# Patient Record
Sex: Male | Born: 1990 | Race: Black or African American | Marital: Married
Health system: Northeastern US, Academic
[De-identification: ages and names within clinical notes are randomized; demographics above are authoritative.]

## PROBLEM LIST (undated history)

## (undated) DIAGNOSIS — Z87891 Personal history of nicotine dependence: Secondary | ICD-10-CM

## (undated) DIAGNOSIS — J4550 Severe persistent asthma, uncomplicated: Secondary | ICD-10-CM

## (undated) DIAGNOSIS — F1210 Cannabis abuse, uncomplicated: Secondary | ICD-10-CM

## (undated) DIAGNOSIS — J45909 Unspecified asthma, uncomplicated: Secondary | ICD-10-CM

## (undated) DIAGNOSIS — J40 Bronchitis, not specified as acute or chronic: Secondary | ICD-10-CM

## (undated) DIAGNOSIS — J309 Allergic rhinitis, unspecified: Secondary | ICD-10-CM

## (undated) NOTE — ED Provider Notes (Signed)
Formatting of this note is different from the original.  Images from the original note were not included.    [place] [facility]    CSN: [id number]  Arrival date & time: 10/07/22  1757        History    Chief Complaint  Chief Complaint   Patient presents with    Exposure to STD     HPI  Wesley Wesley [name] is a 1 y.o. male.   Presents for STD testing  Denies any symptoms Wesley [name] exposures  No penile discharge, testicular pain Wesley [name] swelling, urinary symptoms    Past Medical History:   Diagnosis Date    Allergic rhinitis     Bronchitis     Hx of tobacco use, presenting hazards to health     Marijuana abuse     Severe persistent asthma      Patient Active Problem List    Diagnosis Date Noted    Enlarged thyroid 10/05/2022    Elevated blood pressure reading 10/05/2022    Asthma 09/07/2022    Non compliance with medical treatment 12/12/2016    Sinus tachycardia 09/15/2012    Marijuana abuse     Hx of tobacco use, presenting hazards to health      Past Surgical History:   Procedure Laterality Date    TONSILLECTOMY  2008     Home Medications      Prior to Admission medications    Medication Sig Start Date End Date Taking? Authorizing Provider   albuterol (PROVENTIL) (2.5 MG/3ML) 0.083% nebulizer solution Take 3 mLs (2.5 mg total) by nebulization every 6 (six) hours as needed for wheezing Wesley [name] shortness of breath. 09/07/22   McElwee, Lauren A, NP   albuterol (VENTOLIN HFA) 108 (90 Base) MCG/ACT inhaler Inhale 2 puffs into the lungs every 6 (six) hours as needed for wheezing Wesley [name] shortness of breath. 09/07/22   McElwee, Lauren A, NP   beclomethasone (QVAR REDIHALER) 80 MCG/ACT inhaler Inhale 2 puffs into the lungs 2 (two) times daily. 09/07/22   McElwee, Lauren A, NP   triamcinolone cream (KENALOG) 0.1 % Apply 1 Application topically 2 (two) times daily. 09/07/22   McElwee, Scheryl Darter, NP   promethazine (PHENERGAN) 25 MG tablet Take 1 tablet (25 mg total) by mouth every 6 (six) hours as needed for nausea Wesley [name] vomiting. 09/23/17 12/25/19  Orpah Greek, MD     Family History  Family History   Problem Relation Age of Onset    Asthma Mother     Cancer Mother         unsure    Asthma Brother      Social History  Social History     Tobacco Use    Smoking status: Never    Smokeless tobacco: Never   Vaping Use    Vaping Use: Never used   Substance Use Topics    Alcohol use: Not Currently    Drug use: Not Currently     Types: Marijuana     Comment: occasionally     Allergies    Patient has no known allergies.    Review of Systems  Review of Systems  Per HPI    Physical Exam  Triage Vital Signs  ED Triage Vitals [10/07/22 1910]   Enc Vitals Group      BP (!) 142/92      Pulse Rate 75      Resp 18      Temp 97.9 F (36.6 C)  Temp Source Oral      SpO2 98 %      Weight       Height       Head Circumference       Peak Flow       Pain Score       Pain Loc       Pain Edu?       Excl. in [place]?      No data found.    Updated Vital Signs  BP (!) 142/92 (BP Location: Left Arm)   Pulse 75   Temp 97.9 F (36.6 C) (Oral)   Resp 18   SpO2 98%     Physical Exam  Vitals and nursing note reviewed.   Constitutional:       General: He is not in acute distress.     Appearance: Normal appearance.   HENT:      Mouth/Throat:      Pharynx: Oropharynx is clear.   Cardiovascular:      Rate and Rhythm: Normal rate and regular rhythm.      Pulses: Normal pulses.   Pulmonary:      Effort: Pulmonary effort is normal.   Neurological:      Mental Status: He is alert and oriented to person, place, and time.     UC Treatments / Results   Labs  (all labs ordered are listed, but only abnormal results are displayed)  Labs Reviewed   CYTOLOGY, (ORAL, ANAL, URETHRAL) ANCILLARY ONLY     EKG    Radiology  No results found.    Procedures  Procedures (including critical care time)    Medications Ordered in UC  Medications - No data to display    Initial Impression / Assessment and Plan / UC Course   I have reviewed the triage vital signs and the nursing notes.    Pertinent labs & imaging  results that were available during my care of the patient were reviewed by me and considered in my medical decision making (see chart for details).    Cytology swab pending.   Will treat positive result as needed  No questions Wesley [name] concerns today    Final Clinical Impressions(s) / UC Diagnoses     Final diagnoses:   Screen for STD (sexually transmitted disease)     Discharge Instructions        We will call you if anything on your swab returns positive. Please abstain from sexual intercourse until your results return.    ED Prescriptions    None      PDMP not reviewed this encounter.    Rising, Vernice Jefferson  10/07/22 1955    Electronically signed by Les Pou, PA-C at 10/07/2022  7:55 PM EST

## (undated) NOTE — ED Provider Notes (Signed)
Formatting of this note is different from the original.    MRN: [id number] DOB: 14-Jul-1991    Subjective:     Wesley [name] is a 11 y.o. male presenting for an STI check. He does have unprotected sex. Denies fever, dysuria, hematuria, urinary frequency, penile discharge, penile swelling, testicular pain, testicular swelling, anal pain, groin pain.     No current facility-administered medications for this encounter.     Current Outpatient Medications:   ?  albuterol (PROVENTIL HFA;VENTOLIN HFA) 108 (90 Base) MCG/ACT inhaler, Inhale 2 puffs every 6 (six) hours as needed into the lungs for wheezing or shortness of breath., Disp: 1 Inhaler, Rfl: 0  ?  albuterol (PROVENTIL) (2.5 MG/3ML) 0.083% nebulizer solution, Take 3 mLs (2.5 mg total) every 6 (six) hours as needed by nebulization for wheezing., Disp: 75 mL, Rfl: 0  ?  budesonide (PULMICORT) 0.25 MG/2ML nebulizer solution, Take 2 mLs (0.25 mg total) by nebulization 2 (two) times daily., Disp: 60 mL, Rfl: 12  ?  ibuprofen (ADVIL,MOTRIN) 600 MG tablet, Take 1 tablet (600 mg total) every 6 (six) hours as needed by mouth., Disp: 30 tablet, Rfl: 0  ?  predniSONE (DELTASONE) 10 MG tablet, Prednisone 40 mg po daily x 1 day then Prednisone 30 mg po daily x 1 day then Prednisone 20 mg po daily x 1 day then Prednisone 10 mg daily x 1 day then stop... (Patient not taking: Reported on 10/08/2017), Disp: 10 tablet, Rfl: 0     No Known Allergies    Past Medical History:   Diagnosis Date   ? Allergic rhinitis    ? Bronchitis    ? Hx of tobacco use, presenting hazards to health    ? Marijuana abuse    ? Severe persistent asthma        Past Surgical History:   Procedure Laterality Date   ? TONSILLECTOMY  2008     Objective:     Vitals:  BP (!) 152/99 (BP Location: Left Arm)   Pulse 87   Temp 98.1 F (36.7 C) (Oral)   Resp 18   SpO2 99%     Physical Exam   Constitutional: He is oriented to person, place, and time. He appears well-developed and well-nourished.   Cardiovascular:  Normal rate.   Pulmonary/Chest: Effort normal.   Neurological: He is alert and oriented to person, place, and time.     Assessment and Plan :     Unprotected sex    Routine screening for STI (sexually transmitted infection)    STI labs pending. Patient is asymptomatic, will hold off on empiric treatment.      Jaynee Eagles, PA-C  05/07/18 1318    Electronically signed by Jaynee Eagles, PA-C at 05/07/2018  1:18 PM EDT

## (undated) NOTE — ED Triage Notes (Signed)
Formatting of this note might be different from the original.  Pt is here for his monthly STD check-up.  Denies any symptoms at this time.   Electronically signed by Phillip Heal, Demetrice A, CMA at 10/07/2022  7:13 PM EST

## (undated) NOTE — Progress Notes (Signed)
Formatting of this note is different from the original.    Virtual Visit Consent     Wesley [name], you are scheduled for a virtual visit with a [facility] provider today. Just as with appointments in the office, your consent must be obtained to participate. Your consent will be active for this visit and any virtual visit you may have with one of our providers in the next 365 days. If you have a MyChart account, a copy of this consent can be sent to you electronically.    As this is a virtual visit, video technology does not allow for your provider to perform a traditional examination. This may limit your provider's ability to fully assess your condition. If your provider identifies any concerns that need to be evaluated in person or the need to arrange testing (such as labs, EKG, etc.), we will make arrangements to do so. Although advances in technology are sophisticated, we cannot ensure that it will always work on either your end or our end. If the connection with a video visit is poor, the visit may have to be switched to a telephone visit. With either a video or telephone visit, we are not always able to ensure that we have a secure connection.    By engaging in this virtual visit, you consent to the provision of healthcare and authorize for your insurance to be billed (if applicable) for the services provided during this visit. Depending on your insurance coverage, you may receive a charge related to this service.    I need to obtain your verbal consent now. Are you willing to proceed with your visit today? Wesley [name] has provided verbal consent on 12/22/2022 for a virtual visit (video or telephone). Wesley Pounds, NP    Date: 12/22/2022 5:49 PM    Virtual Visit via Video Note     I, Wesley [name], connected with  Wesley [name]  ([id number], 06/19/1991) on 12/22/22 at  5:30 PM EST by a video-enabled telemedicine application and verified that I am speaking with the correct person using two  identifiers.    Location:  Patient: Virtual Visit Location Patient: Home  Provider: Virtual Visit Location Provider: Home Office    I discussed the limitations of evaluation and management by telemedicine and the availability of in person appointments. The patient expressed understanding and agreed to proceed.      History of Present Illness:  Wesley [name] is a 23 y.o. who identifies as a male who was assigned male at birth, and is being seen today for COVID symptoms.    Wesley [name] was diagnosed with COVID 3 days ago. He is currently  beint treated with Paxlovid but is requesting a work note due to persistent body aches mostly in his legs. Other symptoms include fatigue, chills and chest congestion. I have instructed him we will provide a note for his next 2 scheduled work days. Any additional days off would need to be approved by his PCP.       Problems:   Patient Active Problem List    Diagnosis Date Noted    Enlarged thyroid 10/05/2022    Elevated blood pressure reading 10/05/2022    Asthma 09/07/2022    Non compliance with medical treatment 12/12/2016    Sinus tachycardia 09/15/2012    Marijuana abuse     Hx of tobacco use, presenting hazards to health      Allergies: No Known Allergies  Medications:   Current Outpatient Medications:  albuterol (PROVENTIL) (2.5 MG/3ML) 0.083% nebulizer solution, Take 3 mLs (2.5 mg total) by nebulization every 6 (six) hours as needed for wheezing or shortness of breath., Disp: 75 mL, Rfl: 12    albuterol (VENTOLIN HFA) 108 (90 Base) MCG/ACT inhaler, Inhale 2 puffs into the lungs every 6 (six) hours as needed for wheezing or shortness of breath., Disp: 8 g, Rfl: 2    budesonide-formoterol (SYMBICORT) 160-4.5 MCG/ACT inhaler, Inhale 2 puffs into the lungs 2 (two) times daily., Disp: 1 each, Rfl: 12    nirmatrelvir/ritonavir (PAXLOVID) 20 x 150 MG & 10 x '100MG'$  TABS, Take 3 tablets by mouth 2 (two) times daily for 5 days. (Take nirmatrelvir 150 mg two tablets twice daily for 5 days  and ritonavir 100 mg one tablet twice daily for 5 days) Patient GFR is > 60, Disp: 30 tablet, Rfl: 0    Observations/Objective:  Patient is well-developed, well-nourished in no acute distress.   Resting comfortably at home.   Head is normocephalic, atraumatic.   No labored breathing.   Speech is clear and coherent with logical content.   Patient is alert and oriented at baseline.     Assessment and Plan:  1. COVID-19  INSTRUCTIONS: use a humidifier for nasal congestion  Drink plenty of fluids, rest and wash hands frequently to avoid the spread of infection  Alternate tylenol and Motrin for relief of fever and pain    Follow Up Instructions:  I discussed the assessment and treatment plan with the patient. The patient was provided an opportunity to ask questions and all were answered. The patient agreed with the plan and demonstrated an understanding of the instructions.  A copy of instructions were sent to the patient via MyChart unless otherwise noted below.     The patient was advised to call back or seek an in-person evaluation if the symptoms worsen or if the condition fails to improve as anticipated.    Time:   I spent 12 minutes with the patient via telehealth technology discussing the above problems/concerns.      Wesley Pounds, NP   Electronically signed by Wesley Pounds, NP at 12/22/2022  5:49 PM EST

## (undated) NOTE — ED Triage Notes (Signed)
Formatting of this note might be different from the original.  Pt requesting STD screening  Electronically signed by Nira Conn, RN at 05/07/2018 12:47 PM EDT

---
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
## 2008-07-20 ENCOUNTER — Emergency Department (HOSPITAL_COMMUNITY): Admission: EM | Admit: 2008-07-20 | Discharge: 2008-07-20 | Payer: Self-pay | Admitting: Emergency Medicine

## 2010-11-17 ENCOUNTER — Emergency Department (HOSPITAL_COMMUNITY)
Admission: EM | Admit: 2010-11-17 | Discharge: 2010-11-17 | Payer: Self-pay | Source: Home / Self Care | Admitting: Emergency Medicine

## 2011-05-19 ENCOUNTER — Emergency Department (HOSPITAL_COMMUNITY)
Admission: EM | Admit: 2011-05-19 | Discharge: 2011-05-20 | Disposition: A | Discharge status: Home / Self Care | Payer: Self-pay | Attending: Emergency Medicine | Admitting: Emergency Medicine

## 2011-05-19 DIAGNOSIS — J45909 Unspecified asthma, uncomplicated: Secondary | ICD-10-CM | POA: Insufficient documentation

## 2011-10-16 ENCOUNTER — Emergency Department (HOSPITAL_COMMUNITY): Payer: Self-pay

## 2011-10-16 ENCOUNTER — Encounter: Payer: Self-pay | Admitting: Emergency Medicine

## 2011-10-16 ENCOUNTER — Emergency Department (HOSPITAL_COMMUNITY)
Admission: EM | Admit: 2011-10-16 | Discharge: 2011-10-17 | Disposition: A | Discharge status: Home / Self Care | Payer: Self-pay | Attending: Emergency Medicine | Admitting: Emergency Medicine

## 2011-10-16 DIAGNOSIS — M549 Dorsalgia, unspecified: Secondary | ICD-10-CM | POA: Insufficient documentation

## 2011-10-16 DIAGNOSIS — R000 Tachycardia, unspecified: Secondary | ICD-10-CM | POA: Insufficient documentation

## 2011-10-16 DIAGNOSIS — F17200 Nicotine dependence, unspecified, uncomplicated: Secondary | ICD-10-CM | POA: Insufficient documentation

## 2011-10-16 DIAGNOSIS — J45901 Unspecified asthma with (acute) exacerbation: Secondary | ICD-10-CM | POA: Insufficient documentation

## 2011-10-16 DIAGNOSIS — R0602 Shortness of breath: Secondary | ICD-10-CM | POA: Insufficient documentation

## 2011-10-16 DIAGNOSIS — R059 Cough, unspecified: Secondary | ICD-10-CM | POA: Insufficient documentation

## 2011-10-16 DIAGNOSIS — R05 Cough: Secondary | ICD-10-CM | POA: Insufficient documentation

## 2011-10-16 DIAGNOSIS — R509 Fever, unspecified: Secondary | ICD-10-CM | POA: Insufficient documentation

## 2011-10-16 HISTORY — DX: Bronchitis, not specified as acute or chronic: J40

## 2011-10-16 MED ORDER — ALBUTEROL SULFATE (5 MG/ML) 0.5% IN NEBU
INHALATION_SOLUTION | RESPIRATORY_TRACT | Status: AC
  Administered 2011-10-17: 03:00:00
  Filled 2011-10-16: qty 0.5

## 2011-10-16 MED ORDER — ALBUTEROL SULFATE (5 MG/ML) 0.5% IN NEBU
5.0000 mg | INHALATION_SOLUTION | Freq: Once | RESPIRATORY_TRACT | Status: AC
  Administered 2011-10-16: 5 mg via RESPIRATORY_TRACT
  Filled 2011-10-16: qty 0.5

## 2011-10-16 NOTE — ED Notes (Signed)
Pt requesting disability letter from [place].  Also requesting pain med for his chronic back pain while receiving breathing tx in triage.

## 2011-10-16 NOTE — ED Provider Notes (Signed)
History     CSN: [id number] Arrival date & time: 10/16/2011  5:40 PM   First MD Initiated Contact with Patient 10/16/11 2036      Chief Complaint  Patient presents with  . Shortness of Breath    (Consider location/radiation/quality/duration/timing/severity/associated sxs/prior treatment) HPI Comments: Patient with Hx asthma states when he was in [place], over a year ago was on disability due to his asthma but has been in [place] for a year has no PCP and has not had any inhalers.  Now has had asthma attack/wheezing for several days has felt hot/chills for 2 days but has not taken temperature.  Has no local PCP.  Would like [place] to write a letter to get him back on disability.  He staes that when he walks he become more SOB and increases the wheezing, also complaining of mid bilateral back pain worse when he is wheezing and better if he rest in bed   Patient is a 20 y.o. male presenting with shortness of breath. The history is provided by the patient.  Shortness of Breath  The current episode started more than 2 weeks ago. The problem occurs frequently. The problem has been gradually worsening. The problem is severe. The symptoms are relieved by rest. The symptoms are aggravated by activity. Associated symptoms include a fever, cough, shortness of breath and wheezing. Pertinent negatives include no chest pain, no rhinorrhea and no sore throat. The fever has been present for 1 to 2 days. His temperature was unmeasured prior to arrival. It is unknown what precipitates the cough. The cough is non-productive. The cough is relieved by rest. The cough is worsened by activity. He has not inhaled smoke recently. He has had no prior steroid use. He has had prior hospitalizations. He has had no prior ICU admissions. He has had no prior intubations. His past medical history is significant for asthma. Urine output has been normal. There were no sick contacts. He has received no recent medical care.    Past Medical  History  Diagnosis Date  . Asthma   . Bronchitis     Past Surgical History  Procedure Date  . Tonsillectomy     No family history on file.  History  Substance Use Topics  . Smoking status: Current Everyday Smoker    Types: Cigarettes  . Smokeless tobacco: Not on file  . Alcohol Use: Yes     Occasional      Review of Systems  Constitutional: Positive for fever.  HENT: Negative for sore throat, rhinorrhea and sneezing.   Eyes: Negative.   Respiratory: Positive for cough, shortness of breath and wheezing.   Cardiovascular: Negative for chest pain.  Gastrointestinal: Negative.   Genitourinary: Negative.   Musculoskeletal: Positive for back pain.  Skin: Negative.   Neurological: Negative for dizziness, weakness and numbness.  Hematological: Negative.   Psychiatric/Behavioral: Negative.     Allergies  Review of patient's allergies indicates no known allergies.  Home Medications   Current Outpatient Rx  Name Route Sig Dispense Refill  . ALBUTEROL SULFATE HFA 108 (90 BASE) MCG/ACT IN AERS Inhalation Inhale 2 puffs into the lungs every 6 (six) hours as needed. For shortness of breath       BP 156/85  Pulse 115  Temp(Src) 99.7 F (37.6 C) (Oral)  Resp 20  SpO2 90%  Physical Exam  Constitutional: He is oriented to person, place, and time. He appears well-developed.  HENT:  Head: Normocephalic.  Eyes: EOM are normal.  Neck: Neck  supple.  Cardiovascular: Tachycardia present.   Pulmonary/Chest: Effort normal and breath sounds normal. No respiratory distress. He has no wheezes.       Patient examined after neb treatment   Abdominal: Soft.  Musculoskeletal: Normal range of motion.  Neurological: He is oriented to person, place, and time.  Skin: Skin is warm and dry.  Psychiatric: He has a normal mood and affect.    ED Course  Procedures (including critical care time)  Labs Reviewed - No data to display No results found.   No diagnosis found.  X-ray  reveals no pneumonia at this time, but today.  Patient continues to have O2 sat of 90% at rest with tachycardia and low-grade fever.  Will admit to CDU under asthma protocol and reevaluate in 12-24 hours  MDM  Will obtain xray to evaluate for pneumonia which is most likely in this patient with sever untreated asthma         Arman Filter, NP 10/17/11 [phone]

## 2011-10-16 NOTE — ED Notes (Signed)
Pt 02 sats 95% on RA after breathing tx.

## 2011-10-16 NOTE — ED Notes (Signed)
Pt receiving HHN in triage.

## 2011-10-16 NOTE — ED Notes (Signed)
Pt c/o asthma attack onset last pm.  St's he is suppose to have Inhalers but does not have any.

## 2011-10-17 LAB — CBC
MCH: 29.9 pg (ref 26.0–34.0)
MCHC: 36.2 g/dL — ABNORMAL HIGH (ref 30.0–36.0)
MCV: 82.6 fL (ref 78.0–100.0)
RDW: 12.7 % (ref 11.5–15.5)

## 2011-10-17 LAB — POCT I-STAT, CHEM 8
Calcium, Ion: 1.1 mmol/L — ABNORMAL LOW (ref 1.12–1.32)
Chloride: 101 mEq/L (ref 96–112)
HCT: 50 % (ref 39.0–52.0)
Potassium: 3.3 mEq/L — ABNORMAL LOW (ref 3.5–5.1)
Sodium: 136 mEq/L (ref 135–145)

## 2011-10-17 LAB — DIFFERENTIAL
Basophils Relative: 0 % (ref 0–1)
Eosinophils Relative: 0 % (ref 0–5)
Monocytes Relative: 17 % — ABNORMAL HIGH (ref 3–12)
Neutrophils Relative %: 75 % (ref 43–77)

## 2011-10-17 MED ORDER — SODIUM CHLORIDE 0.9 % IV BOLUS (SEPSIS)
500.0000 mL | Freq: Once | INTRAVENOUS | Status: AC
  Administered 2011-10-17: 1000 mL via INTRAVENOUS

## 2011-10-17 MED ORDER — PREDNISONE 20 MG PO TABS
60.0000 mg | ORAL_TABLET | Freq: Once | ORAL | Status: AC
  Administered 2011-10-17: 60 mg via ORAL
  Filled 2011-10-17: qty 3

## 2011-10-17 MED ORDER — ALBUTEROL SULFATE HFA 108 (90 BASE) MCG/ACT IN AERS
2.0000 | INHALATION_SPRAY | Freq: Once | RESPIRATORY_TRACT | Status: AC
  Administered 2011-10-17: 2 via RESPIRATORY_TRACT
  Filled 2011-10-17: qty 6.7

## 2011-10-17 MED ORDER — PREDNISONE 20 MG PO TABS: 40.0000 mg | ORAL_TABLET | Freq: Every day | ORAL | Status: AC

## 2011-10-17 MED ORDER — ACETAMINOPHEN 325 MG PO TABS
650.0000 mg | ORAL_TABLET | Freq: Once | ORAL | Status: AC
  Administered 2011-10-17: 650 mg via ORAL
  Filled 2011-10-17: qty 2

## 2011-10-17 MED ORDER — ACETAMINOPHEN 325 MG PO TABS
650.0000 mg | ORAL_TABLET | Freq: Once | ORAL | Status: AC
  Administered 2011-10-17: 650 mg via ORAL

## 2011-10-17 MED ORDER — ALBUTEROL SULFATE (5 MG/ML) 0.5% IN NEBU: 2.5000 mg | INHALATION_SOLUTION | Freq: Once | RESPIRATORY_TRACT | Status: DC

## 2011-10-17 MED ORDER — IPRATROPIUM BROMIDE 0.02 % IN SOLN
0.5000 mg | Freq: Once | RESPIRATORY_TRACT | Status: AC
  Administered 2011-10-17: 0.5 mg via RESPIRATORY_TRACT
  Filled 2011-10-17: qty 2.5

## 2011-10-17 MED ORDER — ALBUTEROL SULFATE (5 MG/ML) 0.5% IN NEBU
5.0000 mg | INHALATION_SOLUTION | RESPIRATORY_TRACT | Status: DC | PRN
  Administered 2011-10-17: 5 mg via RESPIRATORY_TRACT
  Filled 2011-10-17: qty 1

## 2011-10-17 MED ORDER — SODIUM CHLORIDE 0.9 % IV SOLN: INTRAVENOUS | Status: DC

## 2011-10-17 MED ORDER — ACETAMINOPHEN 325 MG PO TABS
ORAL_TABLET | ORAL | Status: AC
  Filled 2011-10-17: qty 2

## 2011-10-17 MED ORDER — ALBUTEROL SULFATE (5 MG/ML) 0.5% IN NEBU
5.0000 mg | INHALATION_SOLUTION | Freq: Once | RESPIRATORY_TRACT | Status: AC
  Administered 2011-10-17: 5 mg via RESPIRATORY_TRACT
  Filled 2011-10-17: qty 1

## 2011-10-17 NOTE — ED Provider Notes (Signed)
Medical screening examination/treatment/procedure(s) were performed by non-physician practitioner and as supervising physician I was immediately available for consultation/collaboration.    Nelia Shi, MD 10/17/11 [phone]

## 2011-10-17 NOTE — Progress Notes (Signed)
Observation review completed. 

## 2011-10-17 NOTE — ED Notes (Signed)
C/o h/a. Pa aware.

## 2011-10-17 NOTE — ED Notes (Signed)
Pt placed on cardiac monitor, and continuous pulse ox.  Plan of care explained to pt.  Pt voices understanding.

## 2011-10-18 ENCOUNTER — Emergency Department (HOSPITAL_COMMUNITY)
Admission: EM | Admit: 2011-10-18 | Discharge: 2011-10-19 | Disposition: A | Discharge status: Home / Self Care | Payer: Self-pay | Attending: Emergency Medicine | Admitting: Emergency Medicine

## 2011-10-18 ENCOUNTER — Encounter (HOSPITAL_COMMUNITY): Payer: Self-pay | Admitting: *Deleted

## 2011-10-18 DIAGNOSIS — R5383 Other fatigue: Secondary | ICD-10-CM | POA: Insufficient documentation

## 2011-10-18 DIAGNOSIS — J45909 Unspecified asthma, uncomplicated: Secondary | ICD-10-CM | POA: Insufficient documentation

## 2011-10-18 DIAGNOSIS — R509 Fever, unspecified: Secondary | ICD-10-CM | POA: Insufficient documentation

## 2011-10-18 DIAGNOSIS — R51 Headache: Secondary | ICD-10-CM | POA: Insufficient documentation

## 2011-10-18 DIAGNOSIS — J069 Acute upper respiratory infection, unspecified: Secondary | ICD-10-CM | POA: Insufficient documentation

## 2011-10-18 DIAGNOSIS — R05 Cough: Secondary | ICD-10-CM | POA: Insufficient documentation

## 2011-10-18 DIAGNOSIS — R5381 Other malaise: Secondary | ICD-10-CM | POA: Insufficient documentation

## 2011-10-18 DIAGNOSIS — R059 Cough, unspecified: Secondary | ICD-10-CM | POA: Insufficient documentation

## 2011-10-18 LAB — URINALYSIS, ROUTINE W REFLEX MICROSCOPIC
Glucose, UA: NEGATIVE mg/dL
Hgb urine dipstick: NEGATIVE
Ketones, ur: 15 mg/dL — AB
Leukocytes, UA: NEGATIVE
Nitrite: NEGATIVE
Protein, ur: 30 mg/dL — AB
Specific Gravity, Urine: 1.038 — ABNORMAL HIGH (ref 1.005–1.030)
Urobilinogen, UA: 1 mg/dL (ref 0.0–1.0)
pH: 6 (ref 5.0–8.0)

## 2011-10-18 LAB — URINE MICROSCOPIC-ADD ON

## 2011-10-18 NOTE — ED Notes (Signed)
Temp aching all over headache n v  For 3-4 days.  He was seen here yesterday for his asthma

## 2011-10-19 MED ORDER — GUAIFENESIN-CODEINE 100-10 MG/5ML PO SYRP: 5.0000 mL | ORAL_SOLUTION | Freq: Four times a day (QID) | ORAL | Status: AC | PRN

## 2011-10-19 MED ORDER — IBUPROFEN 200 MG PO TABS
400.0000 mg | ORAL_TABLET | Freq: Once | ORAL | Status: AC
  Administered 2011-10-19: 400 mg via ORAL
  Filled 2011-10-19: qty 2

## 2011-10-19 MED ORDER — ALBUTEROL SULFATE (5 MG/ML) 0.5% IN NEBU
5.0000 mg | INHALATION_SOLUTION | Freq: Once | RESPIRATORY_TRACT | Status: AC
  Administered 2011-10-19: 5 mg via RESPIRATORY_TRACT
  Filled 2011-10-19: qty 1

## 2011-10-19 MED ORDER — IPRATROPIUM BROMIDE 0.02 % IN SOLN
0.5000 mg | Freq: Once | RESPIRATORY_TRACT | Status: AC
  Administered 2011-10-19: 0.5 mg via RESPIRATORY_TRACT
  Filled 2011-10-19: qty 2.5

## 2011-10-19 MED ORDER — PREDNISONE 20 MG PO TABS
50.0000 mg | ORAL_TABLET | Freq: Once | ORAL | Status: AC
  Administered 2011-10-19: 50 mg via ORAL
  Filled 2011-10-19: qty 3

## 2011-10-19 NOTE — ED Provider Notes (Signed)
History    19yM with persistent cough. Also subjective fever, HA and general malaise. No n/v/d. No diarrhea. No rash. Just seen in ED and diagnosed with asthma exac. Did not fill prescriptions though. Otherwise healthy aside from asthma.   CSN: [id number] Arrival date & time: 10/18/2011 10:28 PM   First MD Initiated Contact with Patient 10/19/11 0054      Chief Complaint  Patient presents with  . Fever    (Consider location/radiation/quality/duration/timing/severity/associated sxs/prior treatment) HPI  Past Medical History  Diagnosis Date  . Asthma   . Bronchitis     Past Surgical History  Procedure Date  . Tonsillectomy     History reviewed. No pertinent family history.  History  Substance Use Topics  . Smoking status: Current Everyday Smoker    Types: Cigarettes  . Smokeless tobacco: Not on file  . Alcohol Use: Yes     Occasional      Review of Systems   Review of symptoms negative unless otherwise noted in HPI.   Allergies  Review of patient's allergies indicates no known allergies.  Home Medications   Current Outpatient Rx  Name Route Sig Dispense Refill  . ALBUTEROL SULFATE HFA 108 (90 BASE) MCG/ACT IN AERS Inhalation Inhale 2 puffs into the lungs every 6 (six) hours as needed. For shortness of breath     . PREDNISONE 20 MG PO TABS Oral Take 2 tablets (40 mg total) by mouth daily. 10 tablet 0    BP 129/82  Pulse 95  Temp(Src) 98.6 F (37 C) (Oral)  Resp 22  SpO2 94%  Physical Exam  Nursing note and vitals reviewed. Constitutional: He appears well-developed and well-nourished. No distress.  HENT:  Head: Normocephalic and atraumatic.  Right Ear: External ear normal.  Left Ear: External ear normal.  Mouth/Throat: Oropharynx is clear and moist.  Eyes: Conjunctivae are normal. Right eye exhibits no discharge. Left eye exhibits no discharge.  Neck: Normal range of motion. Neck supple.  Cardiovascular: Normal rate, regular rhythm and normal  heart sounds.  Exam reveals no gallop and no friction rub.   No murmur heard. Pulmonary/Chest: Effort normal and breath sounds normal. No stridor. No respiratory distress.       Occasional scattered wheezing  Abdominal: Soft. He exhibits no distension. There is no tenderness.  Musculoskeletal: He exhibits no edema and no tenderness.  Lymphadenopathy:    He has no cervical adenopathy.  Neurological: He is alert.  Skin: Skin is warm and dry. He is not diaphoretic.  Psychiatric: He has a normal mood and affect. His behavior is normal. Thought content normal.    ED Course  Procedures (including critical care time)  Labs Reviewed  URINALYSIS, ROUTINE W REFLEX MICROSCOPIC - Abnormal; Notable for the following:    Color, Urine AMBER (*) BIOCHEMICALS MAY BE AFFECTED BY COLOR   Specific Gravity, Urine 1.038 (*)    Bilirubin Urine SMALL (*)    Ketones, ur 15 (*)    Protein, ur 30 (*)    All other components within normal limits  URINE MICROSCOPIC-ADD ON - Abnormal; Notable for the following:    Squamous Epithelial / LPF FEW (*)    All other components within normal limits   No results found.   1. Upper respiratory infection       MDM  19yM with persistent cough. Recently seen in ED with short stay in CDU under asthma protocol. Clear on exam but c/o wheezing and requesting neb. Pt given prescriptions on last visit  but did not fill. Will give another dose in ED. Pt encouraged to fill meds. CXR on last visit clear. Do not think needs re-imaged at this time without appreciable change in symptoms. Suspect viral URI/asthma exac. Plan symptomatic tx.         Raeford Razor, MD 10/22/11 [phone]

## 2012-09-15 ENCOUNTER — Encounter (HOSPITAL_COMMUNITY): Payer: Self-pay | Admitting: Emergency Medicine

## 2012-09-15 ENCOUNTER — Observation Stay (HOSPITAL_COMMUNITY)
Admission: EM | Admit: 2012-09-15 | Discharge: 2012-09-17 | Disposition: A | Discharge status: Home / Self Care | Payer: Self-pay | Attending: Internal Medicine | Admitting: Internal Medicine

## 2012-09-15 ENCOUNTER — Emergency Department (HOSPITAL_COMMUNITY): Payer: Self-pay

## 2012-09-15 DIAGNOSIS — Z87891 Personal history of nicotine dependence: Secondary | ICD-10-CM

## 2012-09-15 DIAGNOSIS — J309 Allergic rhinitis, unspecified: Secondary | ICD-10-CM | POA: Diagnosis present

## 2012-09-15 DIAGNOSIS — R0902 Hypoxemia: Secondary | ICD-10-CM | POA: Insufficient documentation

## 2012-09-15 DIAGNOSIS — J4550 Severe persistent asthma, uncomplicated: Secondary | ICD-10-CM | POA: Diagnosis present

## 2012-09-15 DIAGNOSIS — J45909 Unspecified asthma, uncomplicated: Secondary | ICD-10-CM

## 2012-09-15 DIAGNOSIS — J9601 Acute respiratory failure with hypoxia: Secondary | ICD-10-CM

## 2012-09-15 DIAGNOSIS — R0602 Shortness of breath: Secondary | ICD-10-CM

## 2012-09-15 DIAGNOSIS — F1210 Cannabis abuse, uncomplicated: Secondary | ICD-10-CM | POA: Diagnosis present

## 2012-09-15 DIAGNOSIS — J209 Acute bronchitis, unspecified: Secondary | ICD-10-CM | POA: Diagnosis present

## 2012-09-15 DIAGNOSIS — Z23 Encounter for immunization: Secondary | ICD-10-CM | POA: Insufficient documentation

## 2012-09-15 DIAGNOSIS — F17200 Nicotine dependence, unspecified, uncomplicated: Secondary | ICD-10-CM | POA: Insufficient documentation

## 2012-09-15 DIAGNOSIS — R000 Tachycardia, unspecified: Secondary | ICD-10-CM

## 2012-09-15 DIAGNOSIS — J45902 Unspecified asthma with status asthmaticus: Principal | ICD-10-CM

## 2012-09-15 HISTORY — DX: Personal history of nicotine dependence: Z87.891

## 2012-09-15 HISTORY — DX: Severe persistent asthma, uncomplicated: J45.50

## 2012-09-15 HISTORY — DX: Allergic rhinitis, unspecified: J30.9

## 2012-09-15 HISTORY — DX: Cannabis abuse, uncomplicated: F12.10

## 2012-09-15 LAB — MRSA PCR SCREENING: MRSA by PCR: NEGATIVE

## 2012-09-15 LAB — CBC WITH DIFFERENTIAL/PLATELET
Basophils Relative: 0 % (ref 0–1)
Eosinophils Absolute: 0 10*3/uL (ref 0.0–0.7)
MCH: 29.4 pg (ref 26.0–34.0)
MCHC: 35 g/dL (ref 30.0–36.0)
Neutro Abs: 6.9 10*3/uL (ref 1.7–7.7)
Neutrophils Relative %: 97 % — ABNORMAL HIGH (ref 43–77)
Platelets: 191 10*3/uL (ref 150–400)
RBC: 5.34 MIL/uL (ref 4.22–5.81)

## 2012-09-15 LAB — BASIC METABOLIC PANEL
BUN: 13 mg/dL (ref 6–23)
Calcium: 9.8 mg/dL (ref 8.4–10.5)
GFR calc Af Amer: >90 mL/min (ref >90)
GFR calc non Af Amer: >90 mL/min (ref >90)
Glucose, Bld: 141 mg/dL — ABNORMAL HIGH (ref 70–99)
Sodium: 137 mEq/L (ref 135–145)

## 2012-09-15 MED ORDER — MORPHINE SULFATE 4 MG/ML IJ SOLN: 4.0000 mg | Freq: Once | INTRAMUSCULAR | Status: DC

## 2012-09-15 MED ORDER — ZOLPIDEM TARTRATE 5 MG PO TABS
5.0000 mg | ORAL_TABLET | Freq: Every evening | ORAL | Status: DC | PRN
  Administered 2012-09-15 – 2012-09-16 (×2): 5 mg via ORAL
  Filled 2012-09-15 (×2): qty 1

## 2012-09-15 MED ORDER — SODIUM CHLORIDE 0.9 % IV BOLUS (SEPSIS)
1000.0000 mL | Freq: Once | INTRAVENOUS | Status: AC
  Administered 2012-09-15: 1000 mL via INTRAVENOUS

## 2012-09-15 MED ORDER — BUDESONIDE 0.5 MG/2ML IN SUSP
0.5000 mg | Freq: Two times a day (BID) | RESPIRATORY_TRACT | Status: DC
  Administered 2012-09-16: 0.5 mg via RESPIRATORY_TRACT
  Filled 2012-09-15 (×4): qty 2

## 2012-09-15 MED ORDER — ALBUTEROL (5 MG/ML) CONTINUOUS INHALATION SOLN: 10.0000 mg/h | INHALATION_SOLUTION | RESPIRATORY_TRACT | Status: DC

## 2012-09-15 MED ORDER — FLUTICASONE PROPIONATE 50 MCG/ACT NA SUSP
2.0000 | Freq: Every day | NASAL | Status: DC
  Administered 2012-09-16: 2 via NASAL
  Filled 2012-09-15: qty 16

## 2012-09-15 MED ORDER — MAGNESIUM SULFATE 40 MG/ML IJ SOLN
2.0000 g | Freq: Once | INTRAMUSCULAR | Status: AC
  Administered 2012-09-15: 2 g via INTRAVENOUS
  Filled 2012-09-15: qty 50

## 2012-09-15 MED ORDER — ENOXAPARIN SODIUM 40 MG/0.4ML ~~LOC~~ SOLN
40.0000 mg | SUBCUTANEOUS | Status: DC
  Administered 2012-09-15 – 2012-09-16 (×2): 40 mg via SUBCUTANEOUS
  Filled 2012-09-15 (×3): qty 0.4

## 2012-09-15 MED ORDER — PREDNISONE 20 MG PO TABS
60.0000 mg | ORAL_TABLET | Freq: Once | ORAL | Status: AC
  Administered 2012-09-15: 60 mg via ORAL
  Filled 2012-09-15: qty 3

## 2012-09-15 MED ORDER — ONDANSETRON HCL 4 MG/2ML IJ SOLN: 4.0000 mg | Freq: Four times a day (QID) | INTRAMUSCULAR | Status: DC | PRN

## 2012-09-15 MED ORDER — ACETAMINOPHEN 325 MG PO TABS
650.0000 mg | ORAL_TABLET | Freq: Four times a day (QID) | ORAL | Status: DC | PRN
  Administered 2012-09-15 – 2012-09-16 (×3): 650 mg via ORAL
  Filled 2012-09-15 (×3): qty 2

## 2012-09-15 MED ORDER — METHYLPREDNISOLONE SODIUM SUCC 125 MG IJ SOLR
60.0000 mg | Freq: Two times a day (BID) | INTRAMUSCULAR | Status: DC
  Administered 2012-09-15 – 2012-09-16 (×2): 60 mg via INTRAVENOUS
  Filled 2012-09-15 (×4): qty 0.96
  Filled 2012-09-15: qty 2

## 2012-09-15 MED ORDER — ONDANSETRON HCL 4 MG PO TABS: 4.0000 mg | ORAL_TABLET | Freq: Four times a day (QID) | ORAL | Status: DC | PRN

## 2012-09-15 MED ORDER — ONDANSETRON HCL 4 MG/2ML IJ SOLN: 4.0000 mg | Freq: Three times a day (TID) | INTRAMUSCULAR | Status: DC | PRN

## 2012-09-15 MED ORDER — INFLUENZA VIRUS VACC SPLIT PF IM SUSP
0.5000 mL | INTRAMUSCULAR | Status: AC
  Administered 2012-09-16: 0.5 mL via INTRAMUSCULAR
  Filled 2012-09-15: qty 0.5

## 2012-09-15 MED ORDER — PANTOPRAZOLE SODIUM 40 MG PO TBEC
40.0000 mg | DELAYED_RELEASE_TABLET | Freq: Every day | ORAL | Status: DC
  Administered 2012-09-16 – 2012-09-17 (×2): 40 mg via ORAL
  Filled 2012-09-15: qty 1

## 2012-09-15 MED ORDER — DOCUSATE SODIUM 100 MG PO CAPS
100.0000 mg | ORAL_CAPSULE | Freq: Two times a day (BID) | ORAL | Status: DC
  Administered 2012-09-15 – 2012-09-16 (×3): 100 mg via ORAL
  Filled 2012-09-15 (×4): qty 1

## 2012-09-15 MED ORDER — ALBUTEROL SULFATE (5 MG/ML) 0.5% IN NEBU
2.5000 mg | INHALATION_SOLUTION | RESPIRATORY_TRACT | Status: DC
  Administered 2012-09-15 – 2012-09-16 (×7): 2.5 mg via RESPIRATORY_TRACT
  Filled 2012-09-15 (×6): qty 0.5

## 2012-09-15 MED ORDER — ALBUTEROL SULFATE (5 MG/ML) 0.5% IN NEBU
5.0000 mg | INHALATION_SOLUTION | Freq: Once | RESPIRATORY_TRACT | Status: AC
  Administered 2012-09-15: 5 mg via RESPIRATORY_TRACT

## 2012-09-15 MED ORDER — ACETAMINOPHEN 325 MG PO TABS
650.0000 mg | ORAL_TABLET | Freq: Once | ORAL | Status: AC
  Administered 2012-09-15: 650 mg via ORAL
  Filled 2012-09-15: qty 2

## 2012-09-15 MED ORDER — METHYLPREDNISOLONE SODIUM SUCC 125 MG IJ SOLR
125.0000 mg | Freq: Once | INTRAMUSCULAR | Status: AC
  Administered 2012-09-15: 125 mg via INTRAVENOUS
  Filled 2012-09-15: qty 2

## 2012-09-15 MED ORDER — HYDROCODONE-ACETAMINOPHEN 5-325 MG PO TABS
1.0000 | ORAL_TABLET | Freq: Four times a day (QID) | ORAL | Status: DC | PRN
  Administered 2012-09-16 (×2): 2 via ORAL
  Administered 2012-09-16: 1 via ORAL
  Administered 2012-09-17 (×2): 2 via ORAL
  Filled 2012-09-15: qty 1
  Filled 2012-09-15 (×4): qty 2

## 2012-09-15 MED ORDER — ACETAMINOPHEN 650 MG RE SUPP: 650.0000 mg | Freq: Four times a day (QID) | RECTAL | Status: DC | PRN

## 2012-09-15 MED ORDER — ALBUTEROL SULFATE (5 MG/ML) 0.5% IN NEBU
INHALATION_SOLUTION | RESPIRATORY_TRACT | Status: AC
  Filled 2012-09-15: qty 0.5

## 2012-09-15 MED ORDER — ALBUTEROL (5 MG/ML) CONTINUOUS INHALATION SOLN
10.0000 mg/h | INHALATION_SOLUTION | Freq: Once | RESPIRATORY_TRACT | Status: AC
  Administered 2012-09-15: 10 mg/h via RESPIRATORY_TRACT
  Filled 2012-09-15: qty 20

## 2012-09-15 MED ORDER — IPRATROPIUM BROMIDE 0.02 % IN SOLN
0.5000 mg | RESPIRATORY_TRACT | Status: AC
  Administered 2012-09-15 – 2012-09-16 (×6): 0.5 mg via RESPIRATORY_TRACT
  Filled 2012-09-15 (×6): qty 2.5

## 2012-09-15 MED ORDER — SODIUM CHLORIDE 0.9 % IV SOLN
INTRAVENOUS | Status: DC
  Administered 2012-09-15: 21:00:00 via INTRAVENOUS
  Administered 2012-09-16: 1000 mL via INTRAVENOUS
  Administered 2012-09-16: 04:00:00 via INTRAVENOUS

## 2012-09-15 MED ORDER — PNEUMOCOCCAL VAC POLYVALENT 25 MCG/0.5ML IJ INJ
0.5000 mL | INJECTION | INTRAMUSCULAR | Status: AC
  Administered 2012-09-16: 0.5 mL via INTRAMUSCULAR
  Filled 2012-09-15: qty 0.5

## 2012-09-15 MED ORDER — SODIUM CHLORIDE 0.9 % IJ SOLN
3.0000 mL | Freq: Two times a day (BID) | INTRAMUSCULAR | Status: DC
  Administered 2012-09-15 – 2012-09-17 (×3): 3 mL via INTRAVENOUS

## 2012-09-15 MED ORDER — LORAZEPAM 2 MG/ML IJ SOLN: 1.0000 mg | Freq: Once | INTRAMUSCULAR | Status: DC

## 2012-09-15 MED ORDER — ALBUTEROL (5 MG/ML) CONTINUOUS INHALATION SOLN
10.0000 mg/h | INHALATION_SOLUTION | RESPIRATORY_TRACT | Status: DC
  Filled 2012-09-15: qty 20

## 2012-09-15 MED ORDER — MONTELUKAST SODIUM 10 MG PO TABS
10.0000 mg | ORAL_TABLET | Freq: Every day | ORAL | Status: DC
  Administered 2012-09-15 – 2012-09-16 (×2): 10 mg via ORAL
  Filled 2012-09-15 (×3): qty 1

## 2012-09-15 MED ORDER — ONDANSETRON HCL 4 MG/2ML IJ SOLN
4.0000 mg | Freq: Once | INTRAMUSCULAR | Status: AC
  Administered 2012-09-15: 4 mg via INTRAVENOUS
  Filled 2012-09-15: qty 2

## 2012-09-15 NOTE — ED Notes (Signed)
Neb treatment PTA per [facility].

## 2012-09-15 NOTE — ED Notes (Signed)
Patient brought in via [facility] with complaints of asthma since last PM history of the same out of inhalers and patient advises that he is unable to afford medication.

## 2012-09-15 NOTE — H&P (Addendum)
[facility] History and Physical  Steven [name] WUJ:[id number] DOB: Jun 17, 1991 DOA: 09/15/2012  Referring physician: Wannetta Sender PCP: Sheila Oats, MD   Chief Complaint: shortness of breath  HPI:   The patient is a 21 yo male with hx of asthma who presents with SOB.  He states the he has had asthma since hew as a little kid.  He has had 4-5 admissions to the hospital for asthma exacerbations over his lifetime.  His last admission was 2-3 months ago at a hospital in [place].  He is unsure if he was ever in the ICU, but denies needing to be intubated.  He was feeling well until the weather change a 2-3 days ago and he started sneezing and coughing.  He started feeling Steven [name] of breath last night, but did not want to the come to the emergency department.  Today, he was walking down the street and became so Steven [name] of breath that he had to stop and sit on the curb and catch his breath.  He has not had an albuterol inhaler in many months and cannot remember the last time he was on controller medications.  He had his friend call 911.  He has had increased sneezing, sinus congestion, cough which is productive of clear sputum.  Denies fevers.  Denies sick contacts.  He currently has substernal chest tightness consistent with his previous asthma exacerbations.   Asthma baseline: Daytime cough: daily Nighttime cough:  5-7/week Albuterol use:  Daily use when he has it.  Ran out about 1-2 months ago and occasionally buys inhalers off the street Exercise limitations:  Severe limitations 2/2 SOB, wheezy Triggers:  Allergies, colds, cigarette smoke, weather change  In the ER, he was given solumedrol 125mg  and started on continuous albuterol 10mg  x 1 hour.  He required another albuterol treatment an hour later, 5 mg.  An hour later, he again required continuous albuterol 10mg  x 1 hour.  Afterwards, he continued to be tachypneic to the high 20s with oxygen saturations in the low 90s on 2L Steven [name] and was  "afraid to fall asleep."    Review of Systems:   Denies fevers, chills, but has headaches often.  Denies changes in hearing and vision, sore throat, chest pain, nausea, vomiting, diarrhea, constipation, blood in stools, blood in urine, pain with urination, rashes, lymphadenopathy, focal numbness/weakness.  He has occasional pain in his fingers.     Past Medical History  Diagnosis Date  . Severe persistent asthma   . Bronchitis   . Febrile seizure   . Allergic rhinitis   . Marijuana abuse   . Hx of tobacco use, presenting hazards to health    Past Surgical History  Procedure Date  . Tonsillectomy 2008   Social History:  reports that he quit smoking about 9 months ago. His smoking use included Cigarettes. He does not have any smokeless tobacco history on file. He reports that he drinks about .6 ounces of alcohol per week. He reports that he uses illicit drugs. He recently moved to [place] from [place] to "get away from badness."  His mother is dead and his father is no longer in his life.  He has a sister in the area.    No Known Allergies  Family History  Problem Relation Age of Onset  . Asthma Mother   . Cancer Mother   . Asthma Brother    Denies family history of allergic rhinitis and seasonal allergy.    Prior to Admission medications   Medication  Sig Start Date End Date Taking? Authorizing Provider  albuterol (PROVENTIL HFA;VENTOLIN HFA) 108 (90 BASE) MCG/ACT inhaler Inhale 2 puffs into the lungs every 6 (six) hours as needed. For shortness of breath     Historical Provider, MD   Physical Exam: Filed Vitals:   09/15/12 1516 09/15/12 1546 09/15/12 1707 09/15/12 1827  BP:  133/57 124/58 133/64  Pulse:  129    Temp:  98.8 F (37.1 C) 98.4 F (36.9 C)   TempSrc:  Oral Oral   Resp:  19 28 28   SpO2: 94% 96% 90% 91%     General:  Steven [name], moderate respiratory distress with supraclavicular and suprasternal and subcostal retractions  Eyes: PERRL, anicteric,  noninjected  ENT: NCAT, nares congested, MMM, tonsils nonerythematous, no exudate or plaques  Neck: supple, no thyromegaly  Lymph:  No cervical or supraclavicular LAD  Cardiovascular: Tachycardic, regular rhythm, 1/6 vibratory murmur across the precordium, 2+ pulses  Respiratory:  I:E 1:6, diminished throughout with full expiratory high pitched wheeze, no focal rales or rhonchi  Abdomen: NABS, soft, nondistended, nontender, no organomegaly  Skin:  Acne on back  Musculoskeletal:  Normal tone and bulk, no LEE  Psychiatric: A&O x 4  Neurologic: III-XII grossly intact, strength 5/5 throughout, sensation intact to light touch  Labs on Admission:  Basic Metabolic Panel: No results found for this basename: NA:5,K:5,CL:5,CO2:5,GLUCOSE:5,BUN:5,CREATININE:5,CALCIUM:5,MG:5,PHOS:5 in the last 168 hours Liver Function Tests: No results found for this basename: AST:5,ALT:5,ALKPHOS:5,BILITOT:5,PROT:5,ALBUMIN:5 in the last 168 hours No results found for this basename: LIPASE:5,AMYLASE:5 in the last 168 hours No results found for this basename: AMMONIA:5 in the last 168 hours CBC: No results found for this basename: WBC:5,NEUTROABS:5,HGB:5,HCT:5,MCV:5,PLT:5 in the last 168 hours Cardiac Enzymes: No results found for this basename: CKTOTAL:5,CKMB:5,CKMBINDEX:5,TROPONINI:5 in the last 168 hours  BNP (last 3 results) No results found for this basename: PROBNP:3 in the last 8760 hours CBG: No results found for this basename: GLUCAP:5 in the last 168 hours  Radiological Exams on Admission: Dg Chest 2 View  09/15/2012  *RADIOLOGY REPORT*  Clinical Data: As nine productive cough.  Mid chest pain.  CHEST - 2 VIEW  Comparison: Two-view chest 10/16/2011.  Findings: Mild perihilar bronchitic changes are similar to the prior exam.  The heart size is normal.  No focal airspace consolidation is evident.  The visualized soft tissues and bony thorax are unremarkable.  IMPRESSION:  1.  Mild perihilar  bronchitic changes are similar to the prior exam.  These may be chronic and related to asthma.  An element of acute bronchitis is not excluded. 2.  No focal airspace consolidation to suggest bronchopneumonia.   Original Report Authenticated By: Jamesetta Orleans. MATTERN, M.D.     EKG: Sinus tachycardia  Assessment/Plan Active Problems:  Severe persistent asthma  Allergic rhinitis  Status asthmaticus  Acute respiratory failure with hypoxia  Sinus tachycardia  Marijuana abuse  Hx of tobacco use, presenting hazards to health   Acute respiratory failure with hypoxia secondary to status asthmaticus.  Patient has severe persistent asthma symptoms according to history but relatively few hospitalizations.  No evidence of pneumonia or cardiomegaly on CXR to suggest heart failure or infection.  Despite no recent use of albuterol, he required frequent albuterol in the emergency department and peak flows remained in the 30s.   -  Admit to stepdown -  Telemetry with pulse oximetry -  Continue oxygen via nasal canula -  Solumedrol 60mg  IV q12h -  PPI prophylaxis -  Albuterol 10mg /h continuous and plan to  DC in AM and space to q2h or q3h as tolerated -  Ipra 0.5mg  neb q4h x 1 day -  Consider addition of antibiotics if not improving -  Start flonase and singulair -  Add advair after d/c continuous albuterol -  Magnesium 2gm x 1 -  Check electrolytes while on continuous albuterol to monitor potassium  Allergic rhinitis -  Flonase and singulair, but he will likely not be able to afford these medications as discharge.  $4 list medications if possible  Sinus tachycardia, common side effect of albuterol.  Monitor only and continue hydration  Substance and tobacco use.   -  Counseled cessation  DIET:  Regular ACCESS:  PIV IVF:  NS at 112ml/h overnight PROPH:  lovenox  Code Status: Full code Family Communication: spoke with patient Disposition Plan: Needs social work to provide information about  insurance, case management to provide information about primary care doctors in the area.  Please try to provide some medications at discharge and medications on $4 list.  Needs close follow up to prevent rehospitalization  Time spent: 62  Juanna Pudlo, [facility] Pager [phone]  If 7PM-7AM, please contact night-coverage [email or web address] Password [facility] 09/15/2012, 6:44 PM

## 2012-09-15 NOTE — ED Notes (Signed)
RESP THERAPY IN TO START TX. PT ALSO GIVEN HUMIDIFIER FOR NASAL CANNULA.

## 2012-09-15 NOTE — ED Provider Notes (Signed)
History     CSN: [id number]  Arrival date & time 09/15/12  7829   First MD Initiated Contact with Patient 09/15/12 0935      Chief Complaint  Patient presents with  . Asthma    (Consider location/radiation/quality/duration/timing/severity/associated sxs/prior treatment) Patient is a 21 y.o. male presenting with shortness of breath. The history is provided by the patient. No language interpreter was used.  Shortness of Breath  The current episode started yesterday. The problem occurs occasionally. The problem has been gradually worsening. The problem is severe. Nothing relieves the symptoms. Nothing aggravates the symptoms. Associated symptoms include shortness of breath and wheezing.  Pt reports he has a history of asthma.  Pt complains of increasing difficulty breathing.  Pt reports his inhaler is empty.  Pt does not have a regular MD.  Pt reports he buys inhalers off the street from people  Past Medical History  Diagnosis Date  . Asthma   . Bronchitis     Past Surgical History  Procedure Date  . Tonsillectomy     No family history on file.  History  Substance Use Topics  . Smoking status: Current Every Day Smoker    Types: Cigarettes  . Smokeless tobacco: Not on file  . Alcohol Use: Yes     Occasional      Review of Systems  Respiratory: Positive for shortness of breath and wheezing.   All other systems reviewed and are negative.    Allergies  Review of patient's allergies indicates no known allergies.  Home Medications   Current Outpatient Rx  Name Route Sig Dispense Refill  . ALBUTEROL SULFATE HFA 108 (90 BASE) MCG/ACT IN AERS Inhalation Inhale 2 puffs into the lungs every 6 (six) hours as needed. For shortness of breath       BP 128/68  Pulse 111  Temp 98.6 F (37 C) (Oral)  Resp 20  SpO2 100%  Physical Exam  Nursing note and vitals reviewed. Constitutional: He is oriented to person, place, and time. He appears well-nourished.  HENT:  Head:  Normocephalic and atraumatic.  Right Ear: External ear normal.  Left Ear: External ear normal.  Nose: Nose normal.  Mouth/Throat: Oropharynx is clear and moist.  Eyes: Conjunctivae normal and EOM are normal. Pupils are equal, round, and reactive to light.  Neck: Normal range of motion. Neck supple.  Cardiovascular: Normal rate.        tachycardia  Pulmonary/Chest: He has wheezes.  Abdominal: Soft. Bowel sounds are normal.  Musculoskeletal: Normal range of motion.  Neurological: He is alert and oriented to person, place, and time. He has normal reflexes.  Skin: Skin is warm.  Psychiatric: He has a normal mood and affect.    ED Course  Procedures (including critical care time)  Labs Reviewed - No data to display Dg Chest 2 View  09/15/2012  *RADIOLOGY REPORT*  Clinical Data: As nine productive cough.  Mid chest pain.  CHEST - 2 VIEW  Comparison: Two-view chest 10/16/2011.  Findings: Mild perihilar bronchitic changes are similar to the prior exam.  The heart size is normal.  No focal airspace consolidation is evident.  The visualized soft tissues and bony thorax are unremarkable.  IMPRESSION:  1.  Mild perihilar bronchitic changes are similar to the prior exam.  These may be chronic and related to asthma.  An element of acute bronchitis is not excluded. 2.  No focal airspace consolidation to suggest bronchopneumonia.   Original Report Authenticated By: Jamesetta Orleans. MATTERN, M.D.  No diagnosis found.  Date: 09/15/2012  Rate: 119  Rhythm: sinus tachycardia  QRS Axis: normal  Intervals: normal  ST/T Wave abnormalities: normal  Conduction Disutrbances:none  Narrative Interpretation:   Old EKG Reviewed: none available  Pt had albuterol neb given by ems.   Pt reports minimal relief.   Pt placed on 1 hour continous neb.   Pt reports he feels better.  Pt has continued wheezing on exam.    Pt to go to [facility] for asthma protocol.  MDM          Elson Areas, PA 09/15/12  [phone]

## 2012-09-15 NOTE — ED Notes (Signed)
Patient ready to transport to x-ray via stretcher and he became nauseated and vomited a small amount of clear emesis. PA notified and Zofran 4 mgs given as ordered. Patient then transported via stretcher to x-ray.

## 2012-09-15 NOTE — ED Provider Notes (Signed)
2:30 PM Patient with a hx sig for asthma was placed in CDU on asthma protocol by Langston Masker, PA-C. Patient care resumed from Langston Masker, [place] .  Patient is here for nebulizer treatment and has received nebulizer treatment. Patient re-evaluated and is resting comfortable, VSS, with no new complaints or concerns at this time. Plan per previous provider is to treat with a few nebulizer treatments and monitor for improvement. On exam: wheezes heard throughout lung field bilaterally, hemodynamically stable, NAD, heart w/ RRR, Chest & abd non-tender, no peripheral edema or calf tenderness.   Patient will have a continuous nebulizer and bolus of fluids.  4:44 PM Patient not feeling better and O2 saturation has not improved. Patient's O2 saturation 91% on 2.0L after continuous nebulizer which is not improved from previous vitals. I will consult for admission.   5:21 PM Patient admitted to [facility] for asthma exacerbation. Dr. Malachi Bonds will see the patient.   Emilia Beck, PA-C 09/15/12 1816

## 2012-09-15 NOTE — ED Notes (Signed)
KIM WITH RESP THERAPY AWARE PT ON WHEEZE PROTOCOL

## 2012-09-15 NOTE — Progress Notes (Signed)
**Note De-identified Steven [name] Obfuscation** PEFR 33% AND FEV1 32% 

## 2012-09-15 NOTE — ED Provider Notes (Signed)
Medical screening examination/treatment/procedure(s) were performed by non-physician practitioner and as supervising physician I was immediately available for consultation/collaboration.  Cheri Guppy, MD 09/15/12 1242

## 2012-09-15 NOTE — ED Notes (Signed)
Continous Neb treatment at this time

## 2012-09-15 NOTE — Progress Notes (Signed)
**Note De-Identified Dugan Vanhoesen Obfuscation** PEFR 33% AND FEV1 32%

## 2012-09-15 NOTE — ED Notes (Signed)
MEAL ORDERED 

## 2012-09-16 DIAGNOSIS — J209 Acute bronchitis, unspecified: Secondary | ICD-10-CM | POA: Diagnosis present

## 2012-09-16 DIAGNOSIS — J309 Allergic rhinitis, unspecified: Secondary | ICD-10-CM

## 2012-09-16 DIAGNOSIS — J45909 Unspecified asthma, uncomplicated: Secondary | ICD-10-CM

## 2012-09-16 DIAGNOSIS — J9600 Acute respiratory failure, unspecified whether with hypoxia or hypercapnia: Secondary | ICD-10-CM

## 2012-09-16 LAB — CBC
Hemoglobin: 15.2 g/dL (ref 13.0–17.0)
MCH: 29.3 pg (ref 26.0–34.0)
MCHC: 35.2 g/dL (ref 30.0–36.0)
RDW: 12.8 % (ref 11.5–15.5)

## 2012-09-16 LAB — BASIC METABOLIC PANEL
BUN: 15 mg/dL (ref 6–23)
Creatinine, Ser: 0.9 mg/dL (ref 0.50–1.35)
GFR calc Af Amer: >90 mL/min (ref >90)
GFR calc non Af Amer: >90 mL/min (ref >90)
Glucose, Bld: 125 mg/dL — ABNORMAL HIGH (ref 70–99)
Potassium: 4.4 mEq/L (ref 3.5–5.1)

## 2012-09-16 LAB — RAPID URINE DRUG SCREEN, HOSP PERFORMED: Opiates: POSITIVE — AB

## 2012-09-16 MED ORDER — MOXIFLOXACIN HCL IN NACL 400 MG/250ML IV SOLN
400.0000 mg | INTRAVENOUS | Status: DC
  Administered 2012-09-16 – 2012-09-17 (×2): 400 mg via INTRAVENOUS
  Filled 2012-09-16 (×2): qty 250

## 2012-09-16 MED ORDER — ALBUTEROL SULFATE (5 MG/ML) 0.5% IN NEBU
2.5000 mg | INHALATION_SOLUTION | Freq: Four times a day (QID) | RESPIRATORY_TRACT | Status: DC
  Administered 2012-09-17 (×2): 2.5 mg via RESPIRATORY_TRACT
  Filled 2012-09-16 (×2): qty 0.5

## 2012-09-16 MED ORDER — METHYLPREDNISOLONE SODIUM SUCC 125 MG IJ SOLR
60.0000 mg | Freq: Four times a day (QID) | INTRAMUSCULAR | Status: DC
  Administered 2012-09-16 – 2012-09-17 (×4): 60 mg via INTRAVENOUS
  Filled 2012-09-16 (×8): qty 0.96

## 2012-09-16 MED ORDER — GUAIFENESIN ER 600 MG PO TB12
600.0000 mg | ORAL_TABLET | Freq: Two times a day (BID) | ORAL | Status: DC
  Administered 2012-09-16 – 2012-09-17 (×3): 600 mg via ORAL
  Filled 2012-09-16 (×4): qty 1

## 2012-09-16 MED ORDER — ALBUTEROL SULFATE (5 MG/ML) 0.5% IN NEBU: 2.5000 mg | INHALATION_SOLUTION | RESPIRATORY_TRACT | Status: DC | PRN

## 2012-09-16 NOTE — Care Management Note (Signed)
  Page 1 of 1   09/16/2012     7:01:33 PM   CARE MANAGEMENT NOTE 09/16/2012  Patient:  [facility]   Account Number:  [id number]  Date Initiated:  09/16/2012  Documentation initiated by:  [facility]  Subjective/Objective Assessment:   asthma     Action/Plan:   Anticipated DC Date:     Anticipated DC Plan:  HOME/SELF CARE      DC Planning Services  CM consult  Medication Assistance      Choice offered to / List presented to:             Status of service:  In process, will continue to follow Medicare Important Message given?   (If response is "NO", the following Medicare IM given date fields will be blank) Date Medicare IM given:   Date Additional Medicare IM given:    Discharge Disposition:    Per UR Regulation:    If discussed at Long Length of Stay Meetings, dates discussed:    Comments:  09/16/2012 1845 NCM contacted main pharmacy and pt is eligible for [facility] med assistance fund. Will provided full dose of abx and prednisone taper at d/c. And 3 day supply of other Rx for home. Will need separate Rx to take to main pharmacy. Pt will take inhalers used IP home. NCM will continue to follow and assist with any other d/c needs.  Isidoro Donning RN CCM Case Mgmt phone [phone]

## 2012-09-16 NOTE — ED Provider Notes (Signed)
Medical screening examination/treatment/procedure(s) were performed by non-physician practitioner and as supervising physician I was immediately available for consultation/collaboration.  Cheri Guppy, MD 09/16/12 [phone]

## 2012-09-16 NOTE — Progress Notes (Signed)
[facility] PROGRESS NOTE  Steven [name] JWJ:[id number] DOB: [birth year] DOA: 09/15/2012 PCP: Sheila Oats, MD  Assessment/Plan: Active Problems:  Severe persistent asthma  Allergic rhinitis  Status asthmaticus  Acute respiratory failure with hypoxia  Sinus tachycardia  Marijuana abuse  Hx of tobacco use, presenting hazards to health    1. Acute severe asthma: Patient presented with 3 days of cough, and worsening SOB/Wheeze, consistent with acute asthma attack. Managing with Oxygen supplementation, bronchodilator nebulizers, parenteral steroids and mucolytics. This AM, he feels marginally better, but is still wheezy. xygen via nasal canula. Continue current treatment.  2. Acute bronchitis: This is the likely precipitant for # 1 above. CXR revealed mild perihilar bronchitic changes, but no focal airspace consolidation. Today, patient clearly has yellow phlegm. We have added Avelox to treatment.  3. Acute hypoxic respiratory failure: This was evident on presentation, and was secondary to  #S 1&2 above. Responding to specific treatment. Today, patient is saturating at 94%-97% on 2L [place]. Further improvement is anticipated.  4. Allergic rhinitis: Known history of allergic rhinitis. managing with Flonase and Singulair.  5. Substance abuse: Will check UDS.  6. Tobacco abuse: Patient claims to have quit smoking about 9 months ago. Encouraged to abstain, and counseled.    Code Status: Full Code. Family Communication:  Disposition Plan: To be determined.    Brief narrative: 21 yo male with hx of bronchial asthma since childhood, allergic rhinitis, marijuana use, ex-smoker who presents with 2-3 days of sneezing and coughing, with production of clear phlegm. He started feeling short of breath 09/14/12 night, but did not want to the come to the emergency department. On 09/15/12, he was walking down the street and became so short of breath that he had to stop and sit on the curb and catch his  breath. He has not had an albuterol inhaler in many months and cannot remember the last time he was on controller medications. He had his friend call 911. He denies fevers or sick contacts. He was admitted for further management.    Consultants:  N/A  Procedures:  CXR  Antibiotics:  Avelox 09/16/12>>>  HPI/Subjective: Feels better this AM, coughing up yellow phlegm.   Objective: Vital signs in last 24 hours: Temp:  [97.3 F (36.3 C)-98.8 F (37.1 C)] 97.4 F (36.3 C) (10/27 0808) Pulse Rate:  [101-131] 101  (10/27 0459) Resp:  [15-28] 18  (10/27 0459) BP: (102-149)/(50-99) 119/63 mmHg (10/27 0322) SpO2:  [90 %-100 %] 96 % (10/27 0459) FiO2 (%):  [4 %] 4 % (10/26 1048) Weight:  [68.7 kg (151 lb 7.3 oz)] 68.7 kg (151 lb 7.3 oz) (10/26 1949) Weight change:     Intake/Output from previous day: 10/26 0701 - 10/27 0700 In: 1146.7 [P.O.:240; I.V.:906.7] Out: 825 [Urine:825]     Physical Exam: General: Comfortable, alert, communicative, fully oriented, not short of breath at rest, coughing intermittently, talking in complete sentences.  HEENT:  No clinical pallor, no jaundice, no conjunctival injection or discharge. Hydration is satisfactory.  NECK:  Supple, JVP not seen, no carotid bruits, no palpable lymphadenopathy, no palpable goiter. CHEST:  Bilateral polyphonic expiratory wheeze, no crackles. HEART:  Sounds 1 and 2 heard, normal, regular, mildly tachycardic, no murmurs. ABDOMEN:  Flat, soft, non-tender, no palpable organomegaly, no palpable masses, normal bowel sounds. GENITALIA:  Not examined. LOWER EXTREMITIES:  No pitting edema, palpable peripheral pulses. MUSCULOSKELETAL SYSTEM:  unremarkable. CENTRAL NERVOUS SYSTEM:  No focal neurologic deficit on gross examination.  Lab Results:  [facility] 09/16/12 0450 09/15/12  2008  WBC 5.5 7.1  HGB 15.2 15.7  HCT 43.2 44.8  PLT 204 191    Basename 09/16/12 0450 09/15/12 2008  NA 136 137  K 4.4 3.4*  CL 101 99    CO2 24 19  GLUCOSE 125* 141*  BUN 15 13  CREATININE 0.90 0.98  CALCIUM 9.3 9.8   Recent Results (from the past 240 hour(s))  MRSA PCR SCREENING     Status: Normal   Collection Time   09/15/12  8:24 PM      Component Value Range Status Comment   MRSA by PCR NEGATIVE  NEGATIVE Final      Studies/Results: Dg Chest 2 View  09/15/2012  *RADIOLOGY REPORT*  Clinical Data: As nine productive cough.  Mid chest pain.  CHEST - 2 VIEW  Comparison: Two-view chest 10/16/2011.  Findings: Mild perihilar bronchitic changes are similar to the prior exam.  The heart size is normal.  No focal airspace consolidation is evident.  The visualized soft tissues and bony thorax are unremarkable.  IMPRESSION:  1.  Mild perihilar bronchitic changes are similar to the prior exam.  These may be chronic and related to asthma.  An element of acute bronchitis is not excluded. 2.  No focal airspace consolidation to suggest bronchopneumonia.   Original Report Authenticated By: Jamesetta Orleans. MATTERN, M.D.     Medications: Scheduled Meds:   . acetaminophen  650 mg Oral Once  . albuterol  2.5 mg Nebulization Q4H  . albuterol  5 mg Nebulization Once  . albuterol      . albuterol  10 mg/hr Nebulization Once  . budesonide  0.5 mg Nebulization BID  . docusate sodium  100 mg Oral BID  . enoxaparin (LOVENOX) injection  40 mg Subcutaneous Q24H  . fluticasone  2 spray Each Nare Daily  . influenza  inactive virus vaccine  0.5 mL Intramuscular Tomorrow-1000  . ipratropium  0.5 mg Nebulization Q4H  . magnesium sulfate 1 - 4 g bolus IVPB  2 g Intravenous Once  . methylPREDNISolone (SOLU-MEDROL) injection  125 mg Intravenous Once  . methylPREDNISolone (SOLU-MEDROL) injection  60 mg Intravenous Q12H  . montelukast  10 mg Oral QHS  . ondansetron  4 mg Intravenous Once  . pantoprazole  40 mg Oral Daily  . pneumococcal 23 valent vaccine  0.5 mL Intramuscular Tomorrow-1000  . predniSONE  60 mg Oral Once  . sodium chloride   1,000 mL Intravenous Once  . sodium chloride  3 mL Intravenous Q12H  . DISCONTD: LORazepam  1 mg Intravenous Once  . DISCONTD:  morphine injection  4 mg Intravenous Once   Continuous Infusions:   . sodium chloride 100 mL/hr at 09/16/12 0339  . DISCONTD: albuterol Stopped (09/15/12 1700)  . DISCONTD: albuterol     PRN Meds:.acetaminophen, acetaminophen, HYDROcodone-acetaminophen, ondansetron (ZOFRAN) IV, ondansetron, zolpidem, DISCONTD: ondansetron (ZOFRAN) IV    LOS: 1 day   Finesse Fielder,CHRISTOPHER  [facility] Pager [phone]. If 8PM-8AM, please contact night-coverage at [email or web address], password [facility] 09/16/2012, 8:32 AM  LOS: 1 day

## 2012-09-17 LAB — COMPREHENSIVE METABOLIC PANEL
ALT: 23 U/L (ref 0–53)
BUN: 18 mg/dL (ref 6–23)
CO2: 24 mEq/L (ref 19–32)
Calcium: 9.3 mg/dL (ref 8.4–10.5)
Creatinine, Ser: 0.86 mg/dL (ref 0.50–1.35)
GFR calc Af Amer: >90 mL/min (ref >90)
GFR calc non Af Amer: >90 mL/min (ref >90)
Glucose, Bld: 120 mg/dL — ABNORMAL HIGH (ref 70–99)

## 2012-09-17 LAB — CBC
HCT: 44.2 % (ref 39.0–52.0)
Hemoglobin: 15.2 g/dL (ref 13.0–17.0)
MCH: 29 pg (ref 26.0–34.0)
MCV: 84.2 fL (ref 78.0–100.0)
RBC: 5.25 MIL/uL (ref 4.22–5.81)

## 2012-09-17 MED ORDER — ALBUTEROL SULFATE HFA 108 (90 BASE) MCG/ACT IN AERS: 2.0000 | INHALATION_SPRAY | RESPIRATORY_TRACT | Status: DC | PRN

## 2012-09-17 MED ORDER — FLUTICASONE PROPIONATE 50 MCG/ACT NA SUSP: 1.0000 | Freq: Every day | NASAL | Status: DC | PRN

## 2012-09-17 MED ORDER — MONTELUKAST SODIUM 10 MG PO TABS: 10.0000 mg | ORAL_TABLET | Freq: Every day | ORAL | Status: DC

## 2012-09-17 MED ORDER — ALBUTEROL SULFATE HFA 108 (90 BASE) MCG/ACT IN AERS
2.0000 | INHALATION_SPRAY | RESPIRATORY_TRACT | Status: DC | PRN
  Filled 2012-09-17 (×2): qty 6.7

## 2012-09-17 MED ORDER — PREDNISONE 10 MG PO TABS: ORAL_TABLET | ORAL | Status: DC

## 2012-09-17 MED ORDER — GUAIFENESIN ER 600 MG PO TB12: 600.0000 mg | ORAL_TABLET | Freq: Two times a day (BID) | ORAL | Status: DC

## 2012-09-17 MED ORDER — MOXIFLOXACIN HCL 400 MG PO TABS: 400.0000 mg | ORAL_TABLET | Freq: Every day | ORAL | Status: DC

## 2012-09-17 MED ORDER — PREDNISONE 20 MG PO TABS
40.0000 mg | ORAL_TABLET | Freq: Every day | ORAL | Status: DC
  Filled 2012-09-17 (×2): qty 2

## 2012-09-17 NOTE — Discharge Summary (Signed)
Physician Discharge Summary  Steven [name] AVW:[id number] DOB: Mar 26, 1991 DOA: 09/15/2012  PCP: Sheila Oats, MD  Admit date: 09/15/2012 Discharge date: 09/17/2012  Time spent: 35 minutes  Recommendations for Outpatient Follow-up:  1. Follow up with primary MD.   Discharge Diagnoses:  Active Problems:  Severe persistent asthma  Allergic rhinitis  Status asthmaticus  Acute respiratory failure with hypoxia  Sinus tachycardia  Marijuana abuse  Hx of tobacco use, presenting hazards to health  Acute bronchitis   Discharge Condition: Satisfactory.   Diet recommendation: Regular.   Filed Weights   09/15/12 1949  Weight: 68.7 kg (151 lb 7.3 oz)    History of present illness:  21 yo male with hx of bronchial asthma since childhood, allergic rhinitis, marijuana use, ex-smoker who presents with 2-3 days of sneezing and coughing, with production of clear phlegm. He started feeling short of breath 09/14/12 night, but did not want to the come to the emergency department. On 09/15/12, he was walking down the street and became so short of breath that he had to stop and sit on the curb and catch his breath. He has not had an albuterol inhaler in many months and cannot remember the last time he was on controller medications. He had his friend call 911. He denies fevers or sick contacts. He was admitted for further management.    Hospital Course:  1. Acute severe asthma: Patient presented with 3 days of cough, and worsening SOB/Wheeze, consistent with acute asthma attack. He was managed with Oxygen supplementation, bronchodilator nebulizers, parenteral steroids and mucolytics, with dramatic clinical response. As of 09/17/12, he was feeling considerably better, and keen to be discharged. He had been transitioned to oral steroid taper. 2. Acute bronchitis: This is the likely precipitant for # 1 above. CXR revealed mild perihilar bronchitic changes, but no focal airspace consolidation. Patient  also, had yellowish phlegm. He was treated with a 7-day course of Avelox, to be concluded on 09/22/12.  3. Acute hypoxic respiratory failure: This was evident on presentation, and was secondary to #s 1&2 above. Patient responded to specific treatment, and as of 09/17/12, was saturating at 94%-97% on room air.  4. Allergic rhinitis: Known history of allergic rhinitis. Managed with Flonase and Singulair.  5. Substance abuse: UDS was positive for tetrahydrocannabinol. .  6. Tobacco abuse: Patient claims to have quit smoking about 9 months ago. Encouraged to continue abstaining, and counseled.    Procedures:  See below.  Consultations:  N/A.   Discharge Exam: Filed Vitals:   09/17/12 0358 09/17/12 0800 09/17/12 0834 09/17/12 1140  BP: 93/41 136/69    Pulse: 82 92    Temp: 98.1 F (36.7 C) 98.6 F (37 C)  97.2 F (36.2 C)  TempSrc: Oral Oral  Oral  Resp: 25 23    Height:      Weight:      SpO2: 96% 91% 97%     General: Comfortable, alert, communicative, fully oriented, not short of breath at rest, coughing intermittently, talking in complete sentences.  HEENT: No clinical pallor, no jaundice, no conjunctival injection or discharge. Hydration is satisfactory.  NECK: Supple, JVP not seen, no carotid bruits, no palpable lymphadenopathy, no palpable goiter.  CHEST: Bilateral polyphonic expiratory wheeze, no crackles.  HEART: Sounds 1 and 2 heard, normal, regular, mildly tachycardic, no murmurs.  ABDOMEN: Flat, soft, non-tender, no palpable organomegaly, no palpable masses, normal bowel sounds.  GENITALIA: Not examined.  LOWER EXTREMITIES: No pitting edema, palpable peripheral pulses.  MUSCULOSKELETAL SYSTEM: unremarkable.  CENTRAL NERVOUS SYSTEM: No focal neurologic deficit on gross examination.  Discharge Instructions      Discharge Orders    Future Orders Please Complete By Expires   Diet general      Increase activity slowly          Medication List     As of  09/17/2012 11:48 AM    TAKE these medications         albuterol 108 (90 BASE) MCG/ACT inhaler   Commonly known as: PROVENTIL HFA;VENTOLIN HFA   Inhale 2 puffs into the lungs every 4 (four) hours as needed for wheezing or shortness of breath.      fluticasone 50 MCG/ACT nasal spray   Commonly known as: FLONASE   Place 1 spray into the nose daily as needed for allergies.      guaiFENesin 600 MG 12 hr tablet   Commonly known as: MUCINEX   Take 1 tablet (600 mg total) by mouth 2 (two) times daily.      montelukast 10 MG tablet   Commonly known as: SINGULAIR   Take 1 tablet (10 mg total) by mouth at bedtime.      moxifloxacin 400 MG tablet   Commonly known as: AVELOX   Take 1 tablet (400 mg total) by mouth daily.      predniSONE 10 MG tablet   Commonly known as: DELTASONE   Take 40 mg daily for 3 days, then 30 mg daily for 3 days, then 20 mg daily for 3 days, then 10 mg daily for 3 days, then stop.         Follow-up Information    Follow up with DEFAULT,PROVIDER, MD. (Follow up with primary MD. )    Contact information:   [address] [place] [address] [phone]           The results of significant diagnostics from this hospitalization (including imaging, microbiology, ancillary and laboratory) are listed below for reference.    Significant Diagnostic Studies: Dg Chest 2 View  09/15/2012  *RADIOLOGY REPORT*  Clinical Data: As nine productive cough.  Mid chest pain.  CHEST - 2 VIEW  Comparison: Two-view chest 10/16/2011.  Findings: Mild perihilar bronchitic changes are similar to the prior exam.  The heart size is normal.  No focal airspace consolidation is evident.  The visualized soft tissues and bony thorax are unremarkable.  IMPRESSION:  1.  Mild perihilar bronchitic changes are similar to the prior exam.  These may be chronic and related to asthma.  An element of acute bronchitis is not excluded. 2.  No focal airspace consolidation to suggest bronchopneumonia.    Original Report Authenticated By: Jamesetta Orleans. MATTERN, M.D.     Microbiology: Recent Results (from the past 240 hour(s))  MRSA PCR SCREENING     Status: Normal   Collection Time   09/15/12  8:24 PM      Component Value Range Status Comment   MRSA by PCR NEGATIVE  NEGATIVE Final      Labs: Basic Metabolic Panel:  Lab 09/17/12 9147 09/16/12 0450 09/15/12 2008  NA 134* 136 137  K 4.3 4.4 3.4*  CL 99 101 99  CO2 24 24 19   GLUCOSE 120* 125* 141*  BUN 18 15 13   CREATININE 0.86 0.90 0.98  CALCIUM 9.3 9.3 9.8  MG -- -- --  PHOS -- -- --   Liver Function Tests:  Lab 09/17/12 0515  AST 27  ALT 23  ALKPHOS 50  BILITOT  1.4*  PROT 7.0  ALBUMIN 3.6   No results found for this basename: LIPASE:5,AMYLASE:5 in the last 168 hours No results found for this basename: AMMONIA:5 in the last 168 hours CBC:  Lab 09/17/12 0515 09/16/12 0450 09/15/12 2008  WBC 8.2 5.5 7.1  NEUTROABS -- -- 6.9  HGB 15.2 15.2 15.7  HCT 44.2 43.2 44.8  MCV 84.2 83.2 83.9  PLT 225 204 191   Cardiac Enzymes: No results found for this basename: CKTOTAL:5,CKMB:5,CKMBINDEX:5,TROPONINI:5 in the last 168 hours BNP: BNP (last 3 results) No results found for this basename: PROBNP:3 in the last 8760 hours CBG: No results found for this basename: GLUCAP:5 in the last 168 hours     Signed:  Tabatha Razzano,CHRISTOPHER  [facility] 09/17/2012, 11:48 AM

## 2012-09-17 NOTE — Progress Notes (Signed)
Patient d/c'd home per MD order. Patient given prescriptions/filled medications/instructions and all questions answered. Patient d/c'd via wheelchair with RN.  VVS and assessment WNL.

## 2012-09-17 NOTE — Progress Notes (Addendum)
Case management still following , will need second set of prescriptions at time of d/c to assist with meds at the hospital. Also noted that pt is on Albuterol, and this medication may be purchased at [facility] for $4.  Johny Shock RN MPH [phone]  09/17/2012 1230, pt provided with list of PCP providers for uninsured pts, and prescriptions sent to pharmacy for assistance.

## 2012-09-17 NOTE — Clinical Social Work Psychosocial (Signed)
     Clinical Social Work Department BRIEF PSYCHOSOCIAL ASSESSMENT 09/17/2012  Patient:  [facility]     Account Number:  [id number]     Admit date:  09/15/2012  Clinical Social Worker:  Peggyann Shoals  Date/Time:  09/17/2012 11:57 AM  Referred by:  Physician  Date Referred:  09/15/2012 Referred for  Other - See comment   Other Referral:   Insurance Needs   Interview type:  Patient Other interview type:    PSYCHOSOCIAL DATA Living Status:  FAMILY Admitted from facility:   Level of care:   Primary support name:  Etheleen Nicks Primary support relationship to patient:  FAMILY Degree of support available:   adequate    CURRENT CONCERNS Current Concerns  Other - See comment   Other Concerns:   Insurance needs.    SOCIAL WORK ASSESSMENT / PLAN CSW met with pt to address consult. CSW introduced herself and explained role of social work. Pt shared that he was intereseted in health insurance as well as disability. Pt reported that he is unemployed and needs to see if medically he could work.    RNCM is following for medication assistance and linking pt with PCP.    CSW provided resources to assist pt at discharge. CSW provided support. CSW is signing off as further needs identified.   Assessment/plan status:  No Further Intervention Required Other assessment/ plan:   Information/referral to community resources:   Healthcare.gov for insurance needs.  [employer] and [place] DSS  [employer]    PATIENTS/FAMILYS RESPONSE TO PLAN OF CARE: Pt was alert and oriented. Pt was very pleasant and accepting of resources.

## 2012-09-17 NOTE — Progress Notes (Signed)
   CARE MANAGEMENT NOTE 09/17/2012  Patient:  [facility]   Account Number:  [id number]  Date Initiated:  09/16/2012  Documentation initiated by:  [facility]  Subjective/Objective Assessment:   asthma     Action/Plan:   Pt provided with list of clinic with phone numbers and addresses. Prescriptions sent to pharmacy for assistance with meds.   Anticipated DC Date:  09/17/2012   Anticipated DC Plan:  HOME/SELF CARE      DC Planning Services  CM consult  Medication Assistance      Choice offered to / List presented to:             Status of service:  Completed, signed off Medicare Important Message given?   (If response is "NO", the following Medicare IM given date fields will be blank) Date Medicare IM given:   Date Additional Medicare IM given:    Discharge Disposition:  HOME/SELF CARE  Per UR Regulation:    If discussed at Long Length of Stay Meetings, dates discussed:    Comments:  09/16/2012 1845 NCM contacted main pharmacy and pt is eligible for [employer] med assistance fund. Will provided full dose of abx and prednisone taper at d/c. And 3 day supply of other Rx for home. Will need separate Rx to take to main pharmacy. Pt will take inhalers used IP home.   Isidoro Donning RN CCM Case Mgmt phone [phone]

## 2012-09-18 NOTE — Progress Notes (Signed)
Utilization Review Completed.  

## 2013-01-24 ENCOUNTER — Emergency Department (HOSPITAL_COMMUNITY): Payer: Self-pay

## 2013-01-24 ENCOUNTER — Emergency Department (HOSPITAL_COMMUNITY)
Admission: EM | Admit: 2013-01-24 | Discharge: 2013-01-24 | Disposition: A | Discharge status: Home / Self Care | Payer: Self-pay | Attending: Emergency Medicine | Admitting: Emergency Medicine

## 2013-01-24 ENCOUNTER — Encounter (HOSPITAL_COMMUNITY): Payer: Self-pay | Admitting: *Deleted

## 2013-01-24 DIAGNOSIS — R229 Localized swelling, mass and lump, unspecified: Secondary | ICD-10-CM | POA: Insufficient documentation

## 2013-01-24 DIAGNOSIS — R599 Enlarged lymph nodes, unspecified: Secondary | ICD-10-CM | POA: Insufficient documentation

## 2013-01-24 DIAGNOSIS — Z9889 Other specified postprocedural states: Secondary | ICD-10-CM | POA: Insufficient documentation

## 2013-01-24 DIAGNOSIS — J3489 Other specified disorders of nose and nasal sinuses: Secondary | ICD-10-CM | POA: Insufficient documentation

## 2013-01-24 DIAGNOSIS — Z8669 Personal history of other diseases of the nervous system and sense organs: Secondary | ICD-10-CM | POA: Insufficient documentation

## 2013-01-24 DIAGNOSIS — J029 Acute pharyngitis, unspecified: Secondary | ICD-10-CM | POA: Insufficient documentation

## 2013-01-24 DIAGNOSIS — Z87891 Personal history of nicotine dependence: Secondary | ICD-10-CM | POA: Insufficient documentation

## 2013-01-24 DIAGNOSIS — Z79899 Other long term (current) drug therapy: Secondary | ICD-10-CM | POA: Insufficient documentation

## 2013-01-24 DIAGNOSIS — J45909 Unspecified asthma, uncomplicated: Secondary | ICD-10-CM | POA: Insufficient documentation

## 2013-01-24 DIAGNOSIS — F1210 Cannabis abuse, uncomplicated: Secondary | ICD-10-CM | POA: Insufficient documentation

## 2013-01-24 MED ORDER — PENICILLIN G BENZATHINE 1200000 UNIT/2ML IM SUSP
1.2000 10*6.[IU] | Freq: Once | INTRAMUSCULAR | Status: AC
  Administered 2013-01-24: 1.2 10*6.[IU] via INTRAMUSCULAR
  Filled 2013-01-24: qty 2

## 2013-01-24 MED ORDER — HYDROCODONE-ACETAMINOPHEN 5-325 MG PO TABS
1.0000 | ORAL_TABLET | Freq: Four times a day (QID) | ORAL | Status: DC | PRN
Start: 2013-01-24 — End: 2016-12-02

## 2013-01-24 MED ORDER — OXYCODONE-ACETAMINOPHEN 5-325 MG PO TABS
1.0000 | ORAL_TABLET | Freq: Once | ORAL | Status: AC
  Administered 2013-01-24: 1 via ORAL
  Filled 2013-01-24: qty 1

## 2013-01-24 NOTE — ED Notes (Signed)
Pt presents by ems c/o sore throat since yesterday. Painful swallowing. Also c/o R wrist pain s/p fall a few days ago, no deformity noted per ems.

## 2013-01-24 NOTE — ED Provider Notes (Signed)
Medical screening examination/treatment/procedure(s) were performed by non-physician practitioner and as supervising physician I was immediately available for consultation/collaboration.    Celene Kras, MD 01/24/13 [phone]

## 2013-01-24 NOTE — ED Provider Notes (Signed)
History    This chart was scribed for non-physician practitioner working with Celene Kras, MD by Gerlean Ren, ED Scribe. This patient was seen in room WTR7/WTR7 and the patient's care was started at 4:14 PM.    CSN: [id number]  Arrival date & time 01/24/13  1534   First MD Initiated Contact with Patient 01/24/13 1545      Chief Complaint  Patient presents with  . Sore Throat     The history is provided by the patient. No language interpreter was used.  Steven [name] is a 22 y.o. male who presents to the Emergency Department complaining of a sore throat localized to the left side with an associated mass noted underneath left maxillary region.  Pt reports the mass was first noticed yesterday but began causing pain when swallowing today.  Pain is not constant, only present when swallowing.  Pt denies fevers, night sweats, chills, dyspnea, difficulty swallowing fluids, cough, abdominal pain.  No known sick contacts.  Pt has chronic nasal congestion that is unchanged recently.  Pt denies recently smoking any tobacco or marijuana in excess, but reports some occasional marijuana use.  Pt has had tonsillectomy.    Past Medical History  Diagnosis Date  . Severe persistent asthma   . Bronchitis   . Febrile seizure   . Allergic rhinitis   . Marijuana abuse   . Hx of tobacco use, presenting hazards to health     Past Surgical History  Procedure Laterality Date  . Tonsillectomy  2008    Family History  Problem Relation Age of Onset  . Asthma Mother   . Cancer Mother   . Asthma Brother     History  Substance Use Topics  . Smoking status: Former Smoker    Types: Cigarettes    Quit date: 11/22/2011  . Smokeless tobacco: Not on file  . Alcohol Use: 0.6 oz/week    1 Cans of beer per week     Comment: Occasional      Review of Systems  Constitutional: Negative for fever and chills.  HENT: Positive for congestion (nasal, chronic) and sore throat.        Painful swallowing   Respiratory: Negative for cough and shortness of breath.   Gastrointestinal: Negative for abdominal pain.  All other systems reviewed and are negative.    Allergies  Review of patient's allergies indicates no known allergies.  Home Medications   Current Outpatient Rx  Name  Route  Sig  Dispense  Refill  . albuterol (PROVENTIL HFA;VENTOLIN HFA) 108 (90 BASE) MCG/ACT inhaler   Inhalation   Inhale 2 puffs into the lungs every 4 (four) hours as needed for wheezing or shortness of breath.   1 Inhaler   2   . fluticasone (FLONASE) 50 MCG/ACT nasal spray   Nasal   Place 1 spray into the nose daily as needed for allergies.   16 g   0   . guaiFENesin (MUCINEX) 600 MG 12 hr tablet   Oral   Take 1 tablet (600 mg total) by mouth 2 (two) times daily.   14 tablet   0   . montelukast (SINGULAIR) 10 MG tablet   Oral   Take 1 tablet (10 mg total) by mouth at bedtime.   30 tablet   2   . moxifloxacin (AVELOX) 400 MG tablet   Oral   Take 1 tablet (400 mg total) by mouth daily.   5 tablet   0   . predniSONE (  DELTASONE) 10 MG tablet      Take 40 mg daily for 3 days, then 30 mg daily for 3 days, then 20 mg daily for 3 days, then 10 mg daily for 3 days, then stop.   30 tablet   0     BP 131/77  Pulse 71  Temp(Src) 97.9 F (36.6 C) (Oral)  Resp 14  SpO2 99%  Physical Exam  Nursing note and vitals reviewed. Constitutional: He is oriented to person, place, and time. He appears well-developed and well-nourished. No distress.  HENT:  Head: Normocephalic and atraumatic. No trismus in the jaw.  Right Ear: Tympanic membrane, external ear and ear canal normal.  Left Ear: Tympanic membrane, external ear and ear canal normal.  Nose: Nose normal. No rhinorrhea. Right sinus exhibits no maxillary sinus tenderness and no frontal sinus tenderness. Left sinus exhibits no maxillary sinus tenderness and no frontal sinus tenderness.  Mouth/Throat: Uvula is midline and mucous membranes are  normal. Normal dentition. No dental abscesses or edematous. Oropharyngeal exudate and posterior oropharyngeal edema present. No posterior oropharyngeal erythema or tonsillar abscesses.  No submental edema, tongue not elevated, no trismus. No impending airway obstruction; Pt able to speak full sentences, swallow intact, no drooling, stridor, or tonsillar/uvula displacement. No palatal petechia  Eyes: Conjunctivae are normal.  Neck: Trachea normal, normal range of motion and full passive range of motion without pain. Neck supple. No rigidity. Normal range of motion present. No Brudzinski's sign noted.  Flexion and extension of neck without pain or difficulty. Able to breath without difficulty in extension.  Cardiovascular: Normal rate and regular rhythm.   Pulmonary/Chest: Effort normal and breath sounds normal. No stridor. No respiratory distress. He has no wheezes.  Abdominal: Soft. There is no tenderness.  No obvious evidence of splenomegaly. Non ttp.   Musculoskeletal: Normal range of motion.  Lymphadenopathy:       Head (right side): No preauricular and no posterior auricular adenopathy present.       Head (left side): No preauricular and no posterior auricular adenopathy present.    He has cervical adenopathy.  Neurological: He is alert and oriented to person, place, and time.  Skin: Skin is warm and dry. No rash noted. He is not diaphoretic.  Psychiatric: He has a normal mood and affect.    ED Course  Procedures (including critical care time) DIAGNOSTIC STUDIES: Oxygen Saturation is 99% on room air, normal by my interpretation.    COORDINATION OF CARE: 4:19 PM- Patient informed of clinical course, understands medical decision-making process, and agrees with plan.    Dg Wrist Complete Right  01/24/2013  *RADIOLOGY REPORT*  Clinical Data: History of fall complaining of right sided wrist pain.  RIGHT WRIST - COMPLETE 3+ VIEW  Comparison: No priors.  Findings: Four views of the right  wrist demonstrate no acute displaced fracture, subluxation, dislocation, joint or soft tissue abnormality.  IMPRESSION: 1.  No acute radiographic abnormality of the right wrist.   Original Report Authenticated By: Trudie Reed, M.D.     No diagnosis found.  Note triage ordered xray of wrist bc pt mentioned it hurt. neurovascularly intact on PE with no bony ttp. Imaging reviewed & no acute abnormalities.   MDM  Sore throat  Pt afebrile, but with tonsillar exudate, mild cervical lymphadenopathy, & dysphagia; diagnosis of strep. Treated in the Ed with Pain medication and PCN IM.  Pt appears mildly dehydrated, discussed importance of water rehydration. Presentation non concerning for PTA or infxn spread to  soft tissue. No trismus or uvula deviation. Specific return precautions discussed. Pt able to drink water in ED without difficulty with intact air way. Recommended PCP follow up.   I personally performed the services described in this documentation, which was scribed in my presence. The recorded information has been reviewed and is accurate.           Jaci Carrel, [place] 01/24/13 1636

## 2013-01-30 IMAGING — CR DG CHEST 2V
2 series · 2 of 2 positions shown · non-contrast
Comparison: Chest radiograph performed 07/20/2008

CLINICAL DATA: Shortness of breath, fever, cough and congestion for
1 day; history of asthma.

CHEST - 2 VIEW

[w chest pa]
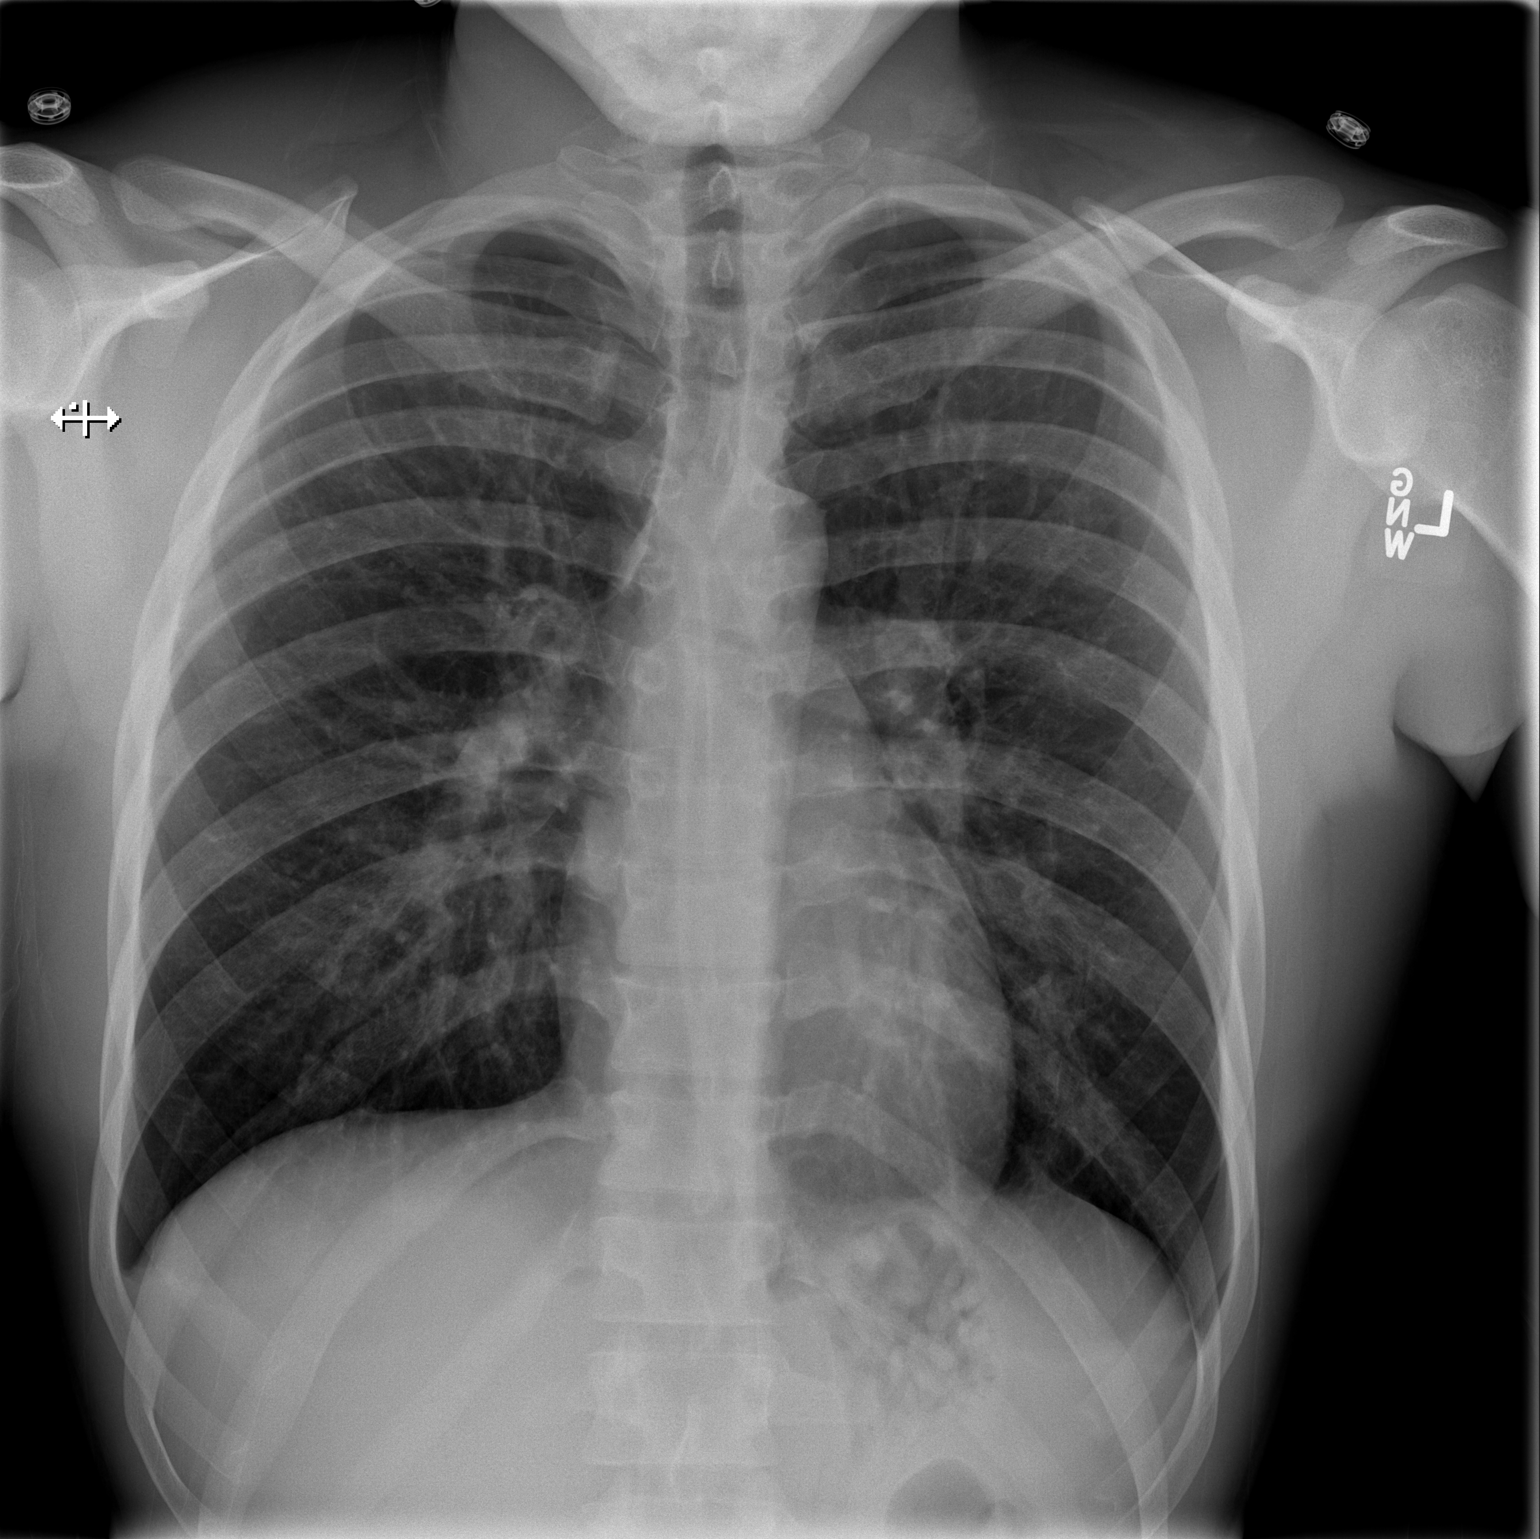

[w chest lat]
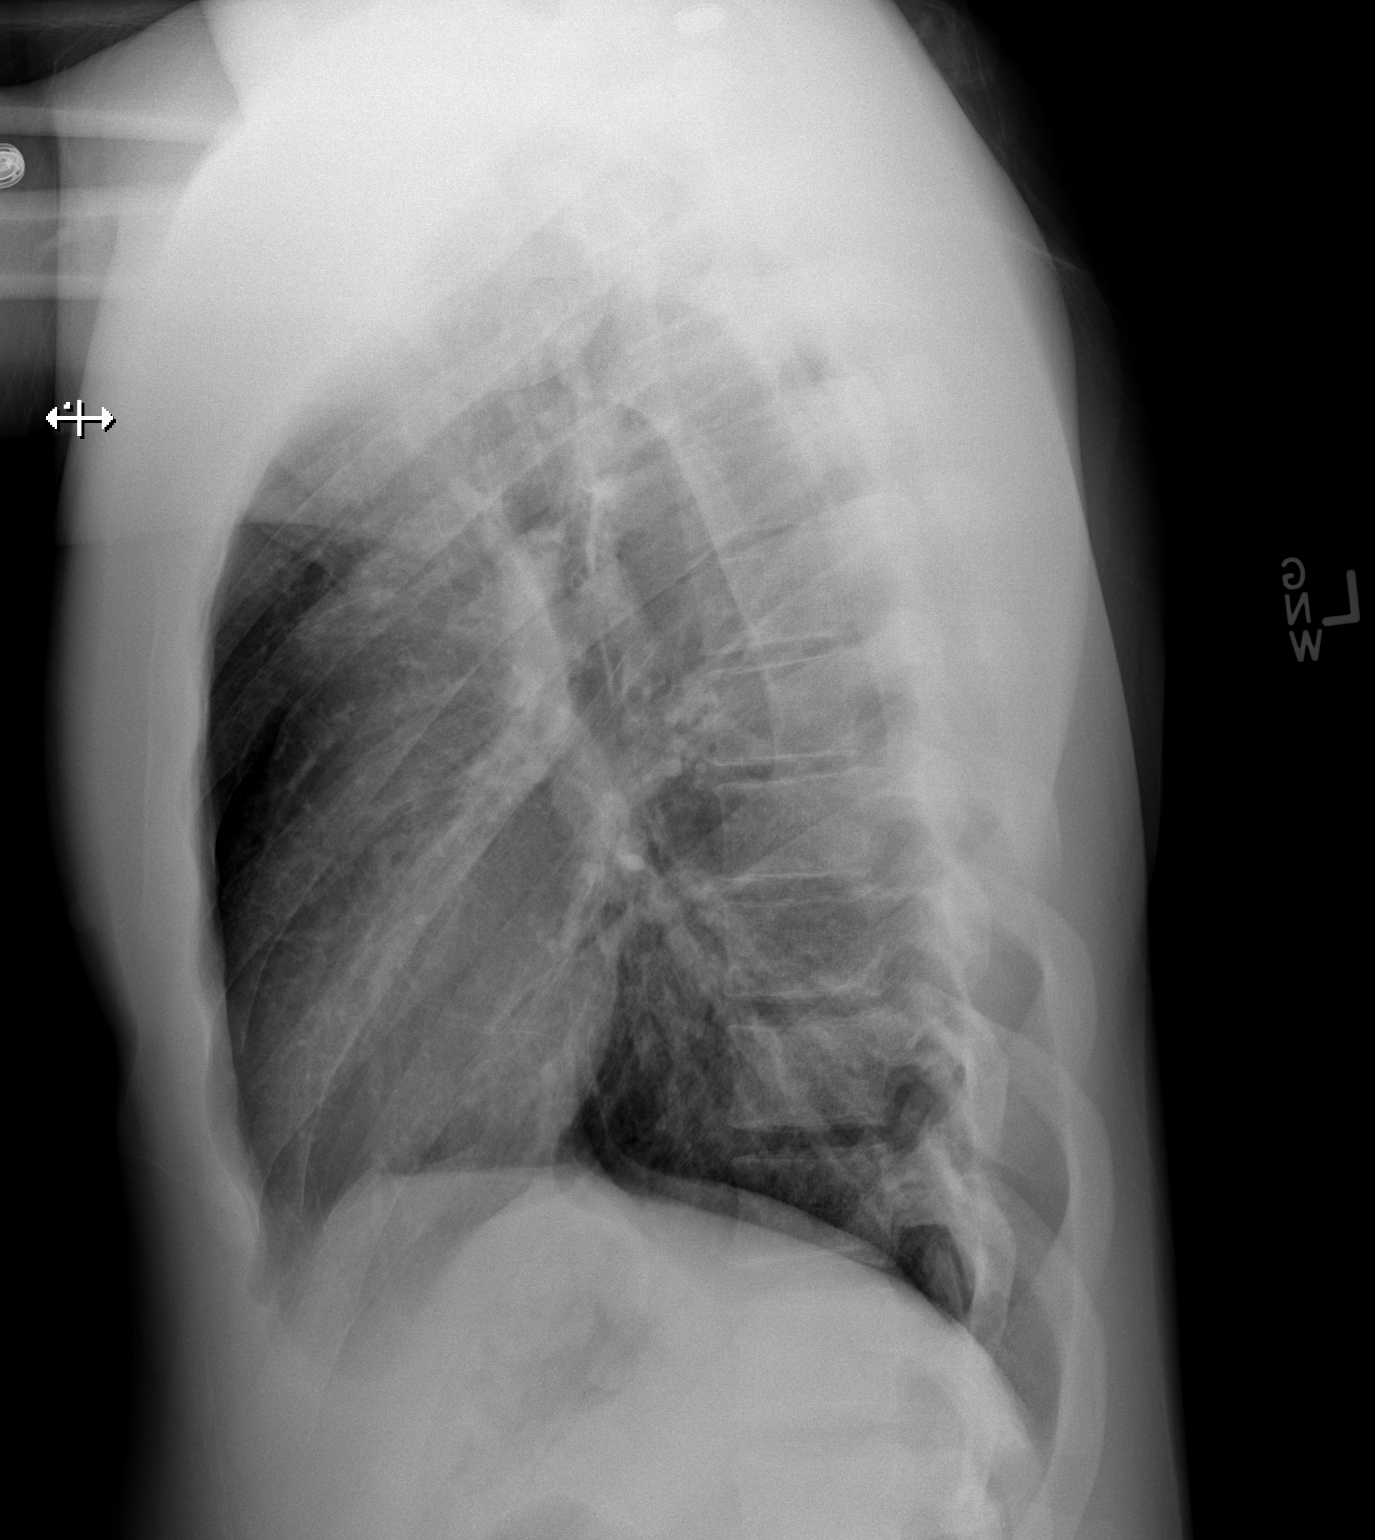

[2 of 2 positions shown; findings below may reference images not displayed]

FINDINGS: The lungs are well-aerated and clear.  There is no
evidence of focal opacification, pleural effusion or pneumothorax.

The heart is normal in size; the mediastinal contour is within
normal limits.  No acute osseous abnormalities are seen.
IMPRESSION: No acute cardiopulmonary process seen.

## 2015-07-29 ENCOUNTER — Emergency Department (HOSPITAL_COMMUNITY): Admission: EM | Admit: 2015-07-29 | Discharge: 2015-07-29 | Discharge status: Absent without leave | Payer: Self-pay

## 2015-07-29 ENCOUNTER — Emergency Department (HOSPITAL_COMMUNITY)
Admission: EM | Admit: 2015-07-29 | Discharge: 2015-07-29 | Discharge status: Against Medical Advice | Payer: Self-pay | Attending: Emergency Medicine | Admitting: Emergency Medicine

## 2015-07-29 ENCOUNTER — Encounter (HOSPITAL_COMMUNITY): Payer: Self-pay | Admitting: *Deleted

## 2015-07-29 DIAGNOSIS — R079 Chest pain, unspecified: Secondary | ICD-10-CM | POA: Insufficient documentation

## 2015-07-29 DIAGNOSIS — Z5321 Procedure and treatment not carried out due to patient leaving prior to being seen by health care provider: Secondary | ICD-10-CM

## 2015-07-29 DIAGNOSIS — J45909 Unspecified asthma, uncomplicated: Secondary | ICD-10-CM | POA: Insufficient documentation

## 2015-07-29 HISTORY — DX: Unspecified asthma, uncomplicated: J45.909

## 2015-07-29 LAB — BASIC METABOLIC PANEL
Anion gap: 8 (ref 5–15)
BUN: 11 mg/dL (ref 6–20)
CO2: 25 mmol/L (ref 22–32)
Calcium: 9.5 mg/dL (ref 8.9–10.3)
Chloride: 105 mmol/L (ref 101–111)
Creatinine, Ser: 0.92 mg/dL (ref 0.61–1.24)
Glucose, Bld: 120 mg/dL — ABNORMAL HIGH (ref 65–99)
Potassium: 3.7 mmol/L (ref 3.5–5.1)
Sodium: 138 mmol/L (ref 135–145)

## 2015-07-29 LAB — CBC
HEMATOCRIT: 44.1 % (ref 39.0–52.0)
HEMOGLOBIN: 14.8 g/dL (ref 13.0–17.0)
MCH: 28.6 pg (ref 26.0–34.0)
MCHC: 33.6 g/dL (ref 30.0–36.0)
MCV: 85.1 fL (ref 78.0–100.0)
Platelets: 198 10*3/uL (ref 150–400)
RBC: 5.18 MIL/uL (ref 4.22–5.81)
RDW: 13 % (ref 11.5–15.5)
WBC: 2.9 10*3/uL — AB (ref 4.0–10.5)

## 2015-07-29 LAB — I-STAT TROPONIN, ED: Troponin i, poc: 0 ng/mL (ref 0.00–0.08)

## 2015-07-29 NOTE — ED Notes (Signed)
Patien presents stating earlier today he started with CP.  Stated he was sweeping the floor and his chest started hurting  Constant throughout the day but has decreased in pain

## 2015-07-29 NOTE — ED Notes (Signed)
Pt's name called twice for xray no answer

## 2015-07-29 NOTE — ED Notes (Signed)
Called x1 to take to tx room, no answer

## 2015-07-29 NOTE — ED Provider Notes (Signed)
Steven [name] left before being back to a room for evaluation.  Marlon Pel, PA-C 07/29/15 2214  Marily Memos, MD 08/02/15 2106

## 2015-08-19 ENCOUNTER — Emergency Department (HOSPITAL_COMMUNITY)
Admission: EM | Admit: 2015-08-19 | Discharge: 2015-08-19 | Disposition: A | Discharge status: Home / Self Care | Payer: Self-pay | Attending: Emergency Medicine | Admitting: Emergency Medicine

## 2015-08-19 ENCOUNTER — Encounter (HOSPITAL_COMMUNITY): Payer: Self-pay | Admitting: *Deleted

## 2015-08-19 DIAGNOSIS — S61512A Laceration without foreign body of left wrist, initial encounter: Secondary | ICD-10-CM | POA: Insufficient documentation

## 2015-08-19 DIAGNOSIS — Y9389 Activity, other specified: Secondary | ICD-10-CM | POA: Insufficient documentation

## 2015-08-19 DIAGNOSIS — Y9289 Other specified places as the place of occurrence of the external cause: Secondary | ICD-10-CM | POA: Insufficient documentation

## 2015-08-19 DIAGNOSIS — Y998 Other external cause status: Secondary | ICD-10-CM | POA: Insufficient documentation

## 2015-08-19 DIAGNOSIS — Z87891 Personal history of nicotine dependence: Secondary | ICD-10-CM | POA: Insufficient documentation

## 2015-08-19 DIAGNOSIS — Z79899 Other long term (current) drug therapy: Secondary | ICD-10-CM | POA: Insufficient documentation

## 2015-08-19 DIAGNOSIS — W010XXA Fall on same level from slipping, tripping and stumbling without subsequent striking against object, initial encounter: Secondary | ICD-10-CM | POA: Insufficient documentation

## 2015-08-19 DIAGNOSIS — J45909 Unspecified asthma, uncomplicated: Secondary | ICD-10-CM | POA: Insufficient documentation

## 2015-08-19 NOTE — ED Notes (Signed)
Pt refuses stitches and states he doesn't want to stay any longer and wishes to leave.

## 2015-08-19 NOTE — ED Notes (Signed)
Pt reports falling today and now has a laceration to his left wrist. Bleeding controlled in triage.

## 2015-08-19 NOTE — ED Notes (Signed)
Pt is in stable condition upon d/c and ambulates from ED. 

## 2015-08-19 NOTE — ED Provider Notes (Signed)
CSN: [id number]     Arrival date & time 08/19/15  1319 History  By signing my name below, I, Tanda Rockers, attest that this documentation has been prepared under the direction and in the presence of [employer], PA-C. Electronically Signed: Tanda Rockers, ED Scribe. 08/19/2015. 1:50 PM.  Chief Complaint  Patient presents with  . Extremity Laceration   The history is provided by the patient. No language interpreter was used.     HPI Comments: Steven [name] is a 24 y.o. male who presents to the Emergency Department complaining of sudden onset left wrist laceration that occurred yesterday at 7 AM (approximately 30.5 hours ago). Pt states he was chasing his dog when he tripped and fell on the ground, causing the cut to his wrist. Bleeding controlled. He mentions increased pain to the area but is otherwise complaint free. He denies any treatment prior to arrival. Denies weakness, numbness, tingling, or any other associated symptoms. Tetanus up to date.    Past Medical History  Diagnosis Date  . Severe persistent asthma   . Bronchitis   . Febrile seizure   . Allergic rhinitis   . Marijuana abuse   . Hx of tobacco use, presenting hazards to health    Past Surgical History  Procedure Laterality Date  . Tonsillectomy  2008   Family History  Problem Relation Age of Onset  . Asthma Mother   . Cancer Mother   . Asthma Brother    Social History  Substance Use Topics  . Smoking status: Former Smoker    Types: Cigarettes    Quit date: 11/22/2011  . Smokeless tobacco: None  . Alcohol Use: 0.6 oz/week    1 Cans of beer per week     Comment: Occasional    Review of Systems  Skin: Positive for wound (Laceration to left wrist). Negative for color change.  Neurological: Negative for weakness and numbness.    Allergies  Review of patient's allergies indicates no known allergies.  Home Medications   Prior to Admission medications   Medication Sig Start Date End Date Taking?  Authorizing Provider  albuterol (PROVENTIL HFA;VENTOLIN HFA) 108 (90 BASE) MCG/ACT inhaler Inhale 2 puffs into the lungs every 4 (four) hours as needed for wheezing or shortness of breath. 09/17/12   Laveda Norman, MD  fluticasone (FLONASE) 50 MCG/ACT nasal spray Place 1 spray into the nose daily as needed for allergies. 09/17/12   Laveda Norman, MD  guaiFENesin (MUCINEX) 600 MG 12 hr tablet Take 1 tablet (600 mg total) by mouth 2 (two) times daily. 09/17/12   Laveda Norman, MD  HYDROcodone-acetaminophen (NORCO/VICODIN) 5-325 MG per tablet Take 1 tablet by mouth every 6 (six) hours as needed for pain. 01/24/13   Lisette Paz, PA-C  montelukast (SINGULAIR) 10 MG tablet Take 1 tablet (10 mg total) by mouth at bedtime. 09/17/12   Laveda Norman, MD  moxifloxacin (AVELOX) 400 MG tablet Take 1 tablet (400 mg total) by mouth daily. 09/17/12   Laveda Norman, MD  predniSONE (DELTASONE) 10 MG tablet Take 40 mg daily for 3 days, then 30 mg daily for 3 days, then 20 mg daily for 3 days, then 10 mg daily for 3 days, then stop. 09/17/12   Laveda Norman, MD   Triage Vitals: BP 106/83 mmHg  Pulse 91  Temp(Src) 97.8 F (36.6 C) (Oral)  Resp 16  SpO2 97%   Physical Exam  Constitutional: He is oriented to person, place, and time. He  appears well-developed and well-nourished. No distress.  HENT:  Head: Normocephalic and atraumatic.  Eyes: Conjunctivae and EOM are normal.  Neck: Neck supple. No tracheal deviation present.  Cardiovascular: Normal rate.   Pulmonary/Chest: Effort normal. No respiratory distress.  Musculoskeletal: Normal range of motion.  1.5 cm laceration along the volar surface of the left wrist without active bleeding and no surrounding erythema The wound does not appear to be deep and is only through the epidermis He has 2+ radial pulse Able to flex and extend wrist and all fingers  Neurological: He is alert and oriented to person, place, and time.  Skin: Skin is warm and dry.  Psychiatric: He has a  normal mood and affect. His behavior is normal.  Nursing note and vitals reviewed.   ED Course  Procedures (including critical care time)  DIAGNOSTIC STUDIES: Oxygen Saturation is 97% on RA, normal by my interpretation.    COORDINATION OF CARE: 1:48 PM-Discussed treatment plan which includes suture placement. Pt does not want intervention for laceration that this time due to being in a hurry to get to work. He is asking to have wound clean and bandaged. Will agree to this plan.   Labs Review Labs Reviewed - No data to display  Imaging Review No results found.    EKG Interpretation None      MDM   Final diagnoses:  Wrist laceration, left, initial encounter  Patient presents for left wrist laceration. Upon examination patient stated that he had to leave and would like to have the wound wrapped. The laceration was superficial and only involved the epidermis. It had been 30 hours since the laceration occurred. Steri-Strips were applied after the wound was cleaned. Patient left before receiving discharge paperwork and return precautions. Filed Vitals:   08/19/15 1329  BP: 106/83  Pulse: 91  Temp: 97.8 F (36.6 C)  Resp: 16   I personally performed the services described in this documentation, which was scribed in my presence. The recorded information has been reviewed and is accurate.     Catha Gosselin, PA-C 08/19/15 1403  Gerhard Munch, MD 08/19/15 [phone]

## 2015-08-19 NOTE — Discharge Instructions (Signed)
Laceration Care, Adult Follow-up with a primary care provider using the resource guide below. Return for swelling, redness, or drainage from the laceration site. Keep the wound clean and dry. A laceration is a cut that goes through all layers of the skin. The cut goes into the tissue beneath the skin. HOME CARE For stitches (sutures) or staples:  Keep the cut clean and dry.  If you have a bandage (dressing), change it at least once a day. Change the bandage if it gets wet or dirty, or as told by your doctor.  Wash the cut with soap and water 2 times a day. Rinse the cut with water. Pat it dry with a clean towel.  Put a thin layer of medicated cream on the cut as told by your doctor.  You may shower after the first 24 hours. Do not soak the cut in water until the stitches are removed.  Only take medicines as told by your doctor.  Have your stitches or staples removed as told by your doctor. For skin adhesive strips:  Keep the cut clean and dry.  Do not get the strips wet. You may take a bath, but be careful to keep the cut dry.  If the cut gets wet, pat it dry with a clean towel.  The strips will fall off on their own. Do not remove the strips that are still stuck to the cut. For wound glue:  You may shower or take baths. Do not soak or scrub the cut. Do not swim. Avoid heavy sweating until the glue falls off on its own. After a shower or bath, pat the cut dry with a clean towel.  Do not put medicine on your cut until the glue falls off.  If you have a bandage, do not put tape over the glue.  Avoid lots of sunlight or tanning lamps until the glue falls off. Put sunscreen on the cut for the first year to reduce your scar.  The glue will fall off on its own. Do not pick at the glue. You may need a tetanus shot if:  You cannot remember when you had your last tetanus shot.  You have never had a tetanus shot. If you need a tetanus shot and you choose not to have one, you may get  tetanus. Sickness from tetanus can be serious. GET HELP RIGHT AWAY IF:   Your pain does not get better with medicine.  Your arm, hand, leg, or foot loses feeling (numbness) or changes color.  Your cut is bleeding.  Your joint feels weak, or you cannot use your joint.  You have painful lumps on your body.  Your cut is red, puffy (swollen), or painful.  You have a red line on the skin near the cut.  You have yellowish-white fluid (pus) coming from the cut.  You have a fever.  You have a bad smell coming from the cut or bandage.  Your cut breaks open before or after stitches are removed.  You notice something coming out of the cut, such as wood or glass.  You cannot move a finger or toe. MAKE SURE YOU:   Understand these instructions.  Will watch your condition.  Will get help right away if you are not doing well or get worse. Document Released: 04/25/2008 Document Revised: 01/30/2012 Document Reviewed: 05/03/2011 [facility] Patient Information 2015 [place], [place]. This information is not intended to replace advice given to you by your health care provider. Make sure you discuss any  questions you have with your health care provider.  Emergency Department Resource Guide 1) Find a Doctor and Pay Out of Pocket Although you won't have to find out who is covered by your insurance plan, it is a good idea to ask around and get recommendations. You will then need to call the office and see if the doctor you have chosen will accept you as a new patient and what types of options they offer for patients who are self-pay. Some doctors offer discounts or will set up payment plans for their patients who do not have insurance, but you will need to ask so you aren't surprised when you get to your appointment.  2) Contact Your Local Health Department Not all health departments have doctors that can see patients for sick visits, but many do, so it is worth a call to see if yours does. If you  don't know where your local health department is, you can check in your phone book. The CDC also has a tool to help you locate your state's health department, and many state websites also have listings of all of their local health departments.  3) Find a Walk-in Clinic If your illness is not likely to be very severe or complicated, you may want to try a walk in clinic. These are popping up all over the country in pharmacies, drugstores, and shopping centers. They're usually staffed by nurse practitioners or physician assistants that have been trained to treat common illnesses and complaints. They're usually fairly quick and inexpensive. However, if you have serious medical issues or chronic medical problems, these are probably not your best option.  No Primary Care Doctor: - Call Health Connect at  [phone] - they can help you locate a primary care doctor that  accepts your insurance, provides certain services, etc. - Physician Referral Service- [phone]  Chronic Pain Problems: Organization         Address  Phone   Notes  [facility]  [phone] Patients need to be referred by their primary care doctor.   Medication Assistance: Organization         Address  Phone   Notes  [facility] Medication [facility] [address] [place] [place], [place] [address] [phone] --Must be a resident of [facility] -- Must have NO insurance coverage whatsoever (no Medicaid/ Medicare, etc.) -- The pt. MUST have a primary care doctor that directs their care regularly and follows them in the community   [facility]  [phone]   [employer]  [phone]    Agencies that provide inexpensive medical care: Organization         Address  Phone   Notes  [facility]  [phone]   [facility]    [phone]   [facility] [address] [place], [place] [address] [phone]   [facility] [address] [place] [phone]   [facility]anned Parenthood    [phone]   [facility]    [phone]   [facility]  [address], [place] Phone:  [phone], Fax:  [phone] Hours of Operation:  9 am - 6 pm, M-F.  Also accepts Medicaid/Medicare and self-pay.  [facility]  [address], [place] Phone: [phone], Fax: [phone]. Hours of Operation:  8:30 am - 5:30 pm, M-F.  Also accepts Medicaid and self-pay.  [facility] [place] [address] [place] Phone: [phone]   [facility] [address] Natasha Bence [place], [place] [phone], Ext. [phone] Mondays & Thursdays: 7-9 AM.  First 15 patients are seen on a first come, first serve basis.    Medicaid-accepting [facility] Providers:  Organization         Address  Phone   Notes  [facility] [address], [place] [phone] Also accepts self-pay patients.  [facility] [address] Laurell Josephs [place], [place]  [phone]   [facility] [address], [place] [phone]   [facility] [address] [place] [phone]   Renaye Rakers [address], [place]   [phone] Only accepts [place] Access [place] patients after they have their name applied to their card.   Self-Pay (no insurance) in [facility]:  Organization         Address  Phone   Notes  Sickle Cell Patients, [facility] [address] [place], [place] [phone]   [facility] [address] [place], [place] [phone]   [facility] [place]  [address], [place] [phone]   [facility]/Dr. Osei-Bonsu  [address] [place] or [address], [place] [phone] Phone  number for both [place] and [place] locations is the same.  [facility] [address], [place] [phone]   [facility] [address], [place] or [address] [phone]   [facility] [address] [place] [phone], phone; [phone], fax Sees patients 1st and 3rd Saturday of every month.  Must not qualify for public or private insurance (i.e. Medicaid, Medicare, [place] Health Choice, Veterans' Benefits)  Household income should be no more than 200% of the poverty level The clinic cannot treat you if you are pregnant or think you are pregnant  Sexually transmitted diseases are not treated at the clinic.    Dental Care: Organization         Address  Phone  Notes  [facility] Department of [facility] [address] [place], [place] [phone] Accepts children up to age 17 who are enrolled in [place] or [place] Health Choice; pregnant women with a Medicaid card; and children who have applied for Medicaid or [place] Health Choice, but were declined, whose parents can pay a reduced fee at time of service.  [facility]  [address], [place] [phone] Accepts children up to age 29 who are enrolled in [place] or [place] Health Choice; pregnant women with a Medicaid card; and children who have applied for Medicaid or [place] Health Choice, but were declined, whose parents can pay a reduced fee at time of service.  [facility]  [address] [place], [place] [phone] Patients are seen by appointment only. Walk-ins are not accepted. [facility] will see patients 59 years of age and older. Monday - Tuesday (8am-5pm) Most Wednesdays (8:30-5pm) $30 per visit, cash only  [facility]  [address], [facility] [phone] Patients are seen by appointment only.  Walk-ins are not accepted. [facility]  will see patients 90 years of age and older. One Wednesday Evening (Monthly: Volunteer Based).  $30 per visit, cash only  [employer]  [phone] for adults; Children under age 16, call Graduate Pediatric Dentistry at [phone]. Children aged 109-14, please call [phone] to request a pediatric application.  Dental services are provided in all areas of dental care including fillings, crowns and bridges, complete and partial dentures, implants, gum treatment, root canals, and extractions. Preventive care is also provided. Treatment is provided to both adults and children. Patients are selected via a lottery and there is often a waiting list.   [facility] [address], [place]  [phone] [email or web address]   [facility] [address] [place], [place] [phone], Ext. [phone] Second and Fourth Thursday of each month, opens at 6:30 AM; Clinic ends at 9 AM.  Patients are seen on a first-come first-served basis, and a limited number are seen during each clinic.   [facility]  [address] Ether Griffins [place], [place] [phone]   Eligibility Requirements You must have lived in [place], [place], or [place] counties for at least the last three months.   You cannot be eligible for state or federal sponsored [place], including [employer], [place], or [employer].   You generally cannot be eligible for healthcare insurance through your employer.    How to apply: Eligibility screenings are held every Tuesday and Wednesday afternoon from 1:00 pm until 4:00 pm. You do not need an appointment for the interview!  [facility] [address], [place], [place] [phone]   [facility] Health Department  [phone]   [facility]  [phone]   [facility] Health Department  [phone]    Behavioral Health  Resources in the Community: Intensive Outpatient Programs Organization         Address  Phone  Notes  [facility] [address], [place], [place] [phone]   [facility] Outpatient [address] [place], [place] [phone]   [facility] [address], [place], [place]  [phone]   [facility] Mental Health [address]  [place], [place] [phone] or [phone]   Substance Abuse Resources Organization         Address  Phone  Notes  [facility] Drug Services  [phone]   [facility]  [phone]   [facility]  [phone]   Floydene Flock  [phone]   Residential & Outpatient Substance Abuse Program  [phone]   Psychological Services Organization         Address  Phone  Notes  [facility]  [phone]   [facility] Services  [phone]   [facility] [address] [place] [phone] or [phone]    Mobile Crisis Teams Organization         Address  Phone  Notes  [facility], Mobile Crisis Care Unit  [phone]   [facility]  [address] [place], [place] [phone]   Doristine Locks [address] [place] [phone]    Self-Help/Support Groups Organization         Address  Phone             Notes  [facility] [place] - variety of support groups  336- I7437963 Call for more information  Narcotics Anonymous (NA), [facility] [address],  [place]  2 meetings at this location   Residential Treatment Programs Organization         Address  Phone  Notes  [facility] [address],    [place]  [phone]   [facility]  [address], [place] [address], [place], [place] [phone]   [facility] [address] [place], [place] [phone] Admissions: 8am-3pm M-F  [facility] [address],    [place], [place] [phone]   [facility] [address] [place], [place], [place] [phone]   [facility] [address],  [place], [place] [phone]   [facility] - Intensive Outpatient [address] Laurell Josephs [address], [place], [place] [phone]   [facility] (Addiction Recovery [facility] [address] [place]  [place], [place] [phone] or [phone]   [facility] ([facility]) [address], [place], [place] [phone] Accepts Medicaid  [facility] [address]  [place] [phone] Substance Abuse/Addiction Treatment   [facility] Organization         Address  Phone  Notes  [facility]  [phone]   Angie Fava, PhD [address] Ervin Knack [employer], [place]   [phone] or [phone]   [facility] Behavioral   [address] [place], [place] [phone]   [facility] [address], [place], [place] [phone] Insurance/Medicaid/sponsorship through [facility] and Families [address]                                    [place], [place] [phone] Therapy/tele-psych/case  [facility] [address], [place] [phone]    Dr. Lolly Mustache  [phone]   [facility]  [employer] [facility] Dept. 1) [address] [place] 2) [address] [place] 3)  [address], [place] [phone]  [phone]   [facility] Child Abuse Hotline 519-322-1011 or [phone] (After Hours)

## 2015-09-17 ENCOUNTER — Encounter (HOSPITAL_COMMUNITY): Payer: Self-pay | Admitting: Emergency Medicine

## 2015-09-17 ENCOUNTER — Emergency Department (HOSPITAL_COMMUNITY)
Admission: EM | Admit: 2015-09-17 | Discharge: 2015-09-17 | Discharge status: Against Medical Advice | Payer: Self-pay | Attending: Emergency Medicine | Admitting: Emergency Medicine

## 2015-09-17 DIAGNOSIS — S61011A Laceration without foreign body of right thumb without damage to nail, initial encounter: Secondary | ICD-10-CM | POA: Insufficient documentation

## 2015-09-17 DIAGNOSIS — Z87891 Personal history of nicotine dependence: Secondary | ICD-10-CM | POA: Insufficient documentation

## 2015-09-17 DIAGNOSIS — Y9389 Activity, other specified: Secondary | ICD-10-CM | POA: Insufficient documentation

## 2015-09-17 DIAGNOSIS — Y998 Other external cause status: Secondary | ICD-10-CM | POA: Insufficient documentation

## 2015-09-17 DIAGNOSIS — J45909 Unspecified asthma, uncomplicated: Secondary | ICD-10-CM | POA: Insufficient documentation

## 2015-09-17 DIAGNOSIS — W260XXA Contact with knife, initial encounter: Secondary | ICD-10-CM | POA: Insufficient documentation

## 2015-09-17 DIAGNOSIS — Y9289 Other specified places as the place of occurrence of the external cause: Secondary | ICD-10-CM | POA: Insufficient documentation

## 2015-09-17 DIAGNOSIS — Z79899 Other long term (current) drug therapy: Secondary | ICD-10-CM | POA: Insufficient documentation

## 2015-09-17 MED ORDER — LIDOCAINE HCL (PF) 1 % IJ SOLN: 5.0000 mL | Freq: Once | INTRAMUSCULAR | Status: DC

## 2015-09-17 NOTE — ED Provider Notes (Signed)
CSN: [id number]     Arrival date & time 09/17/15  0920 History  By signing my name below, I, [name], attest that this documentation has been prepared under the direction and in the presence of Catha Gosselin, PA-C Electronically Signed: [name], ED Scribe. 09/17/2015. 10:24 AM.   Chief Complaint  Patient presents with  . Laceration      The history is provided by the patient and a relative. No language interpreter was used.    Steven [name] is a 24 y.o. male who presents to the Emergency Department complaining of right thumb laceration onset 20 minutes ago PTA. He notes that he was trying to put a hole in his belt with a pocket knife when the knife slipped and cut his right thumb. He reports that he has had a tetanus shot within the last ten years. He states that he has tried applying pressure to control bleeding without medication for the relief of his symptoms. He denies color change, joint swelling, and any other symptoms.   Past Medical History  Diagnosis Date  . Severe persistent asthma   . Bronchitis   . Allergic rhinitis   . Marijuana abuse   . Hx of tobacco use, presenting hazards to health    Past Surgical History  Procedure Laterality Date  . Tonsillectomy  2008   Family History  Problem Relation Age of Onset  . Asthma Mother   . Cancer Mother   . Asthma Brother    Social History  Substance Use Topics  . Smoking status: Former Smoker    Types: Cigarettes    Quit date: 11/22/2011  . Smokeless tobacco: None  . Alcohol Use: 0.6 oz/week    1 Cans of beer per week     Comment: Occasional    Review of Systems  Musculoskeletal: Positive for myalgias. Negative for joint swelling.  Skin: Positive for wound. Negative for color change.  All other systems reviewed and are negative.     Allergies  Review of patient's allergies indicates no known allergies.  Home Medications   Prior to Admission medications   Medication Sig Start Date End Date  Taking? Authorizing Provider  albuterol (PROVENTIL HFA;VENTOLIN HFA) 108 (90 BASE) MCG/ACT inhaler Inhale 2 puffs into the lungs every 4 (four) hours as needed for wheezing or shortness of breath. 09/17/12   Laveda Norman, MD  fluticasone (FLONASE) 50 MCG/ACT nasal spray Place 1 spray into the nose daily as needed for allergies. 09/17/12   Laveda Norman, MD  guaiFENesin (MUCINEX) 600 MG 12 hr tablet Take 1 tablet (600 mg total) by mouth 2 (two) times daily. 09/17/12   Laveda Norman, MD  HYDROcodone-acetaminophen (NORCO/VICODIN) 5-325 MG per tablet Take 1 tablet by mouth every 6 (six) hours as needed for pain. 01/24/13   Lisette Paz, PA-C  montelukast (SINGULAIR) 10 MG tablet Take 1 tablet (10 mg total) by mouth at bedtime. 09/17/12   Laveda Norman, MD  moxifloxacin (AVELOX) 400 MG tablet Take 1 tablet (400 mg total) by mouth daily. 09/17/12   Laveda Norman, MD  predniSONE (DELTASONE) 10 MG tablet Take 40 mg daily for 3 days, then 30 mg daily for 3 days, then 20 mg daily for 3 days, then 10 mg daily for 3 days, then stop. 09/17/12   Laveda Norman, MD   BP 143/84 mmHg  Pulse 76  Temp(Src) 97.3 F (36.3 C) (Oral)  Resp 16  SpO2 100% Physical Exam  Constitutional: He is  oriented to person, place, and time. He appears well-developed and well-nourished. No distress.  HENT:  Head: Normocephalic and atraumatic.  Eyes: EOM are normal.  Neck: Neck supple.  Cardiovascular: Normal rate.   Pulses:      Radial pulses are 2+ on the right side.  Pulmonary/Chest: Effort normal. No respiratory distress.  Musculoskeletal: Normal range of motion.  Neurological: He is alert and oriented to person, place, and time.  Skin: Skin is warm and dry. Laceration noted.  Right thumb 1.5 cm laceration with no active bleeding. Wound is through the epidermis and partial dermis but no bone could be visualized. Able to flex and extend finger. 2+radial pulse. Cap refill less than 2 seconds.  Psychiatric: He has a normal mood and affect.  His behavior is normal.  Nursing note and vitals reviewed.   ED Course  Procedures (including critical care time) DIAGNOSTIC STUDIES: Oxygen Saturation is 100% on RA, nl by my interpretation.    COORDINATION OF CARE: 9:52 AM Discussed treatment plan with pt at bedside which includes laceration repair and pt agreed to plan.  10:15 AM- Pt walked up to Federated Department StoresHanna Patel-Mills, PA-C inquiring on how long it would take to have his finger repaired. After being informed that he was waiting on the medicine from pharmacy, pt was instructed to return to his room.   10:23 AM- Pt walks out of the room while knocking over a container of saline/hydrogen peroxide onto the floor. When asked if the pt was leaving, he stated that he was while walking out of the ED.    Labs Review Labs Reviewed - No data to display  Imaging Review No results found.   EKG Interpretation None      MDM   Final diagnoses:  Laceration of right thumb, initial encounter  Patient presents for right thumb laceration that was done accidentally with a knife. Wound was soaked in saline and peroxide. Patient stated he did not want to wait to have finger sutured and walked out of the ED without discharge paperwork or letting the nurse know that he was leaving. On his way out, he tipped over a basin with saline and peroxide on the floor.  I personally performed the services described in this documentation, which was scribed in my presence. The recorded information has been reviewed and is accurate.    Catha GosselinHanna Patel-Mills, PA-C 09/17/15 1039  Tilden FossaElizabeth Rees, MD 09/18/15 1304

## 2015-09-17 NOTE — ED Notes (Signed)
Patient states was putting a hole in his belt and cut his R thumb with a pocket knife.   Bleeding controlled at triage.

## 2015-09-17 NOTE — ED Notes (Signed)
Patient got mad and threw hand cleaning solution all over the floor.  Patient was upset with having to soak hand and wait for lidocaine.    Patient walked out and left.

## 2015-09-17 NOTE — ED Notes (Signed)
Patient states he "made up that I have seizures.  I don't have them.   I just made it up cause I wanted to be out for a while".

## 2015-09-17 NOTE — ED Notes (Signed)
Pt quickly left [facility] saying he's leaving. About 1L of Hydrogen Peroxide/Saline solution and pink basin spilled onto floor by pt.  Pt's family member in room washing her hands and leaves shortly after.

## 2015-09-18 ENCOUNTER — Encounter (HOSPITAL_COMMUNITY): Payer: Self-pay | Admitting: Emergency Medicine

## 2015-09-18 ENCOUNTER — Emergency Department (INDEPENDENT_AMBULATORY_CARE_PROVIDER_SITE_OTHER)
Admission: EM | Admit: 2015-09-18 | Discharge: 2015-09-18 | Disposition: A | Discharge status: Home / Self Care | Payer: Self-pay | Source: Home / Self Care | Attending: Family Medicine | Admitting: Family Medicine

## 2015-09-18 DIAGNOSIS — S61011A Laceration without foreign body of right thumb without damage to nail, initial encounter: Secondary | ICD-10-CM

## 2015-09-18 MED ORDER — POVIDONE-IODINE 10 % EX SOLN
CUTANEOUS | Status: AC
  Filled 2015-09-18: qty 118

## 2015-09-18 MED ORDER — BUPIVACAINE HCL (PF) 0.5 % IJ SOLN
INTRAMUSCULAR | Status: AC
  Filled 2015-09-18: qty 10

## 2015-09-18 NOTE — ED Notes (Signed)
Pt reports laceration to right thumb onset 1845 Reports he was trying to put a hole into his belt w/a straight knife Bleeding controlled Last tetanus 06/2015 (released from prison) A&O x4... No acute distress.

## 2015-09-18 NOTE — Discharge Instructions (Signed)
Laceration Care, Adult  A laceration is a cut that goes through all layers of the skin. The cut also goes into the tissue that is right under the skin. Some cuts heal on their own. Others need to be closed with stitches (sutures), staples, skin adhesive strips, or wound glue. Taking care of your cut lowers your risk of infection and helps your cut to heal better.  HOW TO TAKE CARE OF YOUR CUT  For stitches or staples:  · Keep the wound clean and dry.  · If you were given a bandage (dressing), you should change it at least one time per day or as told by your doctor. You should also change it if it gets wet or dirty.  · Keep the wound completely dry for the first 24 hours or as told by your doctor. After that time, you may take a shower or a bath. However, make sure that the wound is not soaked in water until after the stitches or staples have been removed.  · Clean the wound one time each day or as told by your doctor:    Wash the wound with soap and water.    Rinse the wound with water until all of the soap comes off.    Pat the wound dry with a clean towel. Do not rub the wound.  · After you clean the wound, put a thin layer of antibiotic ointment on it as told by your doctor. This ointment:    Helps to prevent infection.    Keeps the bandage from sticking to the wound.  · Have your stitches or staples removed as told by your doctor.  If your doctor used skin adhesive strips:   · Keep the wound clean and dry.  · If you were given a bandage, you should change it at least one time per day or as told by your doctor. You should also change it if it gets dirty or wet.  · Do not get the skin adhesive strips wet. You can take a shower or a bath, but be careful to keep the wound dry.  · If the wound gets wet, pat it dry with a clean towel. Do not rub the wound.  · Skin adhesive strips fall off on their own. You can trim the strips as the wound heals. Do not remove any strips that are still stuck to the wound. They will  fall off after a while.  If your doctor used wound glue:  · Try to keep your wound dry, but you may briefly wet it in the shower or bath. Do not soak the wound in water, such as by swimming.  · After you take a shower or a bath, gently pat the wound dry with a clean towel. Do not rub the wound.  · Do not do any activities that will make you really sweaty until the skin glue has fallen off on its own.  · Do not apply liquid, cream, or ointment medicine to your wound while the skin glue is still on.  · If you were given a bandage, you should change it at least one time per day or as told by your doctor. You should also change it if it gets dirty or wet.  · If a bandage is placed over the wound, do not let the tape for the bandage touch the skin glue.  · Do not pick at the glue. The skin glue usually stays on for 5-10 days. Then, it   or when wound glue stays in place and the wound is healed. Make sure to wear a sunscreen of at least 30 SPF. °· Take over-the-counter and prescription medicines only as told by your doctor. °· If you were given antibiotic medicine or ointment, take or apply it as told by your doctor. Do not stop using the antibiotic even if your wound is getting better. °· Do not scratch or pick at the wound. °· Keep all follow-up visits as told by your doctor. This is important. °· Check your wound every day for signs of infection. Watch for: °· Redness, swelling, or pain. °· Fluid, blood, or pus. °· Raise (elevate) the injured area above the level of your heart while you are sitting or lying down, if possible. °GET HELP IF: °· You got a tetanus shot and you have any of these problems at the injection site: °¨ Swelling. °¨ Very bad pain. °¨ Redness. °¨ Bleeding. °· You have a fever. °· A wound that was  closed breaks open. °· You notice a bad smell coming from your wound or your bandage. °· You notice something coming out of the wound, such as wood or glass. °· Medicine does not help your pain. °· You have more redness, swelling, or pain at the site of your wound. °· You have fluid, blood, or pus coming from your wound. °· You notice a change in the color of your skin near your wound. °· You need to change the bandage often because fluid, blood, or pus is coming from the wound. °· You start to have a new rash. °· You start to have numbness around the wound. °GET HELP RIGHT AWAY IF: °· You have very bad swelling around the wound. °· Your pain suddenly gets worse and is very bad. °· You notice painful lumps near the wound or on skin that is anywhere on your body. °· You have a red streak going away from your wound. °· The wound is on your hand or foot and you cannot move a finger or toe like you usually can. °· The wound is on your hand or foot and you notice that your fingers or toes look pale or bluish. °  °This information is not intended to replace advice given to you by your health care provider. Make sure you discuss any questions you have with your health care provider. °  °Document Released: 04/25/2008 Document Revised: 03/24/2015 Document Reviewed: 11/03/2014 °Elsevier Interactive Patient Education ©2016 Else[employer] Inc[employer] ° °Sutured Wound Care °Sutures are stitches that can be used to close wounds. Taking care of your wound properly can help to prevent pain and infection. It can also help your wound to heal more quickly. °HOW TO CARE FOR YOUR SUTURED WOUND °Wound Care °· Keep the wound clean and dry. °· If you were given a bandage (dressing), you should change it at least once per day or as directed by your health care provider. You should also change it if it becomes wet or dirty. °· Keep the wound completely dry for the first 24 hours or as directed by your health care provider. After that time, you may shower  or bathe. However, make sure that the wound is not soaked in water until the sutures have been removed. °· Clean the wound one time each day or as directed by your health care provider. °¨ Wash the wound with soap and water. °¨ Rinse the wound with water to remove all soap. °¨ Pat the wound dry with   a clean towel. Do not rub the wound.  Aftercleaning the wound, apply a thin layer of antibioticointment as directed by your health care provider. This will help to prevent infection and keep the dressing from sticking to the wound.  Have the sutures removed as directed by your health care provider. General Instructions  Take or apply medicines only as directed by your health care provider.  To help prevent scarring, make sure to cover your wound with sunscreen whenever you are outside after the sutures are removed and the wound is healed. Make sure to wear a sunscreen of at least 30 SPF.  If you were prescribed an antibiotic medicine or ointment, finish all of it even if you start to feel better.  Do not scratch or pick at the wound.  Keep all follow-up visits as directed by your health care provider. This is important.  Check your wound every day for signs of infection. Watch for:   Redness, swelling, or pain.  Fluid, blood, or pus.  Raise (elevate) the injured area above the level of your heart while you are sitting or lying down, if possible.  Avoid stretching your wound.  Drink enough fluids to keep your urine clear or pale yellow. SEEK MEDICAL CARE IF:  You received a tetanus shot and you have swelling, severe pain, redness, or bleeding at the injection site.  You have a fever.  A wound that was closed breaks open.  You notice a bad smell coming from the wound.  You notice something coming out of the wound, such as wood or glass.  Your pain is not controlled with medicine.  You have increased redness, swelling, or pain at the site of your wound.  You have fluid, blood,  or pus coming from your wound.  You notice a change in the color of your skin near your wound.  You need to change the dressing frequently due to fluid, blood, or pus draining from the wound.  You develop a new rash.  You develop numbness around the wound. SEEK IMMEDIATE MEDICAL CARE IF:  You develop severe swelling around the injury site.  Your pain suddenly increases and is severe.  You develop painful lumps near the wound or on skin that is anywhere on your body.  You have a red streak going away from your wound.  The wound is on your hand or foot and you cannot properly move a finger or toe.  The wound is on your hand or foot and you notice that your fingers or toes look pale or bluish.   This information is not intended to replace advice given to you by your health care provider. Make sure you discuss any questions you have with your health care provider.   Document Released: 12/15/2004 Document Revised: 03/24/2015 Document Reviewed: 06/19/2013 Elsevier Interactive Patient Education [employer] Else[employer] Inc[employer]

## 2015-09-18 NOTE — ED Provider Notes (Signed)
CSN: [id number]     Arrival date & time 09/18/15  1818 History   First MD Initiated Contact with Patient 09/18/15 1943     Chief Complaint  Patient presents with  . Extremity Laceration   (Consider location/radiation/quality/duration/timing/severity/associated sxs/prior Treatment) HPI Comments: 24 year old male was trying to drill another hole into his belt with a pocket knife. Denies slipped and sliced across the distal aspect of the right thumb. This created a curvilinear laceration approximately 2-1/2-3 cm in length. By the time of her arrival there was no bleeding.   Past Medical History  Diagnosis Date  . Severe persistent asthma   . Bronchitis   . Allergic rhinitis   . Marijuana abuse   . Hx of tobacco use, presenting hazards to health    Past Surgical History  Procedure Laterality Date  . Tonsillectomy  2008   Family History  Problem Relation Age of Onset  . Asthma Mother   . Cancer Mother   . Asthma Brother    Social History  Substance Use Topics  . Smoking status: Former Smoker    Types: Cigarettes    Quit date: 11/22/2011  . Smokeless tobacco: None  . Alcohol Use: 0.6 oz/week    1 Cans of beer per week     Comment: Occasional    Review of Systems  Constitutional: Negative.   Musculoskeletal: Negative for joint swelling.  Skin: Positive for wound.       As per history of present illness.  Neurological: Negative.   All other systems reviewed and are negative.   Allergies  Review of patient's allergies indicates no known allergies.  Home Medications   Prior to Admission medications   Medication Sig Start Date End Date Taking? Authorizing Provider  albuterol (PROVENTIL HFA;VENTOLIN HFA) 108 (90 BASE) MCG/ACT inhaler Inhale 2 puffs into the lungs every 4 (four) hours as needed for wheezing or shortness of breath. 09/17/12  Yes Laveda Normanhris N Oti, MD  fluticasone (FLONASE) 50 MCG/ACT nasal spray Place 1 spray into the nose daily as needed for allergies. 09/17/12    Laveda Normanhris N Oti, MD  guaiFENesin (MUCINEX) 600 MG 12 hr tablet Take 1 tablet (600 mg total) by mouth 2 (two) times daily. 09/17/12   Laveda Normanhris N Oti, MD  HYDROcodone-acetaminophen (NORCO/VICODIN) 5-325 MG per tablet Take 1 tablet by mouth every 6 (six) hours as needed for pain. 01/24/13   Lisette Paz, PA-C  montelukast (SINGULAIR) 10 MG tablet Take 1 tablet (10 mg total) by mouth at bedtime. 09/17/12   Laveda Normanhris N Oti, MD  moxifloxacin (AVELOX) 400 MG tablet Take 1 tablet (400 mg total) by mouth daily. 09/17/12   Laveda Normanhris N Oti, MD  predniSONE (DELTASONE) 10 MG tablet Take 40 mg daily for 3 days, then 30 mg daily for 3 days, then 20 mg daily for 3 days, then 10 mg daily for 3 days, then stop. 09/17/12   Laveda Normanhris N Oti, MD   Meds Ordered and Administered this Visit  Medications - No data to display  BP 137/79 mmHg  Pulse 63  Temp(Src) 98.2 F (36.8 C) (Oral)  Resp 18  SpO2 97% No data found.   Physical Exam  Constitutional: He is oriented to person, place, and time. He appears well-developed and well-nourished. No distress.  Neck: Normal range of motion. Neck supple.  Pulmonary/Chest: Effort normal. No respiratory distress.  Musculoskeletal:  Prior to anesthesia examination of the thumb reveals laceration as described in history of present illness. Distal sensation is intact. Motor is  intact. Flexion and extension of the IP joint is completely intact against resistance.  Neurological: He is alert and oriented to person, place, and time. He exhibits normal muscle tone.  Skin: Skin is warm and dry.  Psychiatric: He has a normal mood and affect.  Nursing note and vitals reviewed.   ED Course  .Marland KitchenLaceration Repair Date/Time: 09/18/2015 9:27 PM Performed by: Phineas Real, Richmond Coldren Authorized by: Bradd Canary D Consent: Verbal consent obtained. Risks and benefits: risks, benefits and alternatives were discussed Consent given by: patient Patient understanding: patient states understanding of the procedure being  performed Patient identity confirmed: verbally with patient Body area: upper extremity Location details: right thumb Laceration length: 2.5 cm Foreign bodies: no foreign bodies Tendon involvement: none Nerve involvement: none Vascular damage: no Anesthesia: digital block Local anesthetic: bupivacaine 0.5% without epinephrine Anesthetic total: 8 ml Preparation: Patient was prepped and draped in the usual sterile fashion. Irrigation solution: saline Irrigation method: jet lavage Amount of cleaning: standard Debridement: none Degree of undermining: none Skin closure: 4-0 nylon Number of sutures: 5 Technique: simple Approximation: close Approximation difficulty: simple Dressing: antibiotic ointment and 4x4 sterile gauze Patient tolerance: Patient tolerated the procedure well with no immediate complications   (including critical care time)  Labs Review Labs Reviewed - No data to display  Imaging Review No results found.   Visual Acuity Review  Right Eye Distance:   Left Eye Distance:   Bilateral Distance:    Right Eye Near:   Left Eye Near:    Bilateral Near:         MDM   1. Laceration of thumb, right, initial encounter    Suture removal in 10 days. Watch for signs of infection. For any problems return promptly. Written instructions given.    Hayden Rasmussen, NP 09/18/15 2136

## 2015-10-06 ENCOUNTER — Encounter (HOSPITAL_COMMUNITY): Payer: Self-pay | Admitting: Emergency Medicine

## 2015-10-06 ENCOUNTER — Emergency Department (HOSPITAL_COMMUNITY)
Admission: EM | Admit: 2015-10-06 | Discharge: 2015-10-06 | Disposition: A | Discharge status: Home / Self Care | Payer: Self-pay | Attending: Emergency Medicine | Admitting: Emergency Medicine

## 2015-10-06 ENCOUNTER — Emergency Department (HOSPITAL_COMMUNITY)
Admission: EM | Admit: 2015-10-06 | Discharge: 2015-10-06 | Discharge status: Against Medical Advice | Payer: Self-pay | Attending: Emergency Medicine | Admitting: Emergency Medicine

## 2015-10-06 DIAGNOSIS — Z87891 Personal history of nicotine dependence: Secondary | ICD-10-CM | POA: Insufficient documentation

## 2015-10-06 DIAGNOSIS — J4550 Severe persistent asthma, uncomplicated: Secondary | ICD-10-CM | POA: Insufficient documentation

## 2015-10-06 DIAGNOSIS — Z79899 Other long term (current) drug therapy: Secondary | ICD-10-CM | POA: Insufficient documentation

## 2015-10-06 DIAGNOSIS — Z7951 Long term (current) use of inhaled steroids: Secondary | ICD-10-CM | POA: Insufficient documentation

## 2015-10-06 DIAGNOSIS — Z4802 Encounter for removal of sutures: Secondary | ICD-10-CM | POA: Insufficient documentation

## 2015-10-06 NOTE — ED Notes (Signed)
Pt told Steward DroneBrenda, EMT that he had to leave to get son.  Will come back.

## 2015-10-06 NOTE — ED Notes (Signed)
Pt left after suture removal and before discharge instructions.

## 2015-10-06 NOTE — ED Notes (Signed)
Pt states 'I need to leave and pick up my child". Pt is going to leave and come back.

## 2015-10-06 NOTE — ED Notes (Signed)
Pt here for suture removal to right thumb that he had placed 2 weeks ago

## 2015-10-06 NOTE — ED Notes (Signed)
Pt here for suture removal of tip of right thumb.

## 2015-10-06 NOTE — ED Provider Notes (Signed)
CSN: [id number]     Arrival date & time 10/06/15  1136 History  By signing my name below, I, Jarvis Morgan, attest that this documentation has been prepared under the direction and in the presence of Charlestine Night, PA-C Electronically Signed: Jarvis Morgan, ED Scribe. 10/06/2015. 12:56 PM.    No chief complaint on file.    The history is provided by the patient. No language interpreter was used.    HPI Comments: Steven [name] is a 24 y.o. male who presents to the Emergency Department for a suture removal for 5 sutures to the tip of his right thumb, placed 2 weeks ago. He reports the area was cut with a pocket knife. He states his tetanus was updated at that time. He denies any drainage from the area. Pt denies any fever, chills, redness or red streaking.  Past Medical History  Diagnosis Date  . Severe persistent asthma   . Bronchitis   . Allergic rhinitis   . Marijuana abuse   . Hx of tobacco use, presenting hazards to health    Past Surgical History  Procedure Laterality Date  . Tonsillectomy  2008   Family History  Problem Relation Age of Onset  . Asthma Mother   . Cancer Mother   . Asthma Brother    Social History  Substance Use Topics  . Smoking status: Former Smoker    Types: Cigarettes    Quit date: 11/22/2011  . Smokeless tobacco: Not on file  . Alcohol Use: 0.6 oz/week    1 Cans of beer per week     Comment: Occasional    Review of Systems    Allergies  Review of patient's allergies indicates no known allergies.  Home Medications   Prior to Admission medications   Medication Sig Start Date End Date Taking? Authorizing Provider  albuterol (PROVENTIL HFA;VENTOLIN HFA) 108 (90 BASE) MCG/ACT inhaler Inhale 2 puffs into the lungs every 4 (four) hours as needed for wheezing or shortness of breath. 09/17/12   Laveda Norman, MD  fluticasone (FLONASE) 50 MCG/ACT nasal spray Place 1 spray into the nose daily as needed for allergies. 09/17/12   Laveda Norman,  MD  guaiFENesin (MUCINEX) 600 MG 12 hr tablet Take 1 tablet (600 mg total) by mouth 2 (two) times daily. 09/17/12   Laveda Norman, MD  HYDROcodone-acetaminophen (NORCO/VICODIN) 5-325 MG per tablet Take 1 tablet by mouth every 6 (six) hours as needed for pain. 01/24/13   Lisette Paz, PA-C  montelukast (SINGULAIR) 10 MG tablet Take 1 tablet (10 mg total) by mouth at bedtime. 09/17/12   Laveda Norman, MD  moxifloxacin (AVELOX) 400 MG tablet Take 1 tablet (400 mg total) by mouth daily. 09/17/12   Laveda Norman, MD  predniSONE (DELTASONE) 10 MG tablet Take 40 mg daily for 3 days, then 30 mg daily for 3 days, then 20 mg daily for 3 days, then 10 mg daily for 3 days, then stop. 09/17/12   Laveda Norman, MD   There were no vitals taken for this visit. Physical Exam  Constitutional: He is oriented to person, place, and time. He appears well-developed and well-nourished. No distress.  HENT:  Head: Normocephalic and atraumatic.  Eyes: Conjunctivae and EOM are normal.  Neck: Neck supple. No tracheal deviation present.  Cardiovascular: Normal rate.   Pulmonary/Chest: Effort normal. No respiratory distress.  Musculoskeletal: Normal range of motion.  Neurological: He is alert and oriented to person, place, and time.  Skin: Skin is  warm and dry.  Well healing laceration to tip of right thumb  Psychiatric: He has a normal mood and affect. His behavior is normal.  Nursing note and vitals reviewed.   ED Course  Procedures (including critical care time)  DIAGNOSTIC STUDIES:   COORDINATION OF CARE:  SUTURE REMOVAL Performed by: Charlestine Nighthristopher Kaile Bixler, PA-C Consent: Verbal consent obtained. Patient identity confirmed: provided demographic data Time out: Immediately prior to procedure a "time out" was called to verify the correct patient, procedure, equipment, support staff and site/side marked as required. Location: tip of right thumb Wound Appearance: clean Sutures/Staples Removed: 5 Patient tolerance: Patient  tolerated the procedure well with no immediate complications.      I personally performed the services described in this documentation, which was scribed in my presence. The recorded information has been reviewed and is accurate.      Charlestine NightChristopher Damonte Frieson, PA-C 10/06/15 1648  Arby BarretteMarcy Pfeiffer, MD 10/21/15 [phone]

## 2015-12-21 ENCOUNTER — Emergency Department (HOSPITAL_COMMUNITY)
Admission: EM | Admit: 2015-12-21 | Discharge: 2015-12-21 | Disposition: A | Discharge status: Home / Self Care | Payer: Self-pay | Attending: Emergency Medicine | Admitting: Emergency Medicine

## 2015-12-21 ENCOUNTER — Encounter (HOSPITAL_COMMUNITY): Payer: Self-pay | Admitting: *Deleted

## 2015-12-21 DIAGNOSIS — J029 Acute pharyngitis, unspecified: Secondary | ICD-10-CM | POA: Insufficient documentation

## 2015-12-21 DIAGNOSIS — J45909 Unspecified asthma, uncomplicated: Secondary | ICD-10-CM | POA: Insufficient documentation

## 2015-12-21 DIAGNOSIS — H109 Unspecified conjunctivitis: Secondary | ICD-10-CM | POA: Insufficient documentation

## 2015-12-21 LAB — RAPID STREP SCREEN (MED CTR MEBANE ONLY): Streptococcus, Group A Screen (Direct): NEGATIVE

## 2015-12-21 MED ORDER — FLUORESCEIN SODIUM 1 MG OP STRP
1.0000 | ORAL_STRIP | Freq: Once | OPHTHALMIC | Status: DC
  Filled 2015-12-21: qty 1

## 2015-12-21 NOTE — Discharge Instructions (Signed)

## 2015-12-21 NOTE — ED Notes (Signed)
Pt walked out of room advised PA and this nurse he was leaving that he had to go.  PA asked him to wait just a couple minutes while she got his prescriptions and papers together.  Pt turned around and walked out the door to lobby without discharge.

## 2015-12-21 NOTE — ED Provider Notes (Signed)
CSN: [id number]     Arrival date & time 12/21/15  1744 History  By signing my name below, I, Steven [name], attest that this documentation has been prepared under the direction and in the presence of Alveta Heimlich, PA-C Electronically Signed: Soijett [name], ED Scribe. 12/21/2015. 6:28 PM.   Chief Complaint  Patient presents with  . Eye Pain   The history is provided by the patient. No language interpreter was used.   HPI Comments: Steven [name] is a 25 y.o. male with a medical hx of asthma who presents to the Emergency Department complaining of mild left eye pain onset 2 days ago. He describes the pain as an ache. the pain is not exacerbated by eye movement. He states that he is having associated symptoms of left eye discharge, left eye redness x 2 days, blurred vision to left eye, and sore throat. He notes that he was around sick contacts prior to the onset of his symptoms. He denies wearing contacts. His sore throat is a mild irritation. He denies difficulty handling secretions, breathing or swallowing He states that he has not tried any medications for the relief for his symptoms. He denies right eye pain/redness/drainage, and any other symptoms. Denies fevers, chills, headache, dizziness, weakness, ear pain, nasal congestion, rhinorrhea, neck pain, chest pain, cough or SOB.   Past Medical History  Diagnosis Date  . Asthma    History reviewed. No pertinent past surgical history. History reviewed. No pertinent family history. Social History  Substance Use Topics  . Smoking status: Never Smoker   . Smokeless tobacco: Never Used  . Alcohol Use: No    Review of Systems  Constitutional: Negative for fever.  HENT: Positive for sore throat.   Eyes: Positive for pain, discharge, redness and visual disturbance.  Skin: Negative for rash.  All other systems reviewed and are negative.     Allergies  Review of patient's allergies indicates no known allergies.  Home Medications   Prior to  Admission medications   Not on File   BP 132/81 mmHg  Pulse 92  Temp(Src) 97.9 F (36.6 C) (Oral)  Resp 16  Ht  (1.676 m)  Wt 78.019 kg  BMI 27.77 kg/m2  SpO2 100% Physical Exam  Constitutional: He appears well-developed and well-nourished. No distress.  HENT:  Head: Normocephalic and atraumatic.  Mouth/Throat: Oropharynx is clear and moist. No oropharyngeal exudate.  Eyes: EOM and lids are normal. Pupils are equal, round, and reactive to light. Right eye exhibits no discharge. Left eye exhibits exudate. Left eye exhibits no discharge. Right conjunctiva is not injected. Left conjunctiva is injected. No scleral icterus.  Slit lamp exam:      The left eye shows no corneal abrasion, no corneal ulcer and no fluorescein uptake.  Injection of left conjunctiva with small amount of purulent discharge noted. No erythema or the eyelids or periorbital skin. EOM intact without pain. PERRL. No sign of corneal abrasion or ulceration on fluorescein exam.     Visual Acuity  Right Eye Distance: 20/25 without corrective lens Left Eye Distance: 20/30 without corrective lens Bilateral Distance: 20/25 without corrective lens   Neck: Normal range of motion. Neck supple.  Cardiovascular: Normal rate and regular rhythm.   Pulmonary/Chest: Effort normal. No respiratory distress.  Musculoskeletal: Normal range of motion.  Lymphadenopathy:    He has no cervical adenopathy.  Neurological: He is alert. Coordination normal.  Skin: Skin is warm and dry.  Psychiatric: He has a normal mood and affect. His  behavior is normal.  Nursing note and vitals reviewed.   ED Course  Procedures (including critical care time) DIAGNOSTIC STUDIES: Oxygen Saturation is 100% on RA, nl by my interpretation.    COORDINATION OF CARE: 6:24 PM Discussed treatment plan with pt at bedside which includes abx ointment drops and pt agreed to plan.  7:56 PM- Pt came up to the Nurses station and asked Alveta Heimlich, PA when  the results of his rapid strep would come back. After being informed that the results have not been read yet  7:59 PM- Pt was witnessed walking out of the ED without waiting for staff to provide his Rx and discharge paperwork.   Labs Review Labs Reviewed  RAPID STREP SCREEN (NOT AT [facility])    Imaging Review No results found.    EKG Interpretation None      MDM   Final diagnoses:  Bacterial conjunctivitis of left eye   Steven [name] presents with symptoms consistent with bacterial conjunctivitis. Injected conjunctiva and purulent discharge noted on exam.  No corneal abrasions or dendritic staining with fluorescein study. EOM non-painful with no concern for entrapment. No evidence of preseptal or orbital cellulitis. Pt is not a contact lens wearer. Rapid strep was sent to the lab. Patient exited room and asked when the lab would be back. Informed pt that I was unsure an exact time but it was in process and would be soon. Pt then turned and walked towards the lobby. Attempted to get patient to wait for his eye antibiotics but pt continued walking and exited the ED without Rx or discharge paperwork.   I personally performed the services described in this documentation, which was scribed in my presence. The recorded information has been reviewed and is accurate.   Rolm Gala Dallys Nowakowski, PA-C 12/21/15 2037  Eber Hong, MD 12/22/15 [phone]

## 2015-12-21 NOTE — ED Notes (Signed)
Pt reported eye pain started 2 days ago LT

## 2015-12-22 ENCOUNTER — Emergency Department (HOSPITAL_COMMUNITY)
Admission: EM | Admit: 2015-12-22 | Discharge: 2015-12-22 | Disposition: A | Discharge status: Home / Self Care | Payer: Self-pay | Attending: Emergency Medicine | Admitting: Emergency Medicine

## 2015-12-22 ENCOUNTER — Encounter (HOSPITAL_COMMUNITY): Payer: Self-pay

## 2015-12-22 DIAGNOSIS — Z87891 Personal history of nicotine dependence: Secondary | ICD-10-CM | POA: Insufficient documentation

## 2015-12-22 DIAGNOSIS — J029 Acute pharyngitis, unspecified: Secondary | ICD-10-CM | POA: Insufficient documentation

## 2015-12-22 DIAGNOSIS — Z79899 Other long term (current) drug therapy: Secondary | ICD-10-CM | POA: Insufficient documentation

## 2015-12-22 DIAGNOSIS — H109 Unspecified conjunctivitis: Secondary | ICD-10-CM | POA: Insufficient documentation

## 2015-12-22 LAB — RAPID STREP SCREEN (MED CTR MEBANE ONLY): Streptococcus, Group A Screen (Direct): NEGATIVE

## 2015-12-22 MED ORDER — ERYTHROMYCIN 5 MG/GM OP OINT: TOPICAL_OINTMENT | OPHTHALMIC | Status: DC

## 2015-12-22 NOTE — Discharge Instructions (Signed)
Bacterial Conjunctivitis Bacterial conjunctivitis, commonly called pink eye, is an inflammation of the clear membrane that covers the white part of the eye (conjunctiva). The inflammation can also happen on the underside of the eyelids. The blood vessels in the conjunctiva become inflamed, causing the eye to become red or pink. Bacterial conjunctivitis may spread easily from one eye to another and from person to person (contagious).  CAUSES  Bacterial conjunctivitis is caused by bacteria. The bacteria may come from your own skin, your upper respiratory tract, or from someone else with bacterial conjunctivitis. SYMPTOMS  The normally white color of the eye or the underside of the eyelid is usually pink or red. The pink eye is usually associated with irritation, tearing, and some sensitivity to light. Bacterial conjunctivitis is often associated with a thick, yellowish discharge from the eye. The discharge may turn into a crust on the eyelids overnight, which causes your eyelids to stick together. If a discharge is present, there may also be some blurred vision in the affected eye. DIAGNOSIS  Bacterial conjunctivitis is diagnosed by your caregiver through an eye exam and the symptoms that you report. Your caregiver looks for changes in the surface tissues of your eyes, which may point to the specific type of conjunctivitis. A sample of any discharge may be collected on a cotton-tip swab if you have a severe case of conjunctivitis, if your cornea is affected, or if you keep getting repeat infections that do not respond to treatment. The sample will be sent to a lab to see if the inflammation is caused by a bacterial infection and to see if the infection will respond to antibiotic medicines. TREATMENT   Bacterial conjunctivitis is treated with antibiotics. Antibiotic eyedrops are most often used. However, antibiotic ointments are also available. Antibiotics pills are sometimes used. Artificial tears or eye  washes may ease discomfort. HOME CARE INSTRUCTIONS   To ease discomfort, apply a cool, clean washcloth to your eye for 10-20 minutes, 3-4 times a day.  Gently wipe away any drainage from your eye with a warm, wet washcloth or a cotton ball.  Wash your hands often with soap and water. Use paper towels to dry your hands.  Do not share towels or washcloths. This may spread the infection.  Change or wash your pillowcase every day.  You should not use eye makeup until the infection is gone.  Do not operate machinery or drive if your vision is blurred.  Stop using contact lenses. Ask your caregiver how to sterilize or replace your contacts before using them again. This depends on the type of contact lenses that you use.  When applying medicine to the infected eye, do not touch the edge of your eyelid with the eyedrop bottle or ointment tube. SEEK IMMEDIATE MEDICAL CARE IF:   Your infection has not improved within 3 days after beginning treatment.  You had yellow discharge from your eye and it returns.  You have increased eye pain.  Your eye redness is spreading.  Your vision becomes blurred.  You have a fever or persistent symptoms for more than 2-3 days.  You have a fever and your symptoms suddenly get worse.  You have facial pain, redness, or swelling. MAKE SURE YOU:   Understand these instructions.  Will watch your condition.  Will get help right away if you are not doing well or get worse.   This information is not intended to replace advice given to you by your health care provider. Make sure you   discuss any questions you have with your health care provider.   Document Released: 11/07/2005 Document Revised: 11/28/2014 Document Reviewed: 04/09/2012 Elsevier Interactive Patient Education 2016 [employer]  Pharyngitis Pharyngitis is a sore throat (pharynx). There is redness, pain, and swelling of your throat. HOME CARE   Drink enough fluids to keep your pee  (urine) clear or pale yellow.  Only take medicine as told by your doctor.  You may get sick again if you do not take medicine as told. Finish your medicines, even if you start to feel better.  Do not take aspirin.  Rest.  Rinse your mouth (gargle) with salt water ( tsp of salt per 1 qt of water) every 1-2 hours. This will help the pain.  If you are not at risk for choking, you can suck on hard candy or sore throat lozenges. GET HELP IF:  You have large, tender lumps on your neck.  You have a rash.  You cough up green, yellow-brown, or bloody spit. GET HELP RIGHT AWAY IF:   You have a stiff neck.  You drool or cannot swallow liquids.  You throw up (vomit) or are not able to keep medicine or liquids down.  You have very bad pain that does not go away with medicine.  You have problems breathing (not from a stuffy nose). MAKE SURE YOU:   Understand these instructions.  Will watch your condition.  Will get help right away if you are not doing well or get worse.   This information is not intended to replace advice given to you by your health care provider. Make sure you discuss any questions you have with your health care provider.   Document Released: 04/25/2008 Document Revised: 08/28/2013 Document Reviewed: 07/15/2013 Elsevier Interactive Patient Education 2016 [employer].   Please read attached information. If you experience any new or worsening signs or symptoms please return to the emergency room for evaluation. Please follow-up with your primary care provider or specialist as discussed. Please use medication prescribed only as directed and discontinue taking if you have any concerning signs or symptoms.

## 2015-12-22 NOTE — ED Provider Notes (Signed)
CSN: [id number]     Arrival date & time 12/22/15  0620 History   First MD Initiated Contact with Patient 12/22/15 0631     Chief Complaint  Patient presents with  . Conjunctivitis  . Sore Throat   HPI   Steven [name] from male presents today with complaints of red eye and sore throat. Patient reports 3 days ago he developed redness to his left eye that quickly spread to his right eye. He reports watery and colored discharge with matting in the morning. He denies any changes in vision, painful ocular movements. Patient reports around the same time he developed a dry scratchy throat that has continued to persist. He reports pain with swallowing, but denies any difficulty. He denies any drooling, change in voice, fever, neck stiffness, or any other concerning signs or symptoms today. Patient has not tried any over-the-counter medications prior to arrival.    Past Medical History  Diagnosis Date  . Severe persistent asthma   . Bronchitis   . Allergic rhinitis   . Marijuana abuse   . Hx of tobacco use, presenting hazards to health    Past Surgical History  Procedure Laterality Date  . Tonsillectomy  2008   Family History  Problem Relation Age of Onset  . Asthma Mother   . Cancer Mother   . Asthma Brother    Social History  Substance Use Topics  . Smoking status: Former Smoker    Types: Cigarettes    Quit date: 11/22/2011  . Smokeless tobacco: None  . Alcohol Use: No     Comment: Occasional    Review of Systems  All other systems reviewed and are negative.   Allergies  Review of patient's allergies indicates no known allergies.  Home Medications   Prior to Admission medications   Medication Sig Start Date End Date Taking? Authorizing Provider  albuterol (PROVENTIL HFA;VENTOLIN HFA) 108 (90 BASE) MCG/ACT inhaler Inhale 2 puffs into the lungs every 4 (four) hours as needed for wheezing or shortness of breath. Patient not taking: Reported on 12/22/2015 09/17/12   Laveda Norman, MD   erythromycin ophthalmic ointment Place a 1/2 inch ribbon of ointment into the lower eyelid 4 times a day for 5 days 12/22/15   Eyvonne Mechanic, PA-C  fluticasone [facility]) 50 MCG/ACT nasal spray Place 1 spray into the nose daily as needed for allergies. Patient not taking: Reported on 12/22/2015 09/17/12   Laveda Norman, MD  guaiFENesin (MUCINEX) 600 MG 12 hr tablet Take 1 tablet (600 mg total) by mouth 2 (two) times daily. Patient not taking: Reported on 12/22/2015 09/17/12   Laveda Norman, MD  HYDROcodone-acetaminophen (NORCO/VICODIN) 5-325 MG per tablet Take 1 tablet by mouth every 6 (six) hours as needed for pain. Patient not taking: Reported on 12/22/2015 01/24/13   Lisette Paz, PA-C  montelukast (SINGULAIR) 10 MG tablet Take 1 tablet (10 mg total) by mouth at bedtime. Patient not taking: Reported on 12/22/2015 09/17/12   Laveda Norman, MD  moxifloxacin (AVELOX) 400 MG tablet Take 1 tablet (400 mg total) by mouth daily. Patient not taking: Reported on 12/22/2015 09/17/12   Laveda Norman, MD  predniSONE (DELTASONE) 10 MG tablet Take 40 mg daily for 3 days, then 30 mg daily for 3 days, then 20 mg daily for 3 days, then 10 mg daily for 3 days, then stop. Patient not taking: Reported on 12/22/2015 09/17/12   Laveda Norman, MD   BP 133/91 mmHg  Pulse 74  Temp(Src)  97.8 F (36.6 C) (Oral)  Resp 18  Ht  (1.727 m)  Wt 68.04 kg  BMI 22.81 kg/m2  SpO2 97%   Physical Exam  Constitutional: He is oriented to person, place, and time. He appears well-developed and well-nourished.  HENT:  Head: Normocephalic and atraumatic.  Tonsils equal bilateral, no signs of swelling, exudate. Uvula midline and rises with phonation, no signs of posterior pharyngeal abscess, PTA. No vesicles noted  Eyes: Pupils are equal, round, and reactive to light. Right eye exhibits no discharge. Left eye exhibits no discharge. Right conjunctiva is injected. Left conjunctiva is injected. No scleral icterus.  Neck: Normal range of motion.  Neck supple. No JVD present. No tracheal deviation present.  Cardiovascular: Normal rate, regular rhythm and normal heart sounds.  Exam reveals no gallop and no friction rub.   No murmur heard. Pulmonary/Chest: Effort normal and breath sounds normal. No stridor. No respiratory distress. He has no wheezes. He has no rales. He exhibits no tenderness.  Lymphadenopathy:    He has no cervical adenopathy.  Neurological: He is alert and oriented to person, place, and time. Coordination normal.  Skin: Skin is warm and dry. No rash noted. No erythema. No pallor.  Psychiatric: He has a normal mood and affect. His behavior is normal. Judgment and thought content normal.  Nursing note and vitals reviewed.   ED Course  Procedures (including critical care time) Labs Review Labs Reviewed  RAPID STREP SCREEN (NOT AT [facility])  CULTURE, GROUP A STREP [facility])    Imaging Review No results found. I have personally reviewed and evaluated these images and lab results as part of my medical decision-making.   EKG Interpretation None      MDM   Final diagnoses:  Bilateral conjunctivitis  Pharyngitis    Labs: Rapid strep negative  Imaging:  Consults:  Therapeutics:   Discharge Meds: Erythromycin  Assessment/Plan: Presentation is most consistent with viral URI. Patient has bilateral conjunctivitis, and pharyngitis. Patient does note matting with discharge in the morning to his eyes, he will be given erythromycin ophthalmic, and symptomatic care instructions. Patient is given strict return precautions, verbalized understanding and agreement to today's plan.           Eyvonne Mechanic, PA-C 12/22/15 1610  Shon Baton, MD 12/22/15 [phone]

## 2015-12-22 NOTE — ED Notes (Signed)
Pt complains of eye irritation and sore throat x 3 days. Denies being around anyone sick. Eyes appear to be red. Pt denies cough, fever, chills.

## 2015-12-24 LAB — CULTURE, GROUP A STREP (THRC)

## 2016-01-17 ENCOUNTER — Encounter (HOSPITAL_COMMUNITY): Payer: Self-pay | Admitting: *Deleted

## 2016-01-17 ENCOUNTER — Emergency Department (HOSPITAL_COMMUNITY)
Admission: EM | Admit: 2016-01-17 | Discharge: 2016-01-17 | Disposition: A | Discharge status: Home / Self Care | Payer: Self-pay | Attending: Emergency Medicine | Admitting: Emergency Medicine

## 2016-01-17 DIAGNOSIS — H109 Unspecified conjunctivitis: Secondary | ICD-10-CM | POA: Insufficient documentation

## 2016-01-17 DIAGNOSIS — J45909 Unspecified asthma, uncomplicated: Secondary | ICD-10-CM | POA: Insufficient documentation

## 2016-01-17 MED ORDER — CETIRIZINE HCL 10 MG PO TABS: 10.0000 mg | ORAL_TABLET | Freq: Every day | ORAL | Status: DC

## 2016-01-17 MED ORDER — ERYTHROMYCIN 5 MG/GM OP OINT: TOPICAL_OINTMENT | OPHTHALMIC | Status: DC

## 2016-01-17 NOTE — Discharge Instructions (Signed)
Bacterial Conjunctivitis °Bacterial conjunctivitis, commonly called pink eye, is an inflammation of the clear membrane that covers the white part of the eye (conjunctiva). The inflammation can also happen on the underside of the eyelids. The blood vessels in the conjunctiva become inflamed, causing the eye to become red or pink. Bacterial conjunctivitis may spread easily from one eye to another and from person to person (contagious).  °CAUSES  °Bacterial conjunctivitis is caused by bacteria. The bacteria may come from your own skin, your upper respiratory tract, or from someone else with bacterial conjunctivitis. °SYMPTOMS  °The normally white color of the eye or the underside of the eyelid is usually pink or red. The pink eye is usually associated with irritation, tearing, and some sensitivity to light. Bacterial conjunctivitis is often associated with a thick, yellowish discharge from the eye. The discharge may turn into a crust on the eyelids overnight, which causes your eyelids to stick together. If a discharge is present, there may also be some blurred vision in the affected eye. °DIAGNOSIS  °Bacterial conjunctivitis is diagnosed by your caregiver through an eye exam and the symptoms that you report. Your caregiver looks for changes in the surface tissues of your eyes, which may point to the specific type of conjunctivitis. A sample of any discharge may be collected on a cotton-tip swab if you have a severe case of conjunctivitis, if your cornea is affected, or if you keep getting repeat infections that do not respond to treatment. The sample will be sent to a lab to see if the inflammation is caused by a bacterial infection and to see if the infection will respond to antibiotic medicines. °TREATMENT  °1. Bacterial conjunctivitis is treated with antibiotics. Antibiotic eyedrops are most often used. However, antibiotic ointments are also available. Antibiotics pills are sometimes used. Artificial tears or eye  washes may ease discomfort. °HOME CARE INSTRUCTIONS  °1. To ease discomfort, apply a cool, clean washcloth to your eye for 10-20 minutes, 3-4 times a day. °2. Gently wipe away any drainage from your eye with a warm, wet washcloth or a cotton ball. °3. Wash your hands often with soap and water. Use paper towels to dry your hands. °4. Do not share towels or washcloths. This may spread the infection. °5. Change or wash your pillowcase every day. °6. You should not use eye makeup until the infection is gone. °7. Do not operate machinery or drive if your vision is blurred. °8. Stop using contact lenses. Ask your caregiver how to sterilize or replace your contacts before using them again. This depends on the type of contact lenses that you use. °9. When applying medicine to the infected eye, do not touch the edge of your eyelid with the eyedrop bottle or ointment tube. °SEEK IMMEDIATE MEDICAL CARE IF:  °· Your infection has not improved within 3 days after beginning treatment. °· You had yellow discharge from your eye and it returns. °· You have increased eye pain. °· Your eye redness is spreading. °· Your vision becomes blurred. °· You have a fever or persistent symptoms for more than 2-3 days. °· You have a fever and your symptoms suddenly get worse. °· You have facial pain, redness, or swelling. °MAKE SURE YOU:  °· Understand these instructions. °· Will watch your condition. °· Will get help right away if you are not doing well or get worse. °  °This information is not intended to replace advice given to you by your health care provider. Make sure you   discuss any questions you have with your health care provider. °  °Document Released: 11/07/2005 Document Revised: 11/28/2014 Document Reviewed: 04/09/2012 °Elsevier Interactive Patient Education ©2016 [employer] ° °How to Use Eye Drops and Eye Ointments °HOW TO APPLY EYE DROPS °Follow these steps when applying eye drops: °2. Wash your hands. °3. Tilt your head  back. °4. Put a finger under your eye and use it to gently pull your lower lid downward. Keep that finger in place. °5. Using your other hand, hold the dropper between your thumb and index finger. °6. Position the dropper just over the edge of the lower lid. Hold it as close to your eye as you can without touching the dropper to your eye. °7. Steady your hand. One way to do this is to lean your index finger against your brow. °8. Look up. °9. Slowly and gently squeeze one drop of medicine into your eye. °10. Close your eye. °11. Place a finger between your lower eyelid and your nose. Press gently for 2 minutes. This increases the amount of time that the medicine is exposed to the eye. It also reduces side effects that can develop if the drop gets into the bloodstream through the nose. °HOW TO APPLY EYE OINTMENTS °Follow these steps when applying eye ointments: °10. Wash your hands. °11. Put a finger under your eye and use it to gently pull your lower lid downward. Keep that finger in place. °12. Using your other hand, place the tip of the tube between your thumb and index finger with the remaining fingers braced against your cheek or nose. °13. Hold the tube just over the edge of your lower lid without touching the tube to your lid or eyeball. °14. Look up. °15. Line the inner part of your lower lid with ointment. °16. Gently pull up on your upper lid and look down. This will force the ointment to spread over the surface of the eye. °17. Release the upper lid. °18. If you can, close your eyes for 1-2 minutes. °Do not rub your eyes. If you applied the ointment correctly, your vision will be blurry for a few minutes. This is normal. °ADDITIONAL INFORMATION °· Make sure to use the eye drops or ointment as told by your health care provider. °· If you have been told to use both eye drops and an eye ointment, apply the eye drops first, then wait 3-4 minutes before you apply the ointment. °· Try not to touch the tip of the  dropper or tube to your eye. A dropper or tube that has touched the eye can become contaminated. °  °This information is not intended to replace advice given to you by your health care provider. Make sure you discuss any questions you have with your health care provider. °  °Document Released: 02/13/2001 Document Revised: 03/24/2015 Document Reviewed: 11/03/2014 °Elsevier Interactive Patient Education ©2016 [employer] ° °

## 2016-01-17 NOTE — ED Notes (Signed)
Pt states left eye redness, irritation and itching since yesterday.

## 2016-01-17 NOTE — ED Notes (Signed)
Declined W/C at D/C and was escorted to lobby by RN. 

## 2016-01-17 NOTE — ED Provider Notes (Signed)
CSN: [id number]     Arrival date & time 01/17/16  0620 History   First MD Initiated Contact with Patient 01/17/16 [phone]     Chief Complaint  Patient presents with  . Eye Problem     (Consider location/radiation/quality/duration/timing/severity/associated sxs/prior Treatment) Patient is a 25 y.o. male presenting with eye problem. The history is provided by the patient.  Eye Problem Location:  L eye Quality: not painful; itchy. Severity:  Mild Onset quality:  Sudden Duration:  1 day Timing:  Constant Progression:  Worsening Chronicity:  Recurrent (Patient seen 12/21/15 for similar symptoms and presentation) Context: not burn, not chemical exposure, not contact lens problem, not direct trauma, not foreign body, not using machinery and not scratch   Relieved by:  Nothing Worsened by:  Nothing tried Ineffective treatments:  Flushing Associated symptoms: inflammation, itching, redness and tearing   Associated symptoms: no blurred vision, no crusting, no decreased vision, no discharge, no double vision, no facial rash, no foreign body sensation, no headaches, no nausea, no photophobia, no swelling, no vomiting and no weakness   Risk factors: not exposed to pinkeye     Past Medical History  Diagnosis Date  . Asthma    History reviewed. No pertinent past surgical history. No family history on file. Social History  Substance Use Topics  . Smoking status: Never Smoker   . Smokeless tobacco: Never Used  . Alcohol Use: No    Review of Systems  Constitutional: Negative for fever and chills.  HENT: Negative for facial swelling.   Eyes: Positive for redness and itching. Negative for blurred vision, double vision, photophobia and discharge.  Gastrointestinal: Negative for nausea and vomiting.  Neurological: Negative for weakness and headaches.      Allergies  Review of patient's allergies indicates no known allergies.  Home Medications   Prior to Admission medications    Medication Sig Start Date End Date Taking? Authorizing Provider  cetirizine (ZYRTEC) 10 MG tablet Take 1 tablet (10 mg total) by mouth daily. 01/17/16   Cheri Fowler, PA-C  erythromycin ophthalmic ointment Place a 1/2 inch ribbon of ointment into the lower eyelid 4 times a day for 5-7 days. 01/17/16   Christopherjame Carnell, PA-C   BP 159/88 mmHg  Pulse 71  Temp(Src) 97.8 F (36.6 C) (Oral)  Resp 16  SpO2 97% Physical Exam  Constitutional: He is oriented to person, place, and time. He appears well-developed and well-nourished.  HENT:  Head: Normocephalic and atraumatic.  Right Ear: External ear normal.  Left Ear: External ear normal.  Eyes: EOM and lids are normal. Pupils are equal, round, and reactive to light. Lids are everted and swept, no foreign bodies found. Right eye exhibits no chemosis, no discharge, no exudate and no hordeolum. No foreign body present in the right eye. Left eye exhibits no chemosis, no discharge, no exudate and no hordeolum. No foreign body present in the left eye. Right conjunctiva is not injected. Left conjunctiva is injected (mildly). No scleral icterus.  Visual Acuity Right Eye Distance: 20/20 Left Eye Distance: 20/25 Bilateral Distance: 20/20        Neck: No tracheal deviation present.  Pulmonary/Chest: Effort normal. No respiratory distress.  Abdominal: He exhibits no distension.  Musculoskeletal: Normal range of motion.  Neurological: He is alert and oriented to person, place, and time.  Skin: Skin is warm and dry.  Psychiatric: He has a normal mood and affect. His behavior is normal.    ED Course  Procedures (including critical care time)  Labs Review Labs Reviewed - No data to display  Imaging Review No results found. I have personally reviewed and evaluated these images and lab results as part of my medical decision-making.   EKG Interpretation None      MDM   Final diagnoses:  Conjunctivitis of left eye    Patient presents with symptoms  consistent with conjunctivitis. No systemic symptoms. VSS, NAD. On exam, no left periorbital erythema. Mild left lid swelling secondary to itching. Left conjunctiva mildly injected. Maintaining eye exam unremarkable. No concern for foreign body or irritant exposure. Doubt preseptal or orbital cellulitis. Patient does not wear contacts. And to discharge home with erythromycin ophthalmic ointment and Zyrtec for possible allergic component. Follow-up ophthalmology. Discussed return precautions. Patient agrees and acknowledges the above plan for discharge.    Cheri Fowler, PA-C 01/17/16 [id number]  Blane Ohara, MD 01/17/16 1540

## 2016-08-03 ENCOUNTER — Encounter (HOSPITAL_COMMUNITY): Payer: Self-pay

## 2016-08-03 ENCOUNTER — Emergency Department (HOSPITAL_COMMUNITY)
Admission: EM | Admit: 2016-08-03 | Discharge: 2016-08-03 | Disposition: A | Discharge status: Home / Self Care | Payer: Self-pay | Attending: Emergency Medicine | Admitting: Emergency Medicine

## 2016-08-03 DIAGNOSIS — Z79899 Other long term (current) drug therapy: Secondary | ICD-10-CM | POA: Insufficient documentation

## 2016-08-03 DIAGNOSIS — Z87891 Personal history of nicotine dependence: Secondary | ICD-10-CM | POA: Insufficient documentation

## 2016-08-03 DIAGNOSIS — J4521 Mild intermittent asthma with (acute) exacerbation: Secondary | ICD-10-CM | POA: Insufficient documentation

## 2016-08-03 MED ORDER — NAPROXEN 250 MG PO TABS
500.0000 mg | ORAL_TABLET | Freq: Once | ORAL | Status: AC
  Administered 2016-08-03: 500 mg via ORAL
  Filled 2016-08-03: qty 2

## 2016-08-03 MED ORDER — PREDNISONE 10 MG PO TABS: 60.0000 mg | ORAL_TABLET | Freq: Every day | ORAL | 0 refills | Status: DC

## 2016-08-03 MED ORDER — ALBUTEROL SULFATE (2.5 MG/3ML) 0.083% IN NEBU
5.0000 mg | INHALATION_SOLUTION | RESPIRATORY_TRACT | Status: AC
  Administered 2016-08-03 (×3): 5 mg via RESPIRATORY_TRACT
  Filled 2016-08-03 (×2): qty 6

## 2016-08-03 MED ORDER — PREDNISONE 20 MG PO TABS
60.0000 mg | ORAL_TABLET | Freq: Once | ORAL | Status: AC
  Administered 2016-08-03: 60 mg via ORAL
  Filled 2016-08-03: qty 3

## 2016-08-03 MED ORDER — IPRATROPIUM-ALBUTEROL 0.5-2.5 (3) MG/3ML IN SOLN
3.0000 mL | Freq: Once | RESPIRATORY_TRACT | Status: DC
  Administered 2016-08-03: 3 mL via RESPIRATORY_TRACT
  Filled 2016-08-03: qty 3

## 2016-08-03 MED ORDER — ALBUTEROL SULFATE HFA 108 (90 BASE) MCG/ACT IN AERS
2.0000 | INHALATION_SPRAY | Freq: Once | RESPIRATORY_TRACT | Status: AC
  Administered 2016-08-03: 2 via RESPIRATORY_TRACT
  Filled 2016-08-03: qty 6.7

## 2016-08-03 MED ORDER — IPRATROPIUM BROMIDE 0.02 % IN SOLN
0.5000 mg | Freq: Once | RESPIRATORY_TRACT | Status: AC
  Administered 2016-08-03: 0.5 mg via RESPIRATORY_TRACT

## 2016-08-03 NOTE — ED Triage Notes (Signed)
Pt complaining of low back pain x 2 months. Pt also complaining of cough and congestion as well. Pt states recent illness in family. States feels same. Pt states needs new inhaler also.

## 2016-08-03 NOTE — ED Provider Notes (Signed)
[facility]-EMERGENCY DEPT Provider Note   CSN: [id number] Arrival date & time: 08/03/16  0300  By signing my name below, I, Majel Homer, attest that this documentation has been prepared under the direction and in the presence of Derwood Kaplan, MD . Electronically Signed: Majel Homer, Scribe. 08/03/2016. 3:55 AM.  History   Chief Complaint Chief Complaint  Patient presents with  . Cough  . Back Pain   The history is provided by the patient. No language interpreter was used.   HPI Comments: Jeramie Scogin is a 25 y.o. male with PMHx of allergic bronchitis and severe persistent asthma, who presents to the Emergency Department complaining of gradually worsening, productive cough with yellow phlegm that began 2 days ago and worsened today. 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 He states he was coughing so hard last night that it caused pain in his back and woke him up from sleep which is why he visited the ED. Pt notes he has taken tylenol and 800 mg ibuprofen with no relief. He denies fever. He reports hx of smoking.  Past Medical History:  Diagnosis Date  . Allergic rhinitis   . Bronchitis   . Hx of tobacco use, presenting hazards to health   . Marijuana abuse   . Severe persistent asthma     Patient Active Problem List   Diagnosis Date Noted  . Acute bronchitis 09/16/2012  . Status asthmaticus 09/15/2012  . Acute respiratory failure with hypoxia (HCC) 09/15/2012  . Sinus tachycardia (HCC) 09/15/2012  . Severe persistent asthma   . Allergic rhinitis   . Marijuana abuse   . Hx of tobacco use, presenting hazards to health     Past Surgical History:  Procedure Laterality Date  . TONSILLECTOMY  2008     Home Medications    Prior to Admission medications   Medication Sig Start Date End Date Taking? Authorizing Provider  albuterol  (PROVENTIL HFA;VENTOLIN HFA) 108 (90 BASE) MCG/ACT inhaler Inhale 2 puffs into the lungs every 4 (four) hours as needed for wheezing or shortness of breath. 09/17/12  Yes Laveda Norman, MD  erythromycin ophthalmic ointment Place a 1/2 inch ribbon of ointment into the lower eyelid 4 times a day for 5 days Patient not taking: Reported on 08/03/2016 12/22/15   Eyvonne Mechanic, PA-C  fluticasone [facility]) 50 MCG/ACT nasal spray Place 1 spray into the nose daily as needed for allergies. Patient not taking: Reported on 08/03/2016 09/17/12   Laveda Norman, MD  guaiFENesin (MUCINEX) 600 MG 12 hr tablet Take 1 tablet (600 mg total) by mouth 2 (two) times daily. Patient not taking: Reported on 08/03/2016 09/17/12   Laveda Norman, MD  HYDROcodone-acetaminophen (NORCO/VICODIN) 5-325 MG per tablet Take 1 tablet by mouth every 6 (six) hours as needed for pain. Patient not taking: Reported on 08/03/2016 01/24/13   Lisette Paz, PA-C  montelukast (SINGULAIR) 10 MG tablet Take 1 tablet (10 mg total) by mouth at bedtime. Patient not taking: Reported on 08/03/2016 09/17/12   Laveda Norman, MD  moxifloxacin (AVELOX) 400 MG tablet Take 1 tablet (400 mg total) by mouth daily. Patient not taking: Reported on 08/03/2016 09/17/12   Laveda Norman, MD  predniSONE (DELTASONE) 10 MG tablet Take 6 tablets (60 mg total) by mouth daily. 08/03/16   Derwood Kaplan, MD    Family History Family  History  Problem Relation Age of Onset  . Asthma Mother   . Cancer Mother   . Asthma Brother     Social History Social History  Substance Use Topics  . Smoking status: Former Smoker    Types: Cigarettes    Quit date: 11/22/2011  . Smokeless tobacco: Never Used  . Alcohol use No     Comment: Occasional     Allergies   Review of patient's allergies indicates no known allergies.   Review of Systems Review of Systems  Constitutional: Negative for fever.  Respiratory: Positive for cough and wheezing.   Musculoskeletal: Positive for back pain.    Physical Exam Updated Vital Signs BP 141/94   Pulse 78   Temp 98.4 F (36.9 C) (Oral)   Resp 14   Ht 5\' 7"  (1.702 m)   Wt 157 lb (71.2 kg)   SpO2 95%   BMI 24.59 kg/m   Physical Exam  Constitutional: He is oriented to person, place, and time. He appears well-developed and well-nourished.  HENT:  Head: Normocephalic and atraumatic.  Eyes: Conjunctivae are normal. Pupils are equal, round, and reactive to light. Right eye exhibits no discharge. Left eye exhibits no discharge. No scleral icterus.  Neck: Normal range of motion. No JVD present. No tracheal deviation present.  Cardiovascular: Normal rate, regular rhythm and normal heart sounds.   Pulmonary/Chest: No stridor. He has wheezes.  Generalized wheezing inspiratory and expiratory   Musculoskeletal:  Lower lumbar spine tenderness  Neurological: He is alert and oriented to person, place, and time. Coordination normal.  Psychiatric: He has a normal mood and affect. His behavior is normal. Judgment and thought content normal.  Nursing note and vitals reviewed.  ED Treatments / Results  Labs (all labs ordered are listed, but only abnormal results are displayed) Labs Reviewed - No data to display  EKG  EKG Interpretation None       Radiology No results found.  Procedures Procedures (including critical care time)  Medications Ordered in ED Medications  albuterol (PROVENTIL HFA;VENTOLIN HFA) 108 (90 Base) MCG/ACT inhaler 2 puff (not administered)  predniSONE (DELTASONE) tablet 60 mg (not administered)  predniSONE (DELTASONE) tablet 60 mg (60 mg Oral Given 08/03/16 0453)  naproxen (NAPROSYN) tablet 500 mg (500 mg Oral Given 08/03/16 0453)  albuterol (PROVENTIL) (2.5 MG/3ML) 0.083% nebulizer solution 5 mg (5 mg Nebulization Given 08/03/16 0459)  ipratropium (ATROVENT) nebulizer solution 0.5 mg (0.5 mg Nebulization Given 08/03/16 0457)     Initial Impression / Assessment and Plan / ED Course  I have reviewed the  triage vital signs and the nursing notes.  Pertinent labs & imaging results that were available during my care of the patient were reviewed by me and considered in my medical decision making (see chart for details).  Clinical Course  Comment By Time  Repeat exam reveals clearing of wheezing in all lung fields. Patient is not in any respiratory distress nor is there hypoxia.  Derwood Kaplan, MD 09/13 1610  DIAGNOSTIC STUDIES:  Oxygen Saturation is 95% on RA, normal by my interpretation.    COORDINATION OF CARE:  3:53 AM Discussed treatment plan with pt at bedside and pt agreed to plan.   I personally performed the services described in this documentation, which was scribed in my presence. The recorded information has been reviewed and is accurate.   Final Clinical Impressions(s) / ED Diagnoses   Final diagnoses:  Acute asthma exacerbation, mild intermittent   Pt comes in with cc of  wheezing and back pain. 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 It has been present for several days, no specific trauma. No indication for imaging as there are no red flags suggesting infection, pathologic or lytic lesions or cord compression.  Pt will be started on albuterol for his likely asthma exacerbation. With diffuse wheezing, unlikely to be PNA.     New Prescriptions New Prescriptions   PREDNISONE (DELTASONE) 10 MG TABLET    Take 6 tablets (60 mg total) by mouth daily.     Derwood KaplanAnkit Patrycja Mumpower, MD 08/03/16 [phone]

## 2016-08-03 NOTE — Discharge Instructions (Signed)
We saw you in the ER for your asthma related complains. We gave you some breathing treatments in the ER, and seems like your symptoms have improved. Please take albuterol as needed every 4 hours. Please take the medications prescribed. Please refrain from smoking or smoke exposure. Please see a primary care doctor in 1 week. Return to the ER if your symptoms worsen.  

## 2016-12-01 ENCOUNTER — Emergency Department (HOSPITAL_COMMUNITY): Payer: Self-pay

## 2016-12-01 ENCOUNTER — Emergency Department (HOSPITAL_COMMUNITY)
Admission: EM | Admit: 2016-12-01 | Discharge: 2016-12-01 | Disposition: A | Discharge status: Home / Self Care | Payer: Self-pay | Attending: Emergency Medicine | Admitting: Emergency Medicine

## 2016-12-01 ENCOUNTER — Encounter (HOSPITAL_COMMUNITY): Payer: Self-pay

## 2016-12-01 ENCOUNTER — Inpatient Hospital Stay (HOSPITAL_COMMUNITY)
Admission: EM | Admit: 2016-12-01 | Discharge: 2016-12-05 | DRG: 202 | Discharge status: Against Medical Advice | Payer: Self-pay | Attending: Internal Medicine | Admitting: Internal Medicine

## 2016-12-01 DIAGNOSIS — Z825 Family history of asthma and other chronic lower respiratory diseases: Secondary | ICD-10-CM

## 2016-12-01 DIAGNOSIS — B9789 Other viral agents as the cause of diseases classified elsewhere: Secondary | ICD-10-CM | POA: Diagnosis present

## 2016-12-01 DIAGNOSIS — R112 Nausea with vomiting, unspecified: Secondary | ICD-10-CM | POA: Diagnosis present

## 2016-12-01 DIAGNOSIS — J9600 Acute respiratory failure, unspecified whether with hypoxia or hypercapnia: Secondary | ICD-10-CM

## 2016-12-01 DIAGNOSIS — B9710 Unspecified enterovirus as the cause of diseases classified elsewhere: Secondary | ICD-10-CM | POA: Diagnosis present

## 2016-12-01 DIAGNOSIS — F1210 Cannabis abuse, uncomplicated: Secondary | ICD-10-CM | POA: Diagnosis present

## 2016-12-01 DIAGNOSIS — J4550 Severe persistent asthma, uncomplicated: Secondary | ICD-10-CM | POA: Diagnosis present

## 2016-12-01 DIAGNOSIS — R062 Wheezing: Secondary | ICD-10-CM | POA: Insufficient documentation

## 2016-12-01 DIAGNOSIS — F419 Anxiety disorder, unspecified: Secondary | ICD-10-CM | POA: Diagnosis present

## 2016-12-01 DIAGNOSIS — R000 Tachycardia, unspecified: Secondary | ICD-10-CM | POA: Diagnosis present

## 2016-12-01 DIAGNOSIS — Z87891 Personal history of nicotine dependence: Secondary | ICD-10-CM | POA: Insufficient documentation

## 2016-12-01 DIAGNOSIS — Z5321 Procedure and treatment not carried out due to patient leaving prior to being seen by health care provider: Secondary | ICD-10-CM | POA: Insufficient documentation

## 2016-12-01 DIAGNOSIS — J45901 Unspecified asthma with (acute) exacerbation: Secondary | ICD-10-CM

## 2016-12-01 DIAGNOSIS — J4551 Severe persistent asthma with (acute) exacerbation: Secondary | ICD-10-CM

## 2016-12-01 DIAGNOSIS — E876 Hypokalemia: Secondary | ICD-10-CM | POA: Diagnosis present

## 2016-12-01 DIAGNOSIS — J9601 Acute respiratory failure with hypoxia: Secondary | ICD-10-CM | POA: Diagnosis present

## 2016-12-01 DIAGNOSIS — R0602 Shortness of breath: Secondary | ICD-10-CM | POA: Insufficient documentation

## 2016-12-01 DIAGNOSIS — R05 Cough: Secondary | ICD-10-CM | POA: Insufficient documentation

## 2016-12-01 DIAGNOSIS — J4552 Severe persistent asthma with status asthmaticus: Principal | ICD-10-CM | POA: Diagnosis present

## 2016-12-01 DIAGNOSIS — J45902 Unspecified asthma with status asthmaticus: Secondary | ICD-10-CM | POA: Diagnosis present

## 2016-12-01 DIAGNOSIS — R809 Proteinuria, unspecified: Secondary | ICD-10-CM | POA: Diagnosis present

## 2016-12-01 DIAGNOSIS — J209 Acute bronchitis, unspecified: Secondary | ICD-10-CM | POA: Diagnosis present

## 2016-12-01 LAB — BASIC METABOLIC PANEL
Anion gap: 13 (ref 5–15)
BUN: 10 mg/dL (ref 6–20)
CALCIUM: 9.7 mg/dL (ref 8.9–10.3)
CO2: 22 mmol/L (ref 22–32)
CREATININE: 1 mg/dL (ref 0.61–1.24)
Chloride: 106 mmol/L (ref 101–111)
GFR calc non Af Amer: >60 mL/min (ref >60)
Glucose, Bld: 110 mg/dL — ABNORMAL HIGH (ref 65–99)
Potassium: 3.2 mmol/L — ABNORMAL LOW (ref 3.5–5.1)
SODIUM: 141 mmol/L (ref 135–145)

## 2016-12-01 LAB — I-STAT TROPONIN, ED: TROPONIN I, POC: 0 ng/mL (ref 0.00–0.08)

## 2016-12-01 LAB — I-STAT CG4 LACTIC ACID, ED: Lactic Acid, Venous: 3.39 mmol/L (ref 0.5–1.9)

## 2016-12-01 LAB — LIPASE, BLOOD: LIPASE: 14 U/L (ref 11–51)

## 2016-12-01 LAB — CBC
HCT: 48.1 % (ref 39.0–52.0)
Hemoglobin: 16.9 g/dL (ref 13.0–17.0)
MCH: 28.9 pg (ref 26.0–34.0)
MCHC: 35.1 g/dL (ref 30.0–36.0)
MCV: 82.4 fL (ref 78.0–100.0)
PLATELETS: 227 10*3/uL (ref 150–400)
RBC: 5.84 MIL/uL — AB (ref 4.22–5.81)
RDW: 12.4 % (ref 11.5–15.5)
WBC: 7.2 10*3/uL (ref 4.0–10.5)

## 2016-12-01 MED ORDER — ONDANSETRON 4 MG PO TBDP
4.0000 mg | ORAL_TABLET | Freq: Once | ORAL | Status: AC | PRN
  Administered 2016-12-01: 4 mg via ORAL

## 2016-12-01 MED ORDER — ONDANSETRON HCL 4 MG/2ML IJ SOLN
4.0000 mg | Freq: Once | INTRAMUSCULAR | Status: AC
  Administered 2016-12-01: 4 mg via INTRAVENOUS
  Filled 2016-12-01: qty 2

## 2016-12-01 MED ORDER — ALBUTEROL SULFATE (2.5 MG/3ML) 0.083% IN NEBU
5.0000 mg | INHALATION_SOLUTION | Freq: Once | RESPIRATORY_TRACT | Status: AC
  Administered 2016-12-01: 5 mg via RESPIRATORY_TRACT

## 2016-12-01 MED ORDER — IPRATROPIUM BROMIDE 0.02 % IN SOLN
1.0000 mg | Freq: Once | RESPIRATORY_TRACT | Status: AC
  Administered 2016-12-01: 1 mg via RESPIRATORY_TRACT
  Filled 2016-12-01: qty 5

## 2016-12-01 MED ORDER — ALBUTEROL (5 MG/ML) CONTINUOUS INHALATION SOLN
15.0000 mg/h | INHALATION_SOLUTION | RESPIRATORY_TRACT | Status: DC
  Administered 2016-12-01: 15 mg/h via RESPIRATORY_TRACT
  Filled 2016-12-01: qty 20

## 2016-12-01 MED ORDER — MAGNESIUM SULFATE 2 GM/50ML IV SOLN
2.0000 g | Freq: Once | INTRAVENOUS | Status: AC
  Administered 2016-12-01: 2 g via INTRAVENOUS
  Filled 2016-12-01: qty 50

## 2016-12-01 MED ORDER — ONDANSETRON 4 MG PO TBDP
ORAL_TABLET | ORAL | Status: AC
  Filled 2016-12-01: qty 1

## 2016-12-01 MED ORDER — KETOROLAC TROMETHAMINE 30 MG/ML IJ SOLN
15.0000 mg | Freq: Once | INTRAMUSCULAR | Status: AC
  Administered 2016-12-01: 15 mg via INTRAVENOUS
  Filled 2016-12-01: qty 1

## 2016-12-01 MED ORDER — ALBUTEROL SULFATE (2.5 MG/3ML) 0.083% IN NEBU
5.0000 mg | INHALATION_SOLUTION | Freq: Once | RESPIRATORY_TRACT | Status: AC
  Administered 2016-12-01: 5 mg via RESPIRATORY_TRACT
  Filled 2016-12-01: qty 6

## 2016-12-01 MED ORDER — IPRATROPIUM-ALBUTEROL 0.5-2.5 (3) MG/3ML IN SOLN: 3.0000 mL | RESPIRATORY_TRACT | Status: DC

## 2016-12-01 MED ORDER — METHYLPREDNISOLONE SODIUM SUCC 125 MG IJ SOLR
125.0000 mg | Freq: Once | INTRAMUSCULAR | Status: AC
  Administered 2016-12-01: 125 mg via INTRAVENOUS
  Filled 2016-12-01: qty 2

## 2016-12-01 MED ORDER — ACETAMINOPHEN 325 MG PO TABS
650.0000 mg | ORAL_TABLET | Freq: Once | ORAL | Status: AC
  Administered 2016-12-01: 650 mg via ORAL
  Filled 2016-12-01: qty 2

## 2016-12-01 MED ORDER — SODIUM CHLORIDE 0.9 % IV BOLUS (SEPSIS)
2000.0000 mL | Freq: Once | INTRAVENOUS | Status: AC
  Administered 2016-12-01: 2000 mL via INTRAVENOUS

## 2016-12-01 MED ORDER — ALBUTEROL SULFATE (2.5 MG/3ML) 0.083% IN NEBU
INHALATION_SOLUTION | RESPIRATORY_TRACT | Status: AC
  Filled 2016-12-01: qty 6

## 2016-12-01 NOTE — ED Triage Notes (Signed)
Patient c/o expiratory wheezing and SOB x 2 days. Patient has a productive cough with clear sputum.

## 2016-12-01 NOTE — ED Notes (Signed)
Dr. Eudelia Bunchardama aware of pt's lactic acid.  Will move patient to next available room.

## 2016-12-01 NOTE — ED Notes (Signed)
EKG hand delivered to Dr. Mesner 

## 2016-12-01 NOTE — ED Provider Notes (Addendum)
[facility]-EMERGENCY DEPT Provider Note   CSN: [id number] Arrival date & time: 12/01/16  2125     History   Chief Complaint Chief Complaint  Patient presents with  . Shortness of Breath  . Emesis    HPI Steven [name] is a 26 y.o. male.   Shortness of Breath  This is a recurrent problem. The average episode lasts 1 day. The problem occurs continuously.The problem has been gradually worsening. Associated symptoms include sore throat, cough, sputum production, wheezing and chest pain. Pertinent negatives include no fever. He has tried beta-agonist inhalers for the symptoms. The treatment provided mild relief. He has had no prior hospitalizations. He has had prior ED visits. He has had no prior ICU admissions. Associated medical issues include asthma.    Past Medical History:  Diagnosis Date  . Allergic rhinitis   . Bronchitis   . Hx of tobacco use, presenting hazards to health   . Marijuana abuse   . Severe persistent asthma     Patient Active Problem List   Diagnosis Date Noted  . Acute bronchitis 09/16/2012  . Status asthmaticus 09/15/2012  . Acute respiratory failure with hypoxia (HCC) 09/15/2012  . Sinus tachycardia 09/15/2012  . Severe persistent asthma   . Allergic rhinitis   . Marijuana abuse   . Hx of tobacco use, presenting hazards to health     Past Surgical History:  Procedure Laterality Date  . TONSILLECTOMY  2008       Home Medications    Prior to Admission medications   Medication Sig Start Date End Date Taking? Authorizing Provider  albuterol (PROVENTIL HFA;VENTOLIN HFA) 108 (90 BASE) MCG/ACT inhaler Inhale 2 puffs into the lungs every 4 (four) hours as needed for wheezing or shortness of breath. 09/17/12  Yes Laveda Normanhris N Oti, MD  ibuprofen (ADVIL,MOTRIN) 200 MG tablet Take 800 mg by mouth every 6 (six) hours as needed (for chest tightness).   Yes Historical Provider, MD  erythromycin ophthalmic ointment Place a 1/2 inch ribbon of ointment into the lower  eyelid 4 times a day for 5 days Patient not taking: Reported on 12/01/2016 12/22/15   Eyvonne MechanicJeffrey Hedges, PA-C  fluticasone (FLONASE) 50 MCG/ACT nasal spray Place 1 spray into the nose daily as needed for allergies. Patient not taking: Reported on 12/01/2016 09/17/12   Laveda Normanhris N Oti, MD  guaiFENesin (MUCINEX) 600 MG 12 hr tablet Take 1 tablet (600 mg total) by mouth 2 (two) times daily. Patient not taking: Reported on 12/01/2016 09/17/12   Laveda Normanhris N Oti, MD  HYDROcodone-acetaminophen (NORCO/VICODIN) 5-325 MG per tablet Take 1 tablet by mouth every 6 (six) hours as needed for pain. Patient not taking: Reported on 12/01/2016 01/24/13   Lisette Paz, PA-C  montelukast (SINGULAIR) 10 MG tablet Take 1 tablet (10 mg total) by mouth at bedtime. Patient not taking: Reported on 12/01/2016 09/17/12   Laveda Normanhris N Oti, MD  moxifloxacin (AVELOX) 400 MG tablet Take 1 tablet (400 mg total) by mouth daily. Patient not taking: Reported on 12/01/2016 09/17/12   Laveda Normanhris N Oti, MD  predniSONE (DELTASONE) 10 MG tablet Take 6 tablets (60 mg total) by mouth daily. Patient not taking: Reported on 12/01/2016 08/03/16   Derwood KaplanAnkit Nanavati, MD    Family History Family History  Problem Relation Age of Onset  . Asthma Mother   . Cancer Mother   . Asthma Brother     Social History Social History  Substance Use Topics  . Smoking status: Former Smoker    Types: Cigarettes  Quit date: 11/22/2011  . Smokeless tobacco: Never Used  . Alcohol use No     Comment: Occasional     Allergies   Patient has no known allergies.   Review of Systems Review of Systems  Constitutional: Negative for fever.  HENT: Positive for sore throat.   Respiratory: Positive for cough, sputum production, shortness of breath and wheezing.   Cardiovascular: Positive for chest pain.  All other systems reviewed and are negative.    Physical Exam Updated Vital Signs BP 146/80 (BP Location: Left Arm)   Pulse 120   Temp 97.9 F (36.6 C) (Oral)   Resp 21    SpO2 94%   Physical Exam  Constitutional: He is oriented to person, place, and time. He appears well-developed and well-nourished. He appears distressed.  HENT:  Head: Normocephalic and atraumatic.  Eyes: Conjunctivae and EOM are normal.  Neck: Normal range of motion.  Cardiovascular: Tachycardia present.   Pulmonary/Chest: Accessory muscle usage present. Tachypnea noted. He is in respiratory distress. He has wheezes.  Abdominal: He exhibits no distension.  Musculoskeletal: Normal range of motion.  Neurological: He is alert and oriented to person, place, and time. He displays normal reflexes. No cranial nerve deficit. Coordination normal.  Skin: Skin is warm and dry.  Nursing note and vitals reviewed.    ED Treatments / Results  Labs (all labs ordered are listed, but only abnormal results are displayed) Labs Reviewed  BASIC METABOLIC PANEL - Abnormal; Notable for the following:       Result Value   Potassium 3.2 (*)    Glucose, Bld 110 (*)    All other components within normal limits  CBC - Abnormal; Notable for the following:    RBC 5.84 (*)    All other components within normal limits  I-STAT CG4 LACTIC ACID, ED - Abnormal; Notable for the following:    Lactic Acid, Venous 3.39 (*)    All other components within normal limits  CULTURE, BLOOD (ROUTINE X 2)  CULTURE, BLOOD (ROUTINE X 2)  LIPASE, BLOOD  URINALYSIS, ROUTINE W REFLEX MICROSCOPIC  INFLUENZA PANEL BY PCR (TYPE A & B, H1N1)  I-STAT TROPOININ, ED    EKG  EKG Interpretation  Date/Time:  Thursday December 01 2016 21:48:06 EST Ventricular Rate:  98 PR Interval:  166 QRS Duration: 102 QT Interval:  352 QTC Calculation: 449 R Axis:   92 Text Interpretation:  Normal sinus rhythm Rightward axis Septal infarct , age undetermined Abnormal ECG similar to previous  Confirmed by [facility] MD, Barbara Cower [phone]) on 12/01/2016 9:50:12 PM       Radiology Dg Chest 2 View  Result Date: 12/01/2016 CLINICAL DATA:  Acute onset  of shortness of breath and cough. Generalized chest pain. Initial encounter. EXAM: CHEST  2 VIEW COMPARISON:  Chest radiograph performed 09/15/2012 FINDINGS: The lungs are well-aerated. Pulmonary vascularity is at the upper limits of normal. There is no evidence of focal opacification, pleural effusion or pneumothorax. The heart is normal in size; the mediastinal contour is within normal limits. No acute osseous abnormalities are seen. IMPRESSION: No acute cardiopulmonary process seen. Electronically Signed   By: Roanna Raider M.D.   On: 12/01/2016 18:14    Procedures Procedures (including critical care time)  CRITICAL CARE Performed by: Marily Memos Total critical care time: 35 minutes Critical care time was exclusive of separately billable procedures and treating other patients. Critical care was necessary to treat or prevent imminent or life-threatening deterioration. Critical care was time spent personally  by me on the following activities: development of treatment plan with patient and/or surrogate as well as nursing, discussions with consultants, evaluation of patient's response to treatment, examination of patient, obtaining history from patient or surrogate, ordering and performing treatments and interventions, ordering and review of laboratory studies, ordering and review of radiographic studies, pulse oximetry and re-evaluation of patient's condition.   Medications Ordered in ED Medications  ondansetron (ZOFRAN-ODT) 4 MG disintegrating tablet (not administered)  sodium chloride 0.9 % bolus 2,000 mL (not administered)  acetaminophen (TYLENOL) tablet 650 mg (not administered)  ondansetron (ZOFRAN) injection 4 mg (not administered)  albuterol (PROVENTIL,VENTOLIN) solution continuous neb (15 mg/hr Nebulization New Bag/Given 12/01/16 2234)  magnesium sulfate IVPB 2 g 50 mL (not administered)  methylPREDNISolone sodium succinate (SOLU-MEDROL) 125 mg/2 mL injection 125 mg (not administered)   ketorolac (TORADOL) 30 MG/ML injection 15 mg (not administered)  ondansetron (ZOFRAN-ODT) disintegrating tablet 4 mg (4 mg Oral Given 12/01/16 2139)  albuterol (PROVENTIL) (2.5 MG/3ML) 0.083% nebulizer solution 5 mg (5 mg Nebulization Given 12/01/16 2145)  ipratropium (ATROVENT) nebulizer solution 1 mg (1 mg Nebulization Given 12/01/16 2234)     Initial Impression / Assessment and Plan / ED Course  I have reviewed the triage vital signs and the nursing notes.  Pertinent labs & imaging results that were available during my care of the patient were reviewed by me and considered in my medical decision making (see chart for details).  Clinical Course    Moderate severity asthma exacerbation. Will give albuterol/steroids/Mg/bolus/toradol and will need reassessment for disposition.    Final Clinical Impressions(s) / ED Diagnoses   Final diagnoses:  Moderate asthma with exacerbation, unspecified whether persistent    New Prescriptions New Prescriptions   No medications on file     Marily Memos, MD 12/01/16 4403    Marily Memos, MD 12/01/16 2336

## 2016-12-01 NOTE — ED Notes (Addendum)
Pt diaphoretic and vomiting. MD aware.

## 2016-12-01 NOTE — ED Triage Notes (Addendum)
Pt reports SOB and productive cough that started yesterday.Steven [name]. He is stating 'I cant breathe" He reports this feels like an asthma attack. He is talking in complete sentences, emesis x 1 during triage. [facility]. Pt diaphoretic during triage

## 2016-12-01 NOTE — ED Notes (Signed)
Pt took off wristband and walked out of ED.

## 2016-12-02 ENCOUNTER — Encounter (HOSPITAL_COMMUNITY): Payer: Self-pay

## 2016-12-02 DIAGNOSIS — J9601 Acute respiratory failure with hypoxia: Secondary | ICD-10-CM

## 2016-12-02 DIAGNOSIS — J4552 Severe persistent asthma with status asthmaticus: Principal | ICD-10-CM

## 2016-12-02 LAB — BASIC METABOLIC PANEL
ANION GAP: 11 (ref 5–15)
BUN: 11 mg/dL (ref 6–20)
CALCIUM: 9.6 mg/dL (ref 8.9–10.3)
CO2: 24 mmol/L (ref 22–32)
CREATININE: 1.03 mg/dL (ref 0.61–1.24)
Chloride: 104 mmol/L (ref 101–111)
Glucose, Bld: 152 mg/dL — ABNORMAL HIGH (ref 65–99)
Potassium: 3.8 mmol/L (ref 3.5–5.1)
SODIUM: 139 mmol/L (ref 135–145)

## 2016-12-02 LAB — URINALYSIS, ROUTINE W REFLEX MICROSCOPIC
BILIRUBIN URINE: NEGATIVE
Bacteria, UA: NONE SEEN
GLUCOSE, UA: 50 mg/dL — AB
HGB URINE DIPSTICK: NEGATIVE
KETONES UR: 5 mg/dL — AB
Leukocytes, UA: NEGATIVE
NITRITE: NEGATIVE
PROTEIN: 30 mg/dL — AB
Specific Gravity, Urine: 1.028 (ref 1.005–1.030)
pH: 5 (ref 5.0–8.0)

## 2016-12-02 LAB — RESPIRATORY PANEL BY PCR
ADENOVIRUS-RVPPCR: NOT DETECTED
Bordetella pertussis: NOT DETECTED
CORONAVIRUS HKU1-RVPPCR: NOT DETECTED
CORONAVIRUS NL63-RVPPCR: NOT DETECTED
CORONAVIRUS OC43-RVPPCR: NOT DETECTED
Chlamydophila pneumoniae: NOT DETECTED
Coronavirus 229E: NOT DETECTED
INFLUENZA B-RVPPCR: NOT DETECTED
Influenza A: NOT DETECTED
MYCOPLASMA PNEUMONIAE-RVPPCR: NOT DETECTED
Metapneumovirus: NOT DETECTED
PARAINFLUENZA VIRUS 1-RVPPCR: NOT DETECTED
Parainfluenza Virus 2: NOT DETECTED
Parainfluenza Virus 3: NOT DETECTED
Parainfluenza Virus 4: NOT DETECTED
Respiratory Syncytial Virus: NOT DETECTED
Rhinovirus / Enterovirus: DETECTED — AB

## 2016-12-02 LAB — LACTIC ACID, PLASMA: Lactic Acid, Venous: 1.8 mmol/L (ref 0.5–1.9)

## 2016-12-02 LAB — INFLUENZA PANEL BY PCR (TYPE A & B)
Influenza A By PCR: NEGATIVE
Influenza B By PCR: NEGATIVE

## 2016-12-02 LAB — I-STAT ARTERIAL BLOOD GAS, ED
Acid-base deficit: 4 mmol/L — ABNORMAL HIGH (ref 0.0–2.0)
BICARBONATE: 22.3 mmol/L (ref 20.0–28.0)
O2 Saturation: 85 %
PO2 ART: 53 mmHg — AB (ref 83.0–108.0)
Patient temperature: 97.9
TCO2: 24 mmol/L (ref 0–100)
pCO2 arterial: 43.2 mmHg (ref 32.0–48.0)
pH, Arterial: 7.319 — ABNORMAL LOW (ref 7.350–7.450)

## 2016-12-02 LAB — I-STAT CG4 LACTIC ACID, ED: Lactic Acid, Venous: 3.19 mmol/L (ref 0.5–1.9)

## 2016-12-02 MED ORDER — ALBUTEROL SULFATE (2.5 MG/3ML) 0.083% IN NEBU: 10.0000 mg | INHALATION_SOLUTION | Freq: Once | RESPIRATORY_TRACT | Status: DC

## 2016-12-02 MED ORDER — AZITHROMYCIN 250 MG PO TABS
250.0000 mg | ORAL_TABLET | Freq: Every day | ORAL | Status: DC
  Administered 2016-12-03 – 2016-12-05 (×3): 250 mg via ORAL
  Filled 2016-12-02 (×3): qty 1

## 2016-12-02 MED ORDER — MONTELUKAST SODIUM 10 MG PO TABS
10.0000 mg | ORAL_TABLET | Freq: Every day | ORAL | Status: DC
  Administered 2016-12-02 – 2016-12-04 (×3): 10 mg via ORAL
  Filled 2016-12-02 (×3): qty 1

## 2016-12-02 MED ORDER — METHYLPREDNISOLONE SODIUM SUCC 125 MG IJ SOLR: 60.0000 mg | Freq: Two times a day (BID) | INTRAMUSCULAR | Status: DC

## 2016-12-02 MED ORDER — IPRATROPIUM-ALBUTEROL 0.5-2.5 (3) MG/3ML IN SOLN
3.0000 mL | Freq: Four times a day (QID) | RESPIRATORY_TRACT | Status: DC
  Administered 2016-12-02 – 2016-12-03 (×7): 3 mL via RESPIRATORY_TRACT
  Filled 2016-12-02 (×7): qty 3

## 2016-12-02 MED ORDER — SODIUM CHLORIDE 0.9 % IV BOLUS (SEPSIS)
1000.0000 mL | Freq: Once | INTRAVENOUS | Status: AC
  Administered 2016-12-02: 1000 mL via INTRAVENOUS

## 2016-12-02 MED ORDER — PREDNISONE 20 MG PO TABS: 40.0000 mg | ORAL_TABLET | Freq: Every day | ORAL | Status: DC

## 2016-12-02 MED ORDER — ALBUTEROL SULFATE (2.5 MG/3ML) 0.083% IN NEBU: 3.0000 mL | INHALATION_SOLUTION | RESPIRATORY_TRACT | Status: DC | PRN

## 2016-12-02 MED ORDER — MUSCLE RUB 10-15 % EX CREA
TOPICAL_CREAM | CUTANEOUS | Status: DC | PRN
  Administered 2016-12-02: 06:00:00 via TOPICAL
  Filled 2016-12-02: qty 85

## 2016-12-02 MED ORDER — POTASSIUM CHLORIDE CRYS ER 20 MEQ PO TBCR
30.0000 meq | EXTENDED_RELEASE_TABLET | Freq: Once | ORAL | Status: AC
  Administered 2016-12-02: 30 meq via ORAL
  Filled 2016-12-02: qty 1

## 2016-12-02 MED ORDER — SALINE SPRAY 0.65 % NA SOLN
1.0000 | NASAL | Status: DC | PRN
  Administered 2016-12-02: 1 via NASAL
  Filled 2016-12-02: qty 44

## 2016-12-02 MED ORDER — GUAIFENESIN 100 MG/5ML PO SOLN
5.0000 mL | ORAL | Status: DC | PRN
  Administered 2016-12-02: 100 mg via ORAL
  Filled 2016-12-02: qty 5

## 2016-12-02 MED ORDER — ENOXAPARIN SODIUM 40 MG/0.4ML ~~LOC~~ SOLN
40.0000 mg | SUBCUTANEOUS | Status: DC
  Administered 2016-12-02 – 2016-12-04 (×3): 40 mg via SUBCUTANEOUS
  Filled 2016-12-02 (×3): qty 0.4

## 2016-12-02 MED ORDER — ALBUTEROL SULFATE (2.5 MG/3ML) 0.083% IN NEBU: 2.5000 mg | INHALATION_SOLUTION | RESPIRATORY_TRACT | Status: DC

## 2016-12-02 MED ORDER — ONDANSETRON 4 MG PO TBDP
4.0000 mg | ORAL_TABLET | Freq: Three times a day (TID) | ORAL | Status: DC | PRN
  Filled 2016-12-02: qty 1

## 2016-12-02 MED ORDER — OXYCODONE-ACETAMINOPHEN 5-325 MG PO TABS
2.0000 | ORAL_TABLET | ORAL | Status: DC | PRN
  Administered 2016-12-02 – 2016-12-05 (×9): 2 via ORAL
  Filled 2016-12-02 (×9): qty 2

## 2016-12-02 MED ORDER — ALBUTEROL SULFATE (2.5 MG/3ML) 0.083% IN NEBU: 2.5000 mg | INHALATION_SOLUTION | RESPIRATORY_TRACT | Status: DC | PRN

## 2016-12-02 MED ORDER — AZITHROMYCIN 250 MG PO TABS
500.0000 mg | ORAL_TABLET | Freq: Every day | ORAL | Status: AC
Start: 2016-12-02 — End: 2016-12-02
  Administered 2016-12-02: 500 mg via ORAL
  Filled 2016-12-02: qty 2

## 2016-12-02 MED ORDER — ALBUTEROL SULFATE (2.5 MG/3ML) 0.083% IN NEBU
2.5000 mg | INHALATION_SOLUTION | RESPIRATORY_TRACT | Status: DC | PRN
Start: 2016-12-02 — End: 2016-12-02

## 2016-12-02 MED ORDER — METHYLPREDNISOLONE SODIUM SUCC 125 MG IJ SOLR
60.0000 mg | Freq: Four times a day (QID) | INTRAMUSCULAR | Status: DC
  Administered 2016-12-02 – 2016-12-04 (×8): 60 mg via INTRAVENOUS
  Filled 2016-12-02 (×8): qty 2

## 2016-12-02 MED ORDER — PANTOPRAZOLE SODIUM 40 MG IV SOLR
40.0000 mg | INTRAVENOUS | Status: DC
  Administered 2016-12-02 – 2016-12-03 (×2): 40 mg via INTRAVENOUS
  Filled 2016-12-02 (×2): qty 40

## 2016-12-02 MED ORDER — ALPRAZOLAM 0.25 MG PO TABS: 0.2500 mg | ORAL_TABLET | Freq: Three times a day (TID) | ORAL | Status: DC | PRN

## 2016-12-02 MED ORDER — ACETAMINOPHEN 325 MG PO TABS
650.0000 mg | ORAL_TABLET | Freq: Four times a day (QID) | ORAL | Status: DC | PRN
  Administered 2016-12-02: 650 mg via ORAL
  Filled 2016-12-02: qty 2

## 2016-12-02 MED ORDER — MOMETASONE FURO-FORMOTEROL FUM 200-5 MCG/ACT IN AERO
2.0000 | INHALATION_SPRAY | Freq: Two times a day (BID) | RESPIRATORY_TRACT | Status: DC
  Administered 2016-12-02 – 2016-12-05 (×6): 2 via RESPIRATORY_TRACT
  Filled 2016-12-02: qty 8.8

## 2016-12-02 NOTE — ED Notes (Signed)
Attempted report x1. 

## 2016-12-02 NOTE — Plan of Care (Signed)
Problem: Pain Managment: Goal: General experience of comfort will improve Outcome: Progressing Discussed plan of care for the night and management of head ache pain with teach back displayed

## 2016-12-02 NOTE — H&P (Signed)
History and Physical    Steven FasterMelvin Kretchmer ZOX:[id number] DOB: Jun 09, 1991 DOA: 12/01/2016   PCP: Default, Provider, MD Chief Complaint:  Chief Complaint  Patient presents with  . Shortness of Breath  . Emesis    HPI: Steven [name] is a 26 y.o. male with medical history significant of severe persistent asthma.  Patient presents to the ED with respiratory distress.  Patient is clearly having status asthmaticus on arrival.  Symptoms onset 2 days ago.  Worsening and becoming severe immediately PTA.  [facility] after the wait was too long.  Symptoms are severe, nothing makes them better.  ED Course: Hypoxic and requiring NRB to maintain sats in the 90s.  Continuous neb treatment, mag, solumedrol, all given with some improvement.  Review of Systems: As per HPI otherwise 10 point review of systems negative.    Past Medical History:  Diagnosis Date  . Allergic rhinitis   . Bronchitis   . Hx of tobacco use, presenting hazards to health   . Marijuana abuse   . Severe persistent asthma     Past Surgical History:  Procedure Laterality Date  . TONSILLECTOMY  2008     reports that he quit smoking about 5 years ago. His smoking use included Cigarettes. He has never used smokeless tobacco. He reports that he uses drugs, including Marijuana. He reports that he does not drink alcohol.  No Known Allergies  Family History  Problem Relation Age of Onset  . Asthma Mother   . Cancer Mother   . Asthma Brother       Prior to Admission medications   Medication Sig Start Date End Date Taking? Authorizing Provider  albuterol (PROVENTIL HFA;VENTOLIN HFA) 108 (90 BASE) MCG/ACT inhaler Inhale 2 puffs into the lungs every 4 (four) hours as needed for wheezing or shortness of breath. 09/17/12  Yes Laveda Normanhris N Oti, MD  ibuprofen (ADVIL,MOTRIN) 200 MG tablet Take 800 mg by mouth every 6 (six) hours as needed (for chest tightness).   Yes Historical Provider, MD  montelukast  (SINGULAIR) 10 MG tablet Take 1 tablet (10 mg total) by mouth at bedtime. Patient not taking: Reported on 12/01/2016 09/17/12   Laveda Normanhris N Oti, MD    Physical Exam: Vitals:   12/02/16 0012 12/02/16 0045 12/02/16 0100 12/02/16 0130  BP: 127/57 115/65 98/64   Pulse: 118 112 113   Resp: 22 26 25    Temp:      TempSrc:      SpO2: 95% (!) 88% (!) 87% 91%      Constitutional: NAD, calm, comfortable Eyes: PERRL, lids and conjunctivae normal ENMT: Mucous membranes are moist. Posterior pharynx clear of any exudate or lesions.Normal dentition.  Neck: normal, supple, no masses, no thyromegaly Respiratory: clear to auscultation bilaterally, no wheezing, no crackles. Normal respiratory effort. No accessory muscle use.  Cardiovascular: Regular rate and rhythm, no murmurs / rubs / gallops. No extremity edema. 2+ pedal pulses. No carotid bruits.  Abdomen: no tenderness, no masses palpated. No hepatosplenomegaly. Bowel sounds positive.  Musculoskeletal: no clubbing / cyanosis. No joint deformity upper and lower extremities. Good ROM, no contractures. Normal muscle tone.  Skin: no rashes, lesions, ulcers. No induration Neurologic: CN 2-12 grossly intact. Sensation intact, DTR normal. Strength 5/5 in all 4.  Psychiatric: Normal judgment and insight. Alert and oriented x 3. Normal mood.    Labs on Admission: I have personally reviewed following labs and imaging studies  CBC:  Recent Labs Lab 12/01/16 2142  WBC 7.2  HGB 16.9  HCT 48.1  MCV 82.4  PLT 227   Basic Metabolic Panel:  Recent Labs Lab 12/01/16 2142  NA 141  K 3.2*  CL 106  CO2 22  GLUCOSE 110*  BUN 10  CREATININE 1.00  CALCIUM 9.7   GFR: CrCl cannot be calculated (Unknown ideal weight.). Liver Function Tests: No results for input(s): AST, ALT, ALKPHOS, BILITOT, PROT, ALBUMIN in the last 168 hours.  Recent Labs Lab 12/01/16 2142  LIPASE 14   No results for input(s): AMMONIA in the last 168 hours. Coagulation  Profile: No results for input(s): INR, PROTIME in the last 168 hours. Cardiac Enzymes: No results for input(s): CKTOTAL, CKMB, CKMBINDEX, TROPONINI in the last 168 hours. BNP (last 3 results) No results for input(s): PROBNP in the last 8760 hours. HbA1C: No results for input(s): HGBA1C in the last 72 hours. CBG: No results for input(s): GLUCAP in the last 168 hours. Lipid Profile: No results for input(s): CHOL, HDL, LDLCALC, TRIG, CHOLHDL, LDLDIRECT in the last 72 hours. Thyroid Function Tests: No results for input(s): TSH, T4TOTAL, FREET4, T3FREE, THYROIDAB in the last 72 hours. Anemia Panel: No results for input(s): VITAMINB12, FOLATE, FERRITIN, TIBC, IRON, RETICCTPCT in the last 72 hours. Urine analysis:    Component Value Date/Time   COLORURINE AMBER (A) 10/18/2011 2247   APPEARANCEUR CLEAR 10/18/2011 2247   LABSPEC 1.038 (H) 10/18/2011 2247   PHURINE 6.0 10/18/2011 2247   GLUCOSEU NEGATIVE 10/18/2011 2247   HGBUR NEGATIVE 10/18/2011 2247   BILIRUBINUR SMALL (A) 10/18/2011 2247   KETONESUR 15 (A) 10/18/2011 2247   PROTEINUR 30 (A) 10/18/2011 2247   UROBILINOGEN 1.0 10/18/2011 2247   NITRITE NEGATIVE 10/18/2011 2247   LEUKOCYTESUR NEGATIVE 10/18/2011 2247   Sepsis Labs: @LABRCNTIP (procalcitonin:4,lacticidven:4) )No results found for this or any previous visit (from the past 240 hour(s)).   Radiological Exams on Admission: Dg Chest 2 View  Result Date: 12/01/2016 CLINICAL DATA:  Acute onset of shortness of breath and cough. Generalized chest pain. Initial encounter. EXAM: CHEST  2 VIEW COMPARISON:  Chest radiograph performed 09/15/2012 FINDINGS: The lungs are well-aerated. Pulmonary vascularity is at the upper limits of normal. There is no evidence of focal opacification, pleural effusion or pneumothorax. The heart is normal in size; the mediastinal contour is within normal limits. No acute osseous abnormalities are seen. IMPRESSION: No acute cardiopulmonary process seen.  Electronically Signed   By: Roanna Raider M.D.   On: 12/01/2016 18:14    EKG: Independently reviewed.  Assessment/Plan Principal Problem:   Status asthmaticus Active Problems:   Severe persistent asthma   Acute respiratory failure with hypoxia (HCC)    1. Status asthmaticus with hypoxia and new O2 requirement - 1. Solumedrol 2. Nebs 3. Got mag in ED 4. Will try PRN BIPAP 5. Continuous pulse ox and tele monitor 6. Patient going to SDU   DVT prophylaxis: Lovenox Code Status: Full Family Communication: Family at bedside Consults called: None Admission status: Admit to inpatient   Hillary Bow DO [facility] Pager [phone] from 7PM-7AM  If 7AM-7PM, please contact the day physician for the patient [email or web address] Password [email or web address]  12/02/2016, 2:17 AM

## 2016-12-02 NOTE — Progress Notes (Signed)
Pt had been on bipap for a short period of time but could not tolerate after about 45 minutes so placed back on 5L nasal cannula oxygen. Currently not in distress and tolerating nasal cannula, sats >90%. Critical Care NP Chrissie NoaWilliam Minor notified and he ordered prn xanax and percocet see MAR and to notify him if develops distress. Hospitalist MD Hongalgi made aware. Will continue to monitor.

## 2016-12-02 NOTE — Progress Notes (Signed)
Patient stable on 2L nasal cannula without distress. BiPAP not indicated at this time, at bedside if needed. RT will continue to monitor.

## 2016-12-02 NOTE — ED Provider Notes (Signed)
12:54 AM Patient continues to wheeze at this time.  Patient is on 5 L nasal cannula with O2 sats of 90%.  He'll need to be admitted for additional workup.  ABG now.  Continuous 9, Solu-Medrol, magnesium given by Dr. Clayborne DanaMesner.  Will admit to stepdown unit   Azalia BilisKevin Muadh Creasy, MD 12/02/16 [phone]

## 2016-12-02 NOTE — Progress Notes (Signed)
PROGRESS NOTE  Steven [name]  UJW:[id number] DOB: 23-Aug-1991  DOA: 12/01/2016 PCP: Default, Provider, MD   Brief Narrative:  26 year old male with PMH of childhood asthma/severe persistent asthma, tobacco and marijuana abuse, presented to ED with 2 day history of productive cough, worsening dyspnea and wheezing. He was hypoxic in the ED and required nonrebreather mask to maintain saturation in the 90s. Admitted to stepdown unit. Despite steroids, bronchodilators and oxygen, continues to have significant dyspnea, wheezing and active accessory respiratory muscles. PCCM consulted for assistance with management of status asthmaticus.   Assessment & Plan:   Principal Problem:   Status asthmaticus Active Problems:   Severe persistent asthma   Acute respiratory failure with hypoxia (HCC)   1. Acute asthma exacerbation (status asthmaticus) complicating poorly controlled severe persistent asthma: Indicates that he uses a "red inhaler" regularly and "blue inhaler" as needed multiple times a day. His symptoms are provoked by chimney smoke. He has had difficulty seeing physicians or getting medications due to lack of insurance. Admitted to stepdown. RSV panel: Pending. Influenza A & B PCR: Neg, blood cultures 2: Pending. ABG on 12/02/16 at 1:18 AM: PH 7.32, PCO2 43, PCO2 53 and oxygen saturation 85%. Continue oxygen, scheduled and when necessary BD's, increase IV Solu-Medrol to 60 mg every 6 hourly, add Z-Pak for presumed acute bronchitis. CCM consultation appreciated. Trial of BiPAP but if does not improve or worsens, will need transfer to ICU and intubation. This was discussed in detail with patient and he verbalizes understanding.  2. Acute respiratory failure with hypoxia: Management as per problem #1.  3. Tobacco abuse: Cessation counseled  4. Marijuana abuse: Cessation counseled 5. Sinus tachycardia, mild: Related to problem #1 and BD's. Continue to monitor on telemetry. 6. Hypokalemia:  Replaced. 7. Elevated lactate: Likely secondary to problem #1 and 2. Management as above.   DVT prophylaxis: Lovenox Code Status: Full Family Communication: Discussed in detail with patient. No family at bedside.  Disposition Plan: DC home when medically stable.    Consultants:   PCCM  Procedures:   BiPAP  Antimicrobials:   Z-Pak 1/12>    Subjective: Seen this morning. States that dyspnea may be slightly better but still has significant dyspnea, chest tightness, wheezing, productive cough but no chest pain. No fever or chills.   Objective:  Vitals:   12/02/16 0517 12/02/16 0815 12/02/16 0832 12/02/16 0925  BP:  (!) 153/82    Pulse: (!) 112 (!) 120    Resp: (!) 26 (!) 30    Temp: 98.3 F (36.8 C) 98 F (36.7 C)    TempSrc: Oral Oral    SpO2: 97% 90% 90% 93%    Intake/Output Summary (Last 24 hours) at 12/02/16 1008 Last data filed at 12/02/16 0917  Gross per 24 hour  Intake             2050 ml  Output                0 ml  Net             2050 ml   There were no vitals filed for this visit.  Examination:  General exam: Moderately built and nourished pleasant young male sitting up in bed with mild to moderate respiratory distress and intermittent hacking cough.  Respiratory system: reduced and harsh breath sounds bilaterally with scattered bilateral medium pitched expiratory rhonchi. Mild to moderate respiratory distress. Able to speak in short sentences. Cardiovascular system: S1 & S2 heard, regular and tachycardic. No JVD,  murmurs, rubs, gallops or clicks. No pedal edema. telemetry: Sinus tachycardia in the 110s-120s.  Gastrointestinal system: Abdomen is nondistended, soft and nontender. No organomegaly or masses felt. Normal bowel sounds heard. Central nervous system: Alert and oriented. No focal neurological deficits. Extremities: Symmetric 5 x 5 power. Skin: No rashes, lesions or ulcers. Multiple tattoos.  Psychiatry: Judgement and insight appear normal.  Mood & affect appropriate.     Data Reviewed: I have personally reviewed following labs and imaging studies  CBC:  Recent Labs Lab 12/01/16 2142  WBC 7.2  HGB 16.9  HCT 48.1  MCV 82.4  PLT 227   Basic Metabolic Panel:  Recent Labs Lab 12/01/16 2142 12/02/16 0601  NA 141 139  K 3.2* 3.8  CL 106 104  CO2 22 24  GLUCOSE 110* 152*  BUN 10 11  CREATININE 1.00 1.03  CALCIUM 9.7 9.6   GFR: CrCl cannot be calculated (Unknown ideal weight.). Liver Function Tests: No results for input(s): AST, ALT, ALKPHOS, BILITOT, PROT, ALBUMIN in the last 168 hours.  Recent Labs Lab 12/01/16 2142  LIPASE 14   No results for input(s): AMMONIA in the last 168 hours. Coagulation Profile: No results for input(s): INR, PROTIME in the last 168 hours. Cardiac Enzymes: No results for input(s): CKTOTAL, CKMB, CKMBINDEX, TROPONINI in the last 168 hours. BNP (last 3 results) No results for input(s): PROBNP in the last 8760 hours. HbA1C: No results for input(s): HGBA1C in the last 72 hours. CBG: No results for input(s): GLUCAP in the last 168 hours. Lipid Profile: No results for input(s): CHOL, HDL, LDLCALC, TRIG, CHOLHDL, LDLDIRECT in the last 72 hours. Thyroid Function Tests: No results for input(s): TSH, T4TOTAL, FREET4, T3FREE, THYROIDAB in the last 72 hours. Anemia Panel: No results for input(s): VITAMINB12, FOLATE, FERRITIN, TIBC, IRON, RETICCTPCT in the last 72 hours.  Sepsis Labs:  Recent Labs Lab 12/01/16 2142 12/01/16 2153 12/02/16 0045  WBC 7.2  --   --   LATICACIDVEN  --  3.39* 3.19*    No results found for this or any previous visit (from the past 240 hour(s)).       Radiology Studies: Dg Chest 2 View  Result Date: 12/01/2016 CLINICAL DATA:  Acute onset of shortness of breath and cough. Generalized chest pain. Initial encounter. EXAM: CHEST  2 VIEW COMPARISON:  Chest radiograph performed 09/15/2012 FINDINGS: The lungs are well-aerated. Pulmonary vascularity is  at the upper limits of normal. There is no evidence of focal opacification, pleural effusion or pneumothorax. The heart is normal in size; the mediastinal contour is within normal limits. No acute osseous abnormalities are seen. IMPRESSION: No acute cardiopulmonary process seen. Electronically Signed   By: Roanna Raider M.D.   On: 12/01/2016 18:14        Scheduled Meds: . [START ON 12/03/2016] azithromycin  250 mg Oral Daily  . enoxaparin (LOVENOX) injection  40 mg Subcutaneous Q24H  . ipratropium-albuterol  3 mL Nebulization Q6H  . methylPREDNISolone (SOLU-MEDROL) injection  60 mg Intravenous Q6H  . mometasone-formoterol  2 puff Inhalation BID  . montelukast  10 mg Oral QHS  . pantoprazole (PROTONIX) IV  40 mg Intravenous Q24H   Continuous Infusions:   LOS: 0 days   [facility], MD [facility] Pager [phone]  If 7PM-7AM, please contact night-coverage [email or web address] Password [email or web address] 12/02/2016, 10:08 AM

## 2016-12-02 NOTE — Consult Note (Signed)
PULMONARY / CRITICAL CARE MEDICINE   Name: Steven [name] MRN: [id number] DOB: Sep 16, 1991    ADMISSION DATE:  12/01/2016 CONSULTATION DATE:  1/12  REFERRING MD:  [facility]  CHIEF COMPLAINT:  SOB  HISTORY OF PRESENT ILLNESS:   26 yo male, smoker, marijuana user, life long asthmatic who lost medicaid coverage at age 25 and is not followed by pulmonary. He presents with 2 days of increasing resp distress, wheezing, yellow sputum after exposure to chimney fumes. He had proved refractory on interventions and PCCM was called to evaluate. He is in moderated distress but not acutely ill. We will treat with steroids, BD,s, PPI, and NIMVS and if he fails we will move to ICU.  PAST MEDICAL HISTORY :  He  has a past medical history of Allergic rhinitis; Bronchitis; tobacco use, presenting hazards to health; Marijuana abuse; and Severe persistent asthma.  PAST SURGICAL HISTORY: He  has a past surgical history that includes Tonsillectomy (2008).  No Known Allergies  No current facility-administered medications on file prior to encounter.    Current Outpatient Prescriptions on File Prior to Encounter  Medication Sig  . albuterol (PROVENTIL HFA;VENTOLIN HFA) 108 (90 BASE) MCG/ACT inhaler Inhale 2 puffs into the lungs every 4 (four) hours as needed for wheezing or shortness of breath.  . montelukast (SINGULAIR) 10 MG tablet Take 1 tablet (10 mg total) by mouth at bedtime. (Patient not taking: Reported on 12/01/2016)    FAMILY HISTORY:  His indicated that his mother is deceased. He indicated that the status of his brother is unknown.    SOCIAL HISTORY: He  reports that he quit smoking about 5 years ago. His smoking use included Cigarettes. He has never used smokeless tobacco. He reports that he uses drugs, including Marijuana. He reports that he does not drink alcohol.  REVIEW OF SYSTEMS:   10 point review of system taken, please see HPI for positives and negatives.   SUBJECTIVE:  Increased  wob  VITAL SIGNS: BP (!) 153/82 (BP Location: Left Arm)   Pulse (!) 120   Temp 98 F (36.7 C) (Oral)   Resp (!) 30   SpO2 90%   HEMODYNAMICS:    VENTILATOR SETTINGS: FiO2 (%):  [55 %] 55 %  INTAKE / OUTPUT: I/O last 3 completed shifts: In: 2050 [IV Piggyback:2050] Out: -   PHYSICAL EXAMINATION: General:  Anxious male in tripod position Neuro:  Anxious but intact HEENT:  No JVD/LAN Cardiovascular:  HSR RRR Lungs:  MIld exp wheeze Abdomen:  Soft + bs Musculoskeletal: Intact Skin:  Warm and dry  LABS:  BMET  Recent Labs Lab 12/01/16 2142 12/02/16 0601  NA 141 139  K 3.2* 3.8  CL 106 104  CO2 22 24  BUN 10 11  CREATININE 1.00 1.03  GLUCOSE 110* 152*    Electrolytes  Recent Labs Lab 12/01/16 2142 12/02/16 0601  CALCIUM 9.7 9.6    CBC  Recent Labs Lab 12/01/16 2142  WBC 7.2  HGB 16.9  HCT 48.1  PLT 227    Coag's No results for input(s): APTT, INR in the last 168 hours.  Sepsis Markers  Recent Labs Lab 12/01/16 2153 12/02/16 0045  LATICACIDVEN 3.39* 3.19*    ABG  Recent Labs Lab 12/02/16 0118  PHART 7.319*  PCO2ART 43.2  PO2ART 53.0*    Liver Enzymes No results for input(s): AST, ALT, ALKPHOS, BILITOT, ALBUMIN in the last 168 hours.  Cardiac Enzymes No results for input(s): TROPONINI, PROBNP in the last 168 hours.  Glucose No results for input(s): GLUCAP in the last 168 hours.  Imaging Dg Chest 2 View  Result Date: 12/01/2016 CLINICAL DATA:  Acute onset of shortness of breath and cough. Generalized chest pain. Initial encounter. EXAM: CHEST  2 VIEW COMPARISON:  Chest radiograph performed 09/15/2012 FINDINGS: The lungs are well-aerated. Pulmonary vascularity is at the upper limits of normal. There is no evidence of focal opacification, pleural effusion or pneumothorax. The heart is normal in size; the mediastinal contour is within normal limits. No acute osseous abnormalities are seen. IMPRESSION: No acute cardiopulmonary  process seen. Electronically Signed   By: Roanna RaiderJeffery  Chang M.D.   On: 12/01/2016 18:14     STUDIES:    CULTURES:  ANTIBIOTICS: 1/11 zithromax>>  SIGNIFICANT EVENTS:   LINES/TUBES:   DISCUSSION: 26 yo male, smoker, marijuana user, life long asthmatic who lost medicaid coverage at age 26 and is not followed by pulmonary. He presents with 2 days of increasing resp distress, wheezing, yellow sputum after exposure to chimney fumes. He had proved refractory on interventions and PCCM was called to evaluate. He is in moderated distress but not acutely ill. We will treat with steroids, BD,s, PPI, and NIMVS and if he fails we will move to ICU.  ASSESSMENT / PLAN:  PULMONARY A: Acute on chronic asthma with exacerbation (provoked by chimney smoke) Child hood asthma poorly controled due to lack of finances. Tobacco abuse Marijuana use. P:   O2 as needed Steroids BD as needed NIMVS Anxiolytics If he fails treatment then will need ICU/Intubation.  CARDIOVASCULAR A:  Mild tachycardia P:  Follow HR  RENAL A:   No acute issue P:     GASTROINTESTINAL A:   NO acute issue P:   PPI in case of reflux  HEMATOLOGIC  A:   No acute issues P:  Follow labs  INFECTIOUS A:   Bronchitis P:   Zithro  ENDOCRINE A:   No acute issues P:   Follow glucose on steroids  NEUROLOGIC A:   Awake and alert with mild to moderate anxiety P:   RASS goal: 0 Mild sedation if needed   FAMILY  - Updates: Pt updated at bedside  - Inter-disciplinary family meet or Palliative Care meeting due by:  day   CCT 45 min Brett CanalesSteve Minor ACNP [facility]ack[facility] PCCM Pager [phone] till 3 pm If no answer page [phone] 12/02/2016, 9:37 AM   Attending Note:  I have examined patient, reviewed labs, studies and notes. I have discussed the case with S Minor, and I agree with the data and plans as amended above. 26 yo man with asthma since childhood, not currently on therapy or with follow up. He has been  getting by borrowing SABA from others. He was at baseline until 2-3 days PTA when he was exposed to smoke and fumes from chimney. He developed persistent wheeze and dyspnea, was admitted with acute exacerbation asthma. On my eval this am he was awake, uncomfortable sitting upright on side of bed. He had accessory muscle use but was not tachypneic. He had diffuse bilateral exp wheezes. We will continue his solumedrol, scheduled and prn BD's. Will start BiPAp prn to ease WOB. If he decompensates in any way will move him to ICU. Agree with singulair, azithro.   Independent critical care time is 40 minutes.   Levy Pupaobert Daivion Pape, MD, PhD 12/02/2016, 4:09 PM [facility] [phone] or if no answer [phone]

## 2016-12-02 NOTE — Progress Notes (Signed)
NURSING PROGRESS NOTE  Steven [name] [id number] Admission Data: 12/02/2016 8:20 AM Attending Provider: Elease EtienneAnand D Hongalgi, MD YQM:VHQIONGPCP:Default, Provider, MD Code Status: Full   Steven [name] is a 26 y.o. male patient admitted from ED:  Respiratory acute distress noted.  Shortness of breath.  Productive cough  Cardiac Monitoring: Bedside monitor in place. Cardiac monitor yields:normal sinus rhythm.  Blood pressure (!) 153/82, pulse (!) 120, temperature 98 F (36.7 C), temperature source Oral, resp. rate (!) 30, SpO2 90 %.   IV Fluids:  IV in place, occlusive dsg intact without redness, IV cath forearm right, condition patent and no redness none.   Allergies:  Patient has no known allergies.  Past Medical History:   has a past medical history of Allergic rhinitis; Bronchitis; tobacco use, presenting hazards to health; Marijuana abuse; and Severe persistent asthma.  Past Surgical History:   has a past surgical history that includes Tonsillectomy (2008).  Social History:   reports that he quit smoking about 5 years ago. His smoking use included Cigarettes. He has never used smokeless tobacco. He reports that he uses drugs, including Marijuana. He reports that he does not drink alcohol.  Skin: Intact with tattoos covering his torso  Patient/Family orientated to room. Information packet given to patient/family. Admission inpatient armband information verified with patient/family to include name and date of birth and placed on patient arm. Side rails up x 2, fall assessment and education completed with patient/family. Patient/family able to verbalize understanding of risk associated with falls and verbalized understanding to call for assistance before getting out of bed. Call light within reach. Patient/family able to voice and demonstrate understanding of unit orientation instructions.    Will continue to evaluate and treat per MD orders.

## 2016-12-02 NOTE — Progress Notes (Signed)
RT note:  Second CAT completed.  Patient alert and conversive in 4-5 word sentences.  Mild accessory muscle use present.  Diffuse wheezing still auscultated bilaterally.  SpO2 96% on 4 L [place].  While patient is still in some amount of distress, Bipap is not needed at this time.  Discussed with RN.  RT will continue to monitor.

## 2016-12-02 NOTE — Progress Notes (Signed)
RN called d/t CCM at bedside and wants pt on bipap.  No resp distress noted, but pt c/o SOB.  RN at bedside and aware.  Pt initially said "I can't do this", I coached pt to try to give bipap some time.  Pt agreed. Pt placed back on bipap and now tol well.  RN at bedside during this- aware.

## 2016-12-03 DIAGNOSIS — R112 Nausea with vomiting, unspecified: Secondary | ICD-10-CM

## 2016-12-03 LAB — COMPREHENSIVE METABOLIC PANEL
ALBUMIN: 4.3 g/dL (ref 3.5–5.0)
ALK PHOS: 47 U/L (ref 38–126)
ALT: 51 U/L (ref 17–63)
AST: 74 U/L — AB (ref 15–41)
Anion gap: 11 (ref 5–15)
BILIRUBIN TOTAL: 2.1 mg/dL — AB (ref 0.3–1.2)
BUN: 17 mg/dL (ref 6–20)
CALCIUM: 9.7 mg/dL (ref 8.9–10.3)
CO2: 25 mmol/L (ref 22–32)
CREATININE: 0.95 mg/dL (ref 0.61–1.24)
Chloride: 100 mmol/L — ABNORMAL LOW (ref 101–111)
GFR calc Af Amer: >60 mL/min (ref >60)
GFR calc non Af Amer: >60 mL/min (ref >60)
GLUCOSE: 155 mg/dL — AB (ref 65–99)
Potassium: 4.2 mmol/L (ref 3.5–5.1)
SODIUM: 136 mmol/L (ref 135–145)
TOTAL PROTEIN: 7.3 g/dL (ref 6.5–8.1)

## 2016-12-03 LAB — LACTIC ACID, PLASMA: Lactic Acid, Venous: 1.3 mmol/L (ref 0.5–1.9)

## 2016-12-03 MED ORDER — IPRATROPIUM-ALBUTEROL 0.5-2.5 (3) MG/3ML IN SOLN
3.0000 mL | Freq: Three times a day (TID) | RESPIRATORY_TRACT | Status: DC
  Administered 2016-12-04 – 2016-12-05 (×4): 3 mL via RESPIRATORY_TRACT
  Filled 2016-12-03 (×4): qty 3

## 2016-12-03 NOTE — Progress Notes (Signed)
PROGRESS NOTE  Steven [name]  RUE:[id number] DOB: 1991-01-11  DOA: 12/01/2016 PCP: Default, Provider, MD   Brief Narrative:  26 year old male with PMH of childhood asthma/severe persistent asthma, tobacco and marijuana abuse, presented to ED with 2 day history of productive cough, worsening dyspnea and wheezing. He was hypoxic in the ED and required nonrebreather mask to maintain saturation in the 90s. Admitted to stepdown unit. Despite steroids, bronchodilators and oxygen, continues to have significant dyspnea, wheezing and active accessory respiratory muscles. PCCM consulted for assistance with management of status asthmaticus. Treated briefly with BiPAP which he did not tolerate. Anxiety issues. Back on nasal cannula. Clinically improved.   Assessment & Plan:   Principal Problem:   Status asthmaticus Active Problems:   Severe persistent asthma   Acute respiratory failure with hypoxia (HCC)   Nausea with vomiting   1. Acute asthma exacerbation (status asthmaticus) precipitated by acute viral URI (rhinovirus/enterovirus) complicating poorly controlled severe persistent asthma: Indicates that he uses a "red inhaler" regularly and "blue inhaler" as needed multiple times a day. His symptoms were provoked by chimney smoke. He has had difficulty seeing physicians or getting medications due to lack of insurance. Admitted to stepdown. RSV panel: Positive for rhinovirus/enterovirus. Influenza A & B PCR: Neg, blood cultures 2: Negative to date. ABG on 12/02/16 at 1:18 AM: PH 7.32, PCO2 43, PCO2 53 and oxygen saturation 85%. Continue oxygen, scheduled and when necessary BD's, increased IV Solu-Medrol to 60 mg every 6 hourly, add Z-Pak for presumed acute bronchitis. CCM consultation and follow-up appreciated. Clinically improved. Downgraded from stepdown to telemetry. Discussed with pulmonology and RN.  2. Acute respiratory failure with hypoxia: Management as per problem #1.  3. Tobacco abuse: Cessation  counseled  4. Marijuana abuse: Cessation counseled 5. Sinus tachycardia, mild: Related to problem #1 and BD's. Resolved. 6. Hypokalemia: Replaced. 7. Elevated lactate: Likely secondary to problem #1 and 2. Management as above. Resolved.   DVT prophylaxis: Lovenox Code Status: Full Family Communication: Discussed in detail with patient. No family at bedside.  Disposition Plan: DC home when medically stable, possibly in 48-72 hours.    Consultants:   PCCM  Procedures:   BiPAP  Antimicrobials:   Z-Pak 1/12>    Subjective: Seen early this morning. Feels better. Dyspnea improved. He did not complain to me about chest pain, nausea or vomiting. As per RN, no acute issues reported.  Objective:  Vitals:   12/03/16 0600 12/03/16 0752 12/03/16 0802 12/03/16 0804  BP:  (!) 112/59    Pulse: 82 86    Resp: 18 19    Temp:      TempSrc:      SpO2: 95% 92% 94% 98%  Weight:      Height:        Intake/Output Summary (Last 24 hours) at 12/03/16 1208 Last data filed at 12/03/16 0600  Gross per 24 hour  Intake             1320 ml  Output             1950 ml  Net             -630 ml   Filed Weights   12/02/16 1828  Weight: 71.3 kg (157 lb 3.2 oz)    Examination:  General exam: Moderately built and nourished pleasant young male Lying comfortably supine in bed. Looks much improved compared to 1/12 Respiratory system: Improved breath sounds. Few bilateral medium pitched expiratory rhonchi. No increased work of breathing. Able  to speak in full sentences. Cardiovascular system: S1 & S2 heard, RRR. No JVD, murmurs, rubs, gallops or clicks. No pedal edema. Telemetry: SR.  Gastrointestinal system: Abdomen is nondistended, soft and nontender. No organomegaly or masses felt. Normal bowel sounds heard. Central nervous system: Alert and oriented. No focal neurological deficits. Extremities: Symmetric 5 x 5 power. Skin: No rashes, lesions or ulcers. Multiple tattoos.  Psychiatry:  Judgement and insight appear normal. Mood & affect appropriate.     Data Reviewed: I have personally reviewed following labs and imaging studies  CBC:  Recent Labs Lab 12/01/16 2142  WBC 7.2  HGB 16.9  HCT 48.1  MCV 82.4  PLT 227   Basic Metabolic Panel:  Recent Labs Lab 12/01/16 2142 12/02/16 0601  NA 141 139  K 3.2* 3.8  CL 106 104  CO2 22 24  GLUCOSE 110* 152*  BUN 10 11  CREATININE 1.00 1.03  CALCIUM 9.7 9.6   GFR: Estimated Creatinine Clearance: 100.7 mL/min (by C-G formula based on SCr of 1.03 mg/dL). Liver Function Tests: No results for input(s): AST, ALT, ALKPHOS, BILITOT, PROT, ALBUMIN in the last 168 hours.  Recent Labs Lab 12/01/16 2142  LIPASE 14   No results for input(s): AMMONIA in the last 168 hours. Coagulation Profile: No results for input(s): INR, PROTIME in the last 168 hours. Cardiac Enzymes: No results for input(s): CKTOTAL, CKMB, CKMBINDEX, TROPONINI in the last 168 hours. BNP (last 3 results) No results for input(s): PROBNP in the last 8760 hours. HbA1C: No results for input(s): HGBA1C in the last 72 hours. CBG: No results for input(s): GLUCAP in the last 168 hours. Lipid Profile: No results for input(s): CHOL, HDL, LDLCALC, TRIG, CHOLHDL, LDLDIRECT in the last 72 hours. Thyroid Function Tests: No results for input(s): TSH, T4TOTAL, FREET4, T3FREE, THYROIDAB in the last 72 hours. Anemia Panel: No results for input(s): VITAMINB12, FOLATE, FERRITIN, TIBC, IRON, RETICCTPCT in the last 72 hours.  Sepsis Labs:  Recent Labs Lab 12/01/16 2142 12/01/16 2153 12/02/16 0045 12/02/16 2104 12/02/16 2320  WBC 7.2  --   --   --   --   LATICACIDVEN  --  3.39* 3.19* 1.8 1.3    Recent Results (from the past 240 hour(s))  Culture, blood (Routine X 2) w Reflex to ID Panel     Status: None (Preliminary result)   Collection Time: 12/01/16 10:08 PM  Result Value Ref Range Status   Specimen Description BLOOD RIGHT ARM  Final   Special  Requests BOTTLES DRAWN AEROBIC AND ANAEROBIC 5ML  Final   Culture NO GROWTH 1 DAY  Final   Report Status PENDING  Incomplete  Culture, blood (Routine X 2) w Reflex to ID Panel     Status: None (Preliminary result)   Collection Time: 12/01/16 10:10 PM  Result Value Ref Range Status   Specimen Description BLOOD LEFT ARM  Final   Special Requests IN PEDIATRIC BOTTLE 3ML  Final   Culture NO GROWTH 1 DAY  Final   Report Status PENDING  Incomplete  Respiratory Panel by PCR     Status: Abnormal   Collection Time: 12/02/16  8:01 AM  Result Value Ref Range Status   Adenovirus NOT DETECTED NOT DETECTED Final   Coronavirus 229E NOT DETECTED NOT DETECTED Final   Coronavirus HKU1 NOT DETECTED NOT DETECTED Final   Coronavirus NL63 NOT DETECTED NOT DETECTED Final   Coronavirus OC43 NOT DETECTED NOT DETECTED Final   Metapneumovirus NOT DETECTED NOT DETECTED Final   Rhinovirus /  Enterovirus DETECTED (A) NOT DETECTED Final   Influenza A NOT DETECTED NOT DETECTED Final   Influenza B NOT DETECTED NOT DETECTED Final   Parainfluenza Virus 1 NOT DETECTED NOT DETECTED Final   Parainfluenza Virus 2 NOT DETECTED NOT DETECTED Final   Parainfluenza Virus 3 NOT DETECTED NOT DETECTED Final   Parainfluenza Virus 4 NOT DETECTED NOT DETECTED Final   Respiratory Syncytial Virus NOT DETECTED NOT DETECTED Final   Bordetella pertussis NOT DETECTED NOT DETECTED Final   Chlamydophila pneumoniae NOT DETECTED NOT DETECTED Final   Mycoplasma pneumoniae NOT DETECTED NOT DETECTED Final         Radiology Studies: Dg Chest 2 View  Result Date: 12/01/2016 CLINICAL DATA:  Acute onset of shortness of breath and cough. Generalized chest pain. Initial encounter. EXAM: CHEST  2 VIEW COMPARISON:  Chest radiograph performed 09/15/2012 FINDINGS: The lungs are well-aerated. Pulmonary vascularity is at the upper limits of normal. There is no evidence of focal opacification, pleural effusion or pneumothorax. The heart is normal in  size; the mediastinal contour is within normal limits. No acute osseous abnormalities are seen. IMPRESSION: No acute cardiopulmonary process seen. Electronically Signed   By: Roanna Raider M.D.   On: 12/01/2016 18:14        Scheduled Meds: . azithromycin  250 mg Oral Daily  . enoxaparin (LOVENOX) injection  40 mg Subcutaneous Q24H  . ipratropium-albuterol  3 mL Nebulization Q6H  . methylPREDNISolone (SOLU-MEDROL) injection  60 mg Intravenous Q6H  . mometasone-formoterol  2 puff Inhalation BID  . montelukast  10 mg Oral QHS  . pantoprazole (PROTONIX) IV  40 mg Intravenous Q24H   Continuous Infusions:   LOS: 1 day   Demari Kropp, MD [facility] Pager [phone]  If 7PM-7AM, please contact night-coverage [email or web address] Password [facility] 12/03/2016, 12:08 PM

## 2016-12-03 NOTE — Progress Notes (Signed)
PULMONARY / CRITICAL CARE MEDICINE   Name: Steven [name] MRN: [id number] DOB: [birth year]    ADMISSION DATE:  12/01/2016 CONSULTATION DATE:  1/12  REFERRING MD:  [facility]  CHIEF COMPLAINT:  SOB  HISTORY OF PRESENT ILLNESS:   26 yo male, smoker, marijuana user, life long asthmatic who lost medicaid coverage at age 26 and is not followed by pulmonary. He presents with 2 days of increasing resp distress, wheezing, yellow sputum after exposure to chimney fumes. He had proved refractory on interventions and PCCM was called to evaluate. He is in moderated distress but not acutely ill. We will treat with steroids, BD,s, PPI, and NIMVS and if he fails we will move to ICU.  SUBJECTIVE:  Nauseated- has Zofran order. Breathing not much improved, no pain  VITAL SIGNS: BP (!) 112/59 (BP Location: Left Arm)   Pulse 86   Temp 98.2 F (36.8 C) (Oral)   Resp 19   Ht 5\' 7"  (1.702 m)   Wt 71.3 kg (157 lb 3.2 oz)   SpO2 98%   BMI 24.62 kg/m   HEMODYNAMICS:    VENTILATOR SETTINGS: Vent Mode: Stand-by  INTAKE / OUTPUT: I/O last 3 completed shifts: In: 3370 [P.O.:1320; IV Piggyback:2050] Out: 1950 [Urine:1950]  PHYSICAL EXAMINATION: General:  Anxious, muscular male in tripod position, retching clear fluid into bag Neuro:  Anxious but intact HEENT:  No JVD/LAN Cardiovascular:  HSR RRR Lungs:  Coarse bilateral exp wheeze Abdomen:   Musculoskeletal: Intact Skin:  Warm and dry, many tatoos  LABS:  BMET  Recent Labs Lab 12/01/16 2142 12/02/16 0601  NA 141 139  K 3.2* 3.8  CL 106 104  CO2 22 24  BUN 10 11  CREATININE 1.00 1.03  GLUCOSE 110* 152*    Electrolytes  Recent Labs Lab 12/01/16 2142 12/02/16 0601  CALCIUM 9.7 9.6    CBC  Recent Labs Lab 12/01/16 2142  WBC 7.2  HGB 16.9  HCT 48.1  PLT 227    Coag's No results for input(s): APTT, INR in the last 168 hours.  Sepsis Markers  Recent Labs Lab 12/02/16 0045 12/02/16 2104 12/02/16 2320  LATICACIDVEN  3.19* 1.8 1.3    ABG  Recent Labs Lab 12/02/16 0118  PHART 7.319*  PCO2ART 43.2  PO2ART 53.0*    Liver Enzymes No results for input(s): AST, ALT, ALKPHOS, BILITOT, ALBUMIN in the last 168 hours.  Cardiac Enzymes No results for input(s): TROPONINI, PROBNP in the last 168 hours.  Glucose No results for input(s): GLUCAP in the last 168 hours.  Imaging No results found.   STUDIES:    CULTURES:  ANTIBIOTICS: 1/11 zithromax>>  SIGNIFICANT EVENTS:   LINES/TUBES:   DISCUSSION: 26 yo male, smoker, marijuana user, life long asthmatic who lost medicaid coverage at age 26 and is not followed by pulmonary. He presents with 2 days of increasing resp distress, wheezing, yellow sputum after exposure to chimney fumes. He had proved refractory on interventions and PCCM was called to evaluate. He is in some distress, mainly due to nausea.  We will treat with steroids, BD,s, PPI, and NIMVS and if he fails we will move to ICU.  ASSESSMENT / PLAN:  PULMONARY A: Acute on chronic asthma with exacerbation (provoked by chimney smoke) Child hood asthma poorly controlled due to lack of finances. Tobacco abuse Marijuana use. P:   O2 as needed Steroids BD as needed NIMVS Anxiolytics If he fails treatment then will need ICU/Intubation.  CARDIOVASCULAR A:  Mild tachycardia P:  Follow HR  RENAL A:   No acute issue P:     GASTROINTESTINAL A:   Nausea and vomiting P:   PPI in case of reflux Ordering CMET  HEMATOLOGIC  A:   No acute issues P:  Follow labs  INFECTIOUS A:   Bronchitis P:   Zithro  ENDOCRINE A:   No acute issues P:   Follow glucose on steroids  NEUROLOGIC A:   Awake and alert with mild to moderate anxiety P:   RASS goal: 0 Mild sedation if needed   FAMILY  - Updates: Pt updated at bedside   CD Maple Hudson, MD [facility] PCCM Pager [phone] till 3 pm If no answer page [phone] 12/03/2016, 11:35 AM   Attending Note:  I have  examined patient, reviewed labs, studies and notes. I have discussed the case with S Minor, and I agree with the data and plans as amended above. 26 yo man with asthma since childhood, not currently on therapy or with follow up. He has been getting by borrowing SABA from others. He was at baseline until 2-3 days PTA when he was exposed to smoke and fumes from chimney. He developed persistent wheeze and dyspnea, was admitted with acute exacerbation asthma. On my eval this am he was awake, uncomfortable sitting upright on side of bed. He had accessory muscle use but was not tachypneic. He had diffuse bilateral exp wheezes. We will continue his solumedrol, scheduled and prn BD's. Will start BiPAp prn to ease WOB. If he decompensates in any way will move him to ICU. Agree with singulair, azithro.   Independent critical care time is 40 minutes.   CD Young, MD 12/03/2016, 11:35 AM [facility] [phone]   If after 3:00 PM [phone]

## 2016-12-04 DIAGNOSIS — J45902 Unspecified asthma with status asthmaticus: Secondary | ICD-10-CM

## 2016-12-04 MED ORDER — PANTOPRAZOLE SODIUM 40 MG PO TBEC
40.0000 mg | DELAYED_RELEASE_TABLET | Freq: Every day | ORAL | Status: DC
  Administered 2016-12-04 – 2016-12-05 (×2): 40 mg via ORAL
  Filled 2016-12-04 (×2): qty 1

## 2016-12-04 MED ORDER — METHYLPREDNISOLONE SODIUM SUCC 40 MG IJ SOLR
40.0000 mg | Freq: Three times a day (TID) | INTRAMUSCULAR | Status: DC
  Administered 2016-12-04 – 2016-12-05 (×3): 40 mg via INTRAVENOUS
  Filled 2016-12-04 (×3): qty 1

## 2016-12-04 NOTE — Progress Notes (Signed)
Notified per CCMD, pt in accelerated junctional.  Heart rate 80s.  Pt asymptomatic.  Will cont to monitor closely.

## 2016-12-04 NOTE — Progress Notes (Signed)
PULMONARY / CRITICAL CARE MEDICINE   Name: Steven [name] MRN: [id number] DOB: Jun 14, 1991    ADMISSION DATE:  12/01/2016 CONSULTATION DATE:  1/12  REFERRING MD:  [facility]  CHIEF COMPLAINT:  SOB  HISTORY OF PRESENT ILLNESS:   26 yo male, smoker, marijuana user, life long asthmatic who lost medicaid coverage at age 87 and is not followed by pulmonary. He presents with 2 days of increasing resp distress, wheezing, yellow sputum after exposure to chimney fumes. He had proved refractory on interventions and PCCM was called to evaluate. He is in moderated distress but not acutely ill. We will treat with steroids, BD,s, PPI, and NIMVS and if he fails we will move to ICU.  SUBJECTIVE:  Says baseline is clear chest, sensitive to weather. Used to have nebulizer machine "back in the day" and it would help greatly. He buys inhalers where/ when he can.  VITAL SIGNS: BP (!) 112/59 (BP Location: Left Arm)   Pulse 86   Temp 98.2 F (36.8 C) (Oral)   Resp 19   Ht 5\' 7"  (1.702 m)   Wt 71.3 kg (157 lb 3.2 oz)   SpO2 98%   BMI 24.62 kg/m   HEMODYNAMICS:    VENTILATOR SETTINGS: Vent Mode: Stand-by  INTAKE / OUTPUT: I/O last 3 completed shifts: In: 3370 [P.O.:1320; IV Piggyback:2050] Out: 1950 [Urine:1950]  PHYSICAL EXAMINATION: General:  Anxious, lying in bed w girlfriend Neuro:  Calmer than yesterday HEENT:  No JVD/LAN Cardiovascular:  HSR RRR Lungs:  Coarse bilateral exp wheeze, rattling cough Abdomen:   Musculoskeletal: Intact Skin:  Warm and dry, many tatoos  LABS:  BMET  Recent Labs Lab 12/01/16 2142 12/02/16 0601  NA 141 139  K 3.2* 3.8  CL 106 104  CO2 22 24  BUN 10 11  CREATININE 1.00 1.03  GLUCOSE 110* 152*    Electrolytes  Recent Labs Lab 12/01/16 2142 12/02/16 0601  CALCIUM 9.7 9.6    CBC  Recent Labs Lab 12/01/16 2142  WBC 7.2  HGB 16.9  HCT 48.1  PLT 227    Coag's No results for input(s): APTT, INR in the last 168 hours.  Sepsis  Markers  Recent Labs Lab 12/02/16 0045 12/02/16 2104 12/02/16 2320  LATICACIDVEN 3.19* 1.8 1.3    ABG  Recent Labs Lab 12/02/16 0118  PHART 7.319*  PCO2ART 43.2  PO2ART 53.0*    Liver Enzymes No results for input(s): AST, ALT, ALKPHOS, BILITOT, ALBUMIN in the last 168 hours.  Cardiac Enzymes No results for input(s): TROPONINI, PROBNP in the last 168 hours.  Glucose No results for input(s): GLUCAP in the last 168 hours.  Imaging No results found.   STUDIES:    CULTURES:  ANTIBIOTICS: 1/11 zithromax>>  SIGNIFICANT EVENTS:   LINES/TUBES:   DISCUSSION: 26 yo male, smoker, marijuana user, life long asthmatic who lost medicaid coverage at age 35 and is not followed by pulmonary. He presents with 2 days of increasing resp distress, wheezing, yellow sputum after exposure to chimney fumes. He had proved refractory on interventions and PCCM was called to evaluate. Nausea improved.  ASSESSMENT / PLAN:  PULMONARY A: Acute on chronic asthma with exacerbation (provoked by chimney smoke) Child hood asthma poorly controlled due to lack of finances. Tobacco abuse Marijuana use. Recommend arranging home nebulizer with ipratropium-albuterol (DuoNeb) sufficient to use very 6 hours Would send out on prednisone to maintain 10 mg day for 2 weeks He should have access to outpatient care- perhaps [facility] secretary  P:   Steroids BD as needed NIMVS Anxiolytics   CARDIOVASCULAR A:  Mild tachycardia P:  Follow HR  RENAL A:   No acute issue P:     GASTROINTESTINAL A:   Nausea and vomiting P:   PPI in case of reflux Ordering CMET  HEMATOLOGIC  A:   No acute issues P:  Follow labs  INFECTIOUS A:   Bronchitis P:   Zithro  ENDOCRINE A:   No acute issues P:   Follow glucose on steroids  NEUROLOGIC A:   Awake and alert with mild to moderate anxiety P:   RASS goal: 0 Mild sedation if needed   FAMILY  - Updates:  Pt updated at bedside   CD Maple HudsonYoung, MD [facility] Pager [phone] till 3 pm If no answer page [phone] 12/03/2016, 11:35 AM   Attending Note:  I have examined patient, reviewed labs, studies and notes. I have discussed the case with S Minor, and I agree with the data and plans as amended above. 26 yo man with asthma since childhood, not currently on therapy or with follow up. He has been getting by borrowing SABA from others. He was at baseline until 2-3 days PTA when he was exposed to smoke and fumes from chimney. He developed persistent wheeze and dyspnea, was admitted with acute exacerbation asthma. On my eval this am he was awake, uncomfortable sitting upright on side of bed. He had accessory muscle use but was not tachypneic. He had diffuse bilateral exp wheezes. We will continue his solumedrol, scheduled and prn BD's. Will start BiPAp prn to ease WOB. If he decompensates in any way will move him to ICU. Agree with singulair, azithro.   Independent critical care time is 40 minutes.   CD Thad Osoria, MD 12/03/2016, 11:35 AM [facility] [phone]   If after 3:00 PM [phone]

## 2016-12-04 NOTE — Plan of Care (Signed)
Problem: Fluid Volume: Goal: Adequate nutrition will be maintained Outcome: Progressing Pt reports not having much appetite.  Offered and encouraged PO intake multiple times throughout shift.

## 2016-12-04 NOTE — Progress Notes (Signed)
PROGRESS NOTE  Steven [name]  ZOX:[id number] DOB: 11/22/1990  DOA: 12/01/2016 PCP: Default, Provider, MD   Brief Narrative:  26 year old male with PMH of childhood asthma/severe persistent asthma, tobacco and marijuana abuse, presented to ED with 2 day history of productive cough, worsening dyspnea and wheezing. He was hypoxic in the ED and required nonrebreather mask to maintain saturation in the 90s. Admitted to stepdown unit. Despite steroids, bronchodilators and oxygen, continues to have significant dyspnea, wheezing and active accessory respiratory muscles. PCCM consulted for assistance with management of status asthmaticus. Treated briefly with BiPAP which he did not tolerate. Anxiety issues. Back on nasal cannula. Clinically improved.   Assessment & Plan:   Principal Problem:   Status asthmaticus Active Problems:   Severe persistent asthma   Acute respiratory failure with hypoxia (HCC)   Nausea with vomiting   1. Acute asthma exacerbation (status asthmaticus) precipitated by acute viral URI (rhinovirus/enterovirus) complicating poorly controlled severe persistent asthma: Indicates that he uses a "red inhaler" regularly and "blue inhaler" as needed multiple times a day. His symptoms were provoked by chimney smoke. He has had difficulty seeing physicians or getting medications due to lack of insurance. Admitted to stepdown. RSV panel: Positive for rhinovirus/enterovirus. Influenza A & B PCR: Neg, blood cultures 2: Negative to date. ABG on 12/02/16 at 1:18 AM: PH 7.32, PCO2 43, PCO2 53 and oxygen saturation 85%. Continue scheduled and when necessary BD's, IV Solu-Medrol, Z-Pak for presumed acute bronchitis. CCM consultation and follow-up appreciated. Continues to improve. Hypoxia resolved. We'll reduce Solu-Medrol and consider transitioning to oral prednisone taper in a.m. at discharge. 2. Acute respiratory failure with hypoxia: Management as per problem #1. Hypoxia resolved. 3. Tobacco  abuse: Cessation counseled  4. Marijuana abuse: Cessation counseled 5. Sinus tachycardia, mild: Related to problem #1 and BD's. Resolved. 6. Hypokalemia: Replaced. 7. Elevated lactate: Likely secondary to problem #1 and 2. Management as above. Resolved.   DVT prophylaxis: Lovenox Code Status: Full Family Communication: Discussed in detail with patient. No family at bedside.  Disposition Plan: DC home when medically stable, possibly 1/15.   Consultants:   PCCM  Procedures:   BiPAP  Antimicrobials:   Z-Pak 1/12>    Subjective: Denies complaints. Denies cough or chest pain. States that his breathing continues to improve. As per RN, no acute issues.  Objective:  Vitals:   12/03/16 2137 12/04/16 0300 12/04/16 0724 12/04/16 0728  BP: 125/85 125/85    Pulse: 94 90    Resp: 17 12    Temp: 98.9 F (37.2 C) 98.5 F (36.9 C)    TempSrc: Oral Oral    SpO2: 95% 98% 94% 97%  Weight:  67.3 kg (148 lb 4.8 oz)    Height:        Intake/Output Summary (Last 24 hours) at 12/04/16 1308 Last data filed at 12/04/16 0800  Gross per 24 hour  Intake              180 ml  Output              200 ml  Net              -20 ml   Filed Weights   12/02/16 1828 12/04/16 0300  Weight: 71.3 kg (157 lb 3.2 oz) 67.3 kg (148 lb 4.8 oz)    Examination:  General exam: Moderately built and nourished pleasant young male lying comfortably supine in bed. Looks much improved compared to 1/12 Respiratory system: Improved breath sounds. Occasional expiratory rhonchi  but otherwise clear to auscultation. No increased work of breathing. Able to speak in full sentences. Cardiovascular system: S1 & S2 heard, RRR. No JVD, murmurs, rubs, gallops or clicks. No pedal edema. Telemetry: SR.  Gastrointestinal system: Abdomen is nondistended, soft and nontender. No organomegaly or masses felt. Normal bowel sounds heard. Central nervous system: Alert and oriented. No focal neurological deficits. Extremities:  Symmetric 5 x 5 power. Skin: No rashes, lesions or ulcers. Multiple tattoos.  Psychiatry: Judgement and insight appear normal. Mood & affect appropriate.     Data Reviewed: I have personally reviewed following labs and imaging studies  CBC:  Recent Labs Lab 12/01/16 2142  WBC 7.2  HGB 16.9  HCT 48.1  MCV 82.4  PLT 227   Basic Metabolic Panel:  Recent Labs Lab 12/01/16 2142 12/02/16 0601 12/03/16 1149  NA 141 139 136  K 3.2* 3.8 4.2  CL 106 104 100*  CO2 22 24 25   GLUCOSE 110* 152* 155*  BUN 10 11 17   CREATININE 1.00 1.03 0.95  CALCIUM 9.7 9.6 9.7   GFR: Estimated Creatinine Clearance: 109.2 mL/min (by C-G formula based on SCr of 0.95 mg/dL). Liver Function Tests:  Recent Labs Lab 12/03/16 1149  AST 74*  ALT 51  ALKPHOS 47  BILITOT 2.1*  PROT 7.3  ALBUMIN 4.3    Recent Labs Lab 12/01/16 2142  LIPASE 14   No results for input(s): AMMONIA in the last 168 hours. Coagulation Profile: No results for input(s): INR, PROTIME in the last 168 hours. Cardiac Enzymes: No results for input(s): CKTOTAL, CKMB, CKMBINDEX, TROPONINI in the last 168 hours. BNP (last 3 results) No results for input(s): PROBNP in the last 8760 hours. HbA1C: No results for input(s): HGBA1C in the last 72 hours. CBG: No results for input(s): GLUCAP in the last 168 hours. Lipid Profile: No results for input(s): CHOL, HDL, LDLCALC, TRIG, CHOLHDL, LDLDIRECT in the last 72 hours. Thyroid Function Tests: No results for input(s): TSH, T4TOTAL, FREET4, T3FREE, THYROIDAB in the last 72 hours. Anemia Panel: No results for input(s): VITAMINB12, FOLATE, FERRITIN, TIBC, IRON, RETICCTPCT in the last 72 hours.  Sepsis Labs:  Recent Labs Lab 12/01/16 2142 12/01/16 2153 12/02/16 0045 12/02/16 2104 12/02/16 2320  WBC 7.2  --   --   --   --   LATICACIDVEN  --  3.39* 3.19* 1.8 1.3    Recent Results (from the past 240 hour(s))  Culture, blood (Routine X 2) w Reflex to ID Panel     Status:  None (Preliminary result)   Collection Time: 12/01/16 10:08 PM  Result Value Ref Range Status   Specimen Description BLOOD RIGHT ARM  Final   Special Requests BOTTLES DRAWN AEROBIC AND ANAEROBIC  Final   Culture NO GROWTH 2 DAYS  Final   Report Status PENDING  Incomplete  Culture, blood (Routine X 2) w Reflex to ID Panel     Status: None (Preliminary result)   Collection Time: 12/01/16 10:10 PM  Result Value Ref Range Status   Specimen Description BLOOD LEFT ARM  Final   Special Requests IN PEDIATRIC BOTTLE  Final   Culture NO GROWTH 2 DAYS  Final   Report Status PENDING  Incomplete  Respiratory Panel by PCR     Status: Abnormal   Collection Time: 12/02/16  8:01 AM  Result Value Ref Range Status   Adenovirus NOT DETECTED NOT DETECTED Final   Coronavirus 229E NOT DETECTED NOT DETECTED Final   Coronavirus HKU1 NOT DETECTED  NOT DETECTED Final   Coronavirus NL63 NOT DETECTED NOT DETECTED Final   Coronavirus OC43 NOT DETECTED NOT DETECTED Final   Metapneumovirus NOT DETECTED NOT DETECTED Final   Rhinovirus / Enterovirus DETECTED (A) NOT DETECTED Final   Influenza A NOT DETECTED NOT DETECTED Final   Influenza B NOT DETECTED NOT DETECTED Final   Parainfluenza Virus 1 NOT DETECTED NOT DETECTED Final   Parainfluenza Virus 2 NOT DETECTED NOT DETECTED Final   Parainfluenza Virus 3 NOT DETECTED NOT DETECTED Final   Parainfluenza Virus 4 NOT DETECTED NOT DETECTED Final   Respiratory Syncytial Virus NOT DETECTED NOT DETECTED Final   Bordetella pertussis NOT DETECTED NOT DETECTED Final   Chlamydophila pneumoniae NOT DETECTED NOT DETECTED Final   Mycoplasma pneumoniae NOT DETECTED NOT DETECTED Final         Radiology Studies: No results found.      Scheduled Meds: . azithromycin  250 mg Oral Daily  . enoxaparin (LOVENOX) injection  40 mg Subcutaneous Q24H  . ipratropium-albuterol  3 mL Nebulization TID  . methylPREDNISolone (SOLU-MEDROL) injection  40 mg Intravenous Q8H  .  mometasone-formoterol  2 puff Inhalation BID  . montelukast  10 mg Oral QHS  . pantoprazole  40 mg Oral Daily   Continuous Infusions:   LOS: 2 days   [facility] HealthNGALGI,Tyrrell Stephens, MD [facility] Pager [phone]  If 7PM-7AM, please contact night-coverage [email or web address] Password [email or web address] 12/04/2016, 1:08 PM

## 2016-12-05 ENCOUNTER — Telehealth: Payer: Self-pay

## 2016-12-05 MED ORDER — AZITHROMYCIN 250 MG PO TABS: 250.0000 mg | ORAL_TABLET | Freq: Once | ORAL | 0 refills | Status: AC

## 2016-12-05 MED ORDER — PREDNISONE 10 MG PO TABS: ORAL_TABLET | ORAL | 0 refills | Status: DC

## 2016-12-05 MED ORDER — MOMETASONE FURO-FORMOTEROL FUM 100-5 MCG/ACT IN AERO: 2.0000 | INHALATION_SPRAY | Freq: Two times a day (BID) | RESPIRATORY_TRACT | 0 refills | Status: DC

## 2016-12-05 MED ORDER — MONTELUKAST SODIUM 10 MG PO TABS: 10.0000 mg | ORAL_TABLET | Freq: Every day | ORAL | 0 refills | Status: DC

## 2016-12-05 MED ORDER — ALBUTEROL SULFATE HFA 108 (90 BASE) MCG/ACT IN AERS: 2.0000 | INHALATION_SPRAY | RESPIRATORY_TRACT | 0 refills | Status: DC | PRN

## 2016-12-05 NOTE — Care Management Note (Addendum)
Case Management Note  Patient Details  Name: Steven [name] MRN: [id number] Date of Birth: 13-Nov-1991  Subjective/Objective: Pt presented for Asthma Exacerbation. Pt is not working at this time and is without insurance. Pt is without PCP. [facility]. [facility] and she was able to schedule an appointment and date placed on AVS.                Action/Plan: CM did provide pt with MATCH Letter and Pharmacy List. [facility] and medications will range between $4.00-$10.00. No further needs from CM @ this time.   Expected Discharge Date:                  Expected Discharge Plan:  Home/Self Care  In-House Referral:  NA  Discharge planning Services  CM Consult, Follow-up appt scheduled, Indigent [facility] Clinic, Medication Assistance, MATCH Program  Post Acute Care Choice:  NA Choice offered to:  NA  DME Arranged:  N/A DME Agency:  NA  HH Arranged:  NA HH Agency:  NA  Status of Service:  Completed, signed off  If discussed at Long Length of Stay Meetings, dates discussed:    Additional Comments: 1350 12-05-16 Tomi BambergerBrenda Graves-Bigelow, RN,BSN [phone] Staff RN alerted Case Manager that pt left AMA. MATCH Letter was given to patient. No Rx's were provided. MATCH will not be able to be utilized.  Gala LewandowskyGraves-Bigelow, [place] Boehle Kaye, RN 12/05/2016, 11:27 AM

## 2016-12-05 NOTE — Telephone Encounter (Signed)
Message received from Roselyn BeringBrenda Graves - Mitzie NaBigelow, RN CM [facility]. Appointment scheduled for 12/07/16 @ 1130 and the information was placed on the AVS.  Update provided to B. Elbert EwingsGraves- Bigelow, RN CM.

## 2016-12-05 NOTE — Progress Notes (Signed)
Pt left AMA. Pt states he did not want to stay any longer

## 2016-12-05 NOTE — Discharge Summary (Signed)
Physician Discharge Summary  Steven [name] ZOX:[id number] DOB: [birth year]  PCP: Default, Provider, MD  Patient left the Hospital AGAINST MEDICAL ADVICE.  Admit date: 12/01/2016 Discharge date: 12/05/2016  Recommendations for Outpatient Follow-up:  Patient left the Hospital AGAINST MEDICAL ADVICE. I was informed about this after patient actually left the building. Please see discussion below. 1.  [facility], PCP on 12/07/16 at 11:30 AM. Please follow final blood culture results that were sent from the hospital. 2. Kandice Robinsons, NP/Pulmonology on 12/12/16 at 11 AM.    Discharge Diagnoses:  Principal Problem:   Status asthmaticus Active Problems:   Severe persistent asthma   Acute respiratory failure with hypoxia (HCC)   Nausea with vomiting   Brief/Interim Summary: 26 year old male with PMH of childhood asthma/severe persistent asthma, tobacco and marijuana abuse, presented to ED with 2 day history of productive cough, worsening dyspnea and wheezing. He was hypoxic in the ED and required nonrebreather mask to maintain saturation in the 90s. Admitted to stepdown unit. Despite steroids, bronchodilators and oxygen, continues to have significant dyspnea, wheezing and active accessory respiratory muscles. PCCM consulted for assistance with management of status asthmaticus. Treated briefly with BiPAP which he did not tolerate. Anxiety issues. Back on nasal cannula. Clinically improved.   Assessment & Plan:   1. Acute asthma exacerbation (status asthmaticus) precipitated by acute viral URI (rhinovirus/enterovirus) complicating poorly controlled severe persistent asthma: Indicates that he uses a "red inhaler" regularly and "blue inhaler" as needed multiple times a day. His symptoms were provoked by chimney smoke. He has had difficulty seeing physicians or getting medications due to lack of insurance. Admitted to stepdown. RSV panel: Positive for  rhinovirus/enterovirus. Influenza A & B PCR: Neg, blood cultures 2: Negative to date. ABG on 12/02/16 at 1:18 AM: PH 7.32, PCO2 43, PCO2 53 and oxygen saturation 85%. Treated with scheduled and when necessary BD's, IV Solu-Medrol, Z-Pak for presumed acute bronchitis. CCM consultation and follow-up appreciated. Continued to improve. Hypoxia resolved. Reduced Solu-Medrol with plans tooral prednisone taper at discharge. I saw him this morning. He was laying in bed comfortably along with a male person next to him in bed. He denied complaints including dyspnea. I discussed with him in detail and advised him that I was awaiting pulmonology follow-up (I have discussed with pulmonology team this morning) regarding recommendations for outpatient maintenance medications and pulmonology follow-up given history of poorly controlled asthma and case management follow up to arrange PCP follow-up and medication needs if any. He verbalized understanding and did not inform any intention of hurry to leave or of leaving AMA. Subsequently RN informed me that patient just got up and left AMA despite being made aware of severe health risks if he did not follow-up on all aspects of medical care. I was only informed after patient had left the building. His asthma exacerbation had clinically resolved. He has capacity to make medical decisions but made poor judgment. I then called patient on his cell phone and he stated that he left because he did not have a ride and was going to return after he got his car. I informed him that I had completed the discharge paperwork, sent his prescriptions to the pharmacy that's listed and he was provided with assistance [facility] letter) by case management to get his medications and that he should get his medications and be compliant with them. He was also advised to return to the floor to pick up his discharge instructions for PCP and Pulmonology appointments. He  verbalized understanding and was  appreciative. 2. Acute respiratory failure with hypoxia: Management as per problem #1. Hypoxia resolved. 3. Tobacco abuse: Cessation counseled  4. Marijuana abuse: Cessation counseled 5. Sinus tachycardia, mild: Related to problem #1 and BD's. Resolved. 6. Hypokalemia: Replaced. 7. Elevated lactate: Likely secondary to problem #1 and 2. Management as above. Resolved. 8. Proteinuria: Outpatient evaluation.   Consultants:   PCCM  Procedures:   BiPAP   Discharge Instructions  Discharge Instructions    Activity as tolerated - No restrictions    Complete by:  As directed    Call MD for:  difficulty breathing, headache or visual disturbances    Complete by:  As directed    Call MD for:  extreme fatigue    Complete by:  As directed    Call MD for:  persistant dizziness or light-headedness    Complete by:  As directed    Call MD for:  severe uncontrolled pain    Complete by:  As directed    Call MD for:  temperature >100.4    Complete by:  As directed    Diet general    Complete by:  As directed        Medication List    STOP taking these medications   ibuprofen 200 MG tablet Commonly known as:  ADVIL,MOTRIN     TAKE these medications   albuterol 108 (90 Base) MCG/ACT inhaler Commonly known as:  PROVENTIL HFA;VENTOLIN HFA Inhale 2 puffs into the lungs every 4 (four) hours as needed for wheezing or shortness of breath.   azithromycin 250 MG tablet Commonly known as:  ZITHROMAX Take 1 tablet (250 mg total) by mouth once. Take the last dose on 12/06/16. Start taking on:  12/06/2016   mometasone-formoterol 100-5 MCG/ACT Aero Commonly known as:  DULERA Inhale 2 puffs into the lungs 2 (two) times daily.   montelukast 10 MG tablet Commonly known as:  SINGULAIR Take 1 tablet (10 mg total) by mouth at bedtime.   predniSONE 10 MG tablet Commonly known as:  DELTASONE Take 4 tabs daily for 3 days, then 3 tabs daily for 3 days, then 2 tabs daily for 3 days, then 1 tab  daily for 3 days, then stop.      Follow-up Information    Bevelyn NgoSarah F Groce, NP Follow up on 12/12/2016.   Specialty:  Pulmonary Disease Why:  Appointment at 11:00 AM for hospital follow up of asthma  Contact information: [address] [place] [address] [phone]        [facility]. Go on 12/07/2016.   Why:  at 11:30am for a hospital follow up appointment.  Contact information: [address] [place] [phone]         No Known Allergies   Procedures/Studies: Dg Chest 2 View  Result Date: 12/01/2016 CLINICAL DATA:  Acute onset of shortness of breath and cough. Generalized chest pain. Initial encounter. EXAM: CHEST  2 VIEW COMPARISON:  Chest radiograph performed 09/15/2012 FINDINGS: The lungs are well-aerated. Pulmonary vascularity is at the upper limits of normal. There is no evidence of focal opacification, pleural effusion or pneumothorax. The heart is normal in size; the mediastinal contour is within normal limits. No acute osseous abnormalities are seen. IMPRESSION: No acute cardiopulmonary process seen. Electronically Signed   By: Roanna RaiderJeffery  Chang M.D.   On: 12/01/2016 18:14      Subjective: Seen this morning. No complaints reported. Denied dyspnea,  chest pain, cough, fever or chills. As per RN, no acute issues.  Discharge Exam:  Vitals:   12/04/16 2056 12/05/16 0345 12/05/16 0753 12/05/16 0821  BP: 140/85 139/82  102/60  Pulse: 75 83  65  Resp: 15 15  17   Temp: 98.8 F (37.1 C) 98.4 F (36.9 C)  98.5 F (36.9 C)  TempSrc: Oral Axillary  Oral  SpO2: 94% 97% 96% 96%  Weight:  67.4 kg (148 lb 11.2 oz)    Height:        General exam: Moderately built and nourished pleasant young male lying comfortably supine in bed.  Respiratory system: Clear to auscultation. No increased work of breathing.  Cardiovascular system: S1 & S2 heard, RRR. No JVD, murmurs, rubs, gallops or clicks. No  pedal edema. Telemetry: SR.  Gastrointestinal system: Abdomen is nondistended, soft and nontender. No organomegaly or masses felt. Normal bowel sounds heard. Central nervous system: Alert and oriented. No focal neurological deficits. Extremities: Symmetric 5 x 5 power. Skin: No rashes, lesions or ulcers. Multiple tattoos.  Psychiatry: Judgement and insight appear normal. Mood & affect appropriate.    The results of significant diagnostics from this hospitalization (including imaging, microbiology, ancillary and laboratory) are listed below for reference.     Microbiology: Recent Results (from the past 240 hour(s))  Culture, blood (Routine X 2) w Reflex to ID Panel     Status: None (Preliminary result)   Collection Time: 12/01/16 10:08 PM  Result Value Ref Range Status   Specimen Description BLOOD RIGHT ARM  Final   Special Requests BOTTLES DRAWN AEROBIC AND ANAEROBIC  Final   Culture NO GROWTH 3 DAYS  Final   Report Status PENDING  Incomplete  Culture, blood (Routine X 2) w Reflex to ID Panel     Status: None (Preliminary result)   Collection Time: 12/01/16 10:10 PM  Result Value Ref Range Status   Specimen Description BLOOD LEFT ARM  Final   Special Requests IN PEDIATRIC BOTTLE  Final   Culture NO GROWTH 3 DAYS  Final   Report Status PENDING  Incomplete  Respiratory Panel by PCR     Status: Abnormal   Collection Time: 12/02/16  8:01 AM  Result Value Ref Range Status   Adenovirus NOT DETECTED NOT DETECTED Final   Coronavirus 229E NOT DETECTED NOT DETECTED Final   Coronavirus HKU1 NOT DETECTED NOT DETECTED Final   Coronavirus NL63 NOT DETECTED NOT DETECTED Final   Coronavirus OC43 NOT DETECTED NOT DETECTED Final   Metapneumovirus NOT DETECTED NOT DETECTED Final   Rhinovirus / Enterovirus DETECTED (A) NOT DETECTED Final   Influenza A NOT DETECTED NOT DETECTED Final   Influenza B NOT DETECTED NOT DETECTED Final   Parainfluenza Virus 1 NOT DETECTED NOT DETECTED Final    Parainfluenza Virus 2 NOT DETECTED NOT DETECTED Final   Parainfluenza Virus 3 NOT DETECTED NOT DETECTED Final   Parainfluenza Virus 4 NOT DETECTED NOT DETECTED Final   Respiratory Syncytial Virus NOT DETECTED NOT DETECTED Final   Bordetella pertussis NOT DETECTED NOT DETECTED Final   Chlamydophila pneumoniae NOT DETECTED NOT DETECTED Final   Mycoplasma pneumoniae NOT DETECTED NOT DETECTED Final     Labs:  Basic Metabolic Panel:  Recent Labs Lab 12/01/16 2142 12/02/16 0601 12/03/16 1149  NA 141 139 136  K 3.2* 3.8 4.2  CL 106 104 100*  CO2 22 24 25   GLUCOSE 110* 152* 155*  BUN 10 11 17   CREATININE 1.00 1.03 0.95  CALCIUM  9.7 9.6 9.7   Liver Function Tests:  Recent Labs Lab 12/03/16 1149  AST 74*  ALT 51  ALKPHOS 47  BILITOT 2.1*  PROT 7.3  ALBUMIN 4.3    Recent Labs Lab 12/01/16 2142  LIPASE 14   CBC:  Recent Labs Lab 12/01/16 2142  WBC 7.2  HGB 16.9  HCT 48.1  MCV 82.4  PLT 227   Urinalysis    Component Value Date/Time   COLORURINE AMBER (A) 12/02/2016 0320   APPEARANCEUR CLEAR 12/02/2016 0320   LABSPEC 1.028 12/02/2016 0320   PHURINE 5.0 12/02/2016 0320   GLUCOSEU 50 (A) 12/02/2016 0320   HGBUR NEGATIVE 12/02/2016 0320   BILIRUBINUR NEGATIVE 12/02/2016 0320   KETONESUR 5 (A) 12/02/2016 0320   PROTEINUR 30 (A) 12/02/2016 0320   UROBILINOGEN 1.0 10/18/2011 2247   NITRITE NEGATIVE 12/02/2016 0320   LEUKOCYTESUR NEGATIVE 12/02/2016 0320      Time coordinating discharge: Over 30 minutes  SIGNED:  Marcellus Scott, MD, FACP, FHM. [facility] Pager [phone]  If 7PM-7AM, please contact night-coverage [email or web address] Password [email or web address] 12/05/2016, 2:16 PM

## 2016-12-05 NOTE — Discharge Instructions (Signed)
Asthma, Adult Asthma is a recurring condition in which the airways tighten and narrow. Asthma can make it difficult to breathe. It can cause coughing, wheezing, and shortness of breath. Asthma episodes, also called asthma attacks, range from minor to life-threatening. Asthma cannot be cured, but medicines and lifestyle changes can help control it. What are the causes? Asthma is believed to be caused by inherited (genetic) and environmental factors, but its exact cause is unknown. Asthma may be triggered by allergens, lung infections, or irritants in the air. Asthma triggers are different for each person. Common triggers include:  Animal dander.  Dust mites.  Cockroaches.  Pollen from trees or grass.  Mold.  Smoke.  Air pollutants such as dust, household cleaners, hair sprays, aerosol sprays, paint fumes, strong chemicals, or strong odors.  Cold air, weather changes, and winds (which increase molds and pollens in the air).  Strong emotional expressions such as crying or laughing hard.  Stress.  Certain medicines (such as aspirin) or types of drugs (such as beta-blockers).  Sulfites in foods and drinks. Foods and drinks that may contain sulfites include dried fruit, potato chips, and sparkling grape juice.  Infections or inflammatory conditions such as the flu, a cold, or an inflammation of the nasal membranes (rhinitis).  Gastroesophageal reflux disease (GERD).  Exercise or strenuous activity. What are the signs or symptoms? Symptoms may occur immediately after asthma is triggered or many hours later. Symptoms include:  Wheezing.  Excessive nighttime or early morning coughing.  Frequent or severe coughing with a common cold.  Chest tightness.  Shortness of breath. How is this diagnosed? The diagnosis of asthma is made by a review of your medical history and a physical exam. Tests may also be performed. These may include:  Lung function studies. These tests show how  much air you breathe in and out.  Allergy tests.  Imaging tests such as X-rays. How is this treated? Asthma cannot be cured, but it can usually be controlled. Treatment involves identifying and avoiding your asthma triggers. It also involves medicines. There are 2 classes of medicine used for asthma treatment:  Controller medicines. These prevent asthma symptoms from occurring. They are usually taken every day.  Reliever or rescue medicines. These quickly relieve asthma symptoms. They are used as needed and provide short-term relief. Your health care provider will help you create an asthma action plan. An asthma action plan is a written plan for managing and treating your asthma attacks. It includes a list of your asthma triggers and how they may be avoided. It also includes information on when medicines should be taken and when their dosage should be changed. An action plan may also involve the use of a device called a peak flow meter. A peak flow meter measures how well the lungs are working. It helps you monitor your condition. Follow these instructions at home:  Take medicines only as directed by your health care provider. Speak with your health care provider if you have questions about how or when to take the medicines.  Use a peak flow meter as directed by your health care provider. Record and keep track of readings.  Understand and use the action plan to help minimize or stop an asthma attack without needing to seek medical care.  Control your home environment in the following ways to help prevent asthma attacks:  Do not smoke. Avoid being exposed to secondhand smoke.  Change your heating and air conditioning filter regularly.  Limit your use of fireplaces  and wood stoves.  Get rid of pests (such as roaches and mice) and their droppings.  Throw away plants if you see mold on them.  Clean your floors and dust regularly. Use unscented cleaning products.  Try to have someone  else vacuum for you regularly. Stay out of rooms while they are being vacuumed and for a short while afterward. If you vacuum, use a dust mask from a hardware store, a double-layered or microfilter vacuum cleaner bag, or a vacuum cleaner with a HEPA filter.  Replace carpet with wood, tile, or vinyl flooring. Carpet can trap dander and dust.  Use allergy-proof pillows, mattress covers, and box spring covers.  Wash bed sheets and blankets every week in hot water and dry them in a dryer.  Use blankets that are made of polyester or cotton.  Clean bathrooms and kitchens with bleach. If possible, have someone repaint the walls in these rooms with mold-resistant paint. Keep out of the rooms that are being cleaned and painted.  Wash hands frequently. Contact a health care provider if:  You have wheezing, shortness of breath, or a cough even if taking medicine to prevent attacks.  The colored mucus you cough up (sputum) is thicker than usual.  Your sputum changes from clear or white to yellow, green, gray, or bloody.  You have any problems that may be related to the medicines you are taking (such as a rash, itching, swelling, or trouble breathing).  You are using a reliever medicine more than 2-3 times per week.  Your peak flow is still at 50-79% of your personal best after following your action plan for 1 hour.  You have a fever. Get help right away if:  You seem to be getting worse and are unresponsive to treatment during an asthma attack.  You are short of breath even at rest.  You get short of breath when doing very little physical activity.  You have difficulty eating, drinking, or talking due to asthma symptoms.  You develop chest pain.  You develop a fast heartbeat.  You have a bluish color to your lips or fingernails.  You are light-headed, dizzy, or faint.  Your peak flow is less than 50% of your personal best. This information is not intended to replace advice given to  you by your health care provider. Make sure you discuss any questions you have with your health care provider. Document Released: 11/07/2005 Document Revised: 04/20/2016 Document Reviewed: 06/06/2013 Elsevier Interactive Patient Education  2017 [employer] Inc.  Additional discharge instructions:  Please get your medications reviewed and adjusted by your Primary MD.  Please request your Primary MD to go over all Hospital Tests and Procedure/Radiological results at the follow up, please get all Hospital records sent to your Prim MD by signing hospital release before you go home.  If you had Pneumonia of Lung problems at the Hospital: Please get a 2 view Chest X ray done in 6-8 weeks after hospital discharge or sooner if instructed by your Primary MD.  If you have Congestive Heart Failure: Please call your Cardiologist or Primary MD anytime you have any of the following symptoms:  1) 3 pound weight gain in 24 hours or 5 pounds in 1 week  2) shortness of breath, with or without a dry hacking cough  3) swelling in the hands, feet or stomach  4) if you have to sleep on extra pillows at night in order to breathe  Follow cardiac low salt diet and 1.5 lit/day fluid  restriction.  If you have diabetes Accuchecks 4 times/day, Once in AM empty stomach and then before each meal. Log in all results and show them to your primary doctor at your next visit. If any glucose reading is under 80 or above 300 call your primary MD immediately.  If you have Seizure/Convulsions/Epilepsy: Please do not drive, operate heavy machinery, participate in activities at heights or participate in high speed sports until you have seen by Primary MD or a Neurologist and advised to do so again.  If you had Gastrointestinal Bleeding: Please ask your Primary MD to check a complete blood count within one week of discharge or at your next visit. Your endoscopic/colonoscopic biopsies that are pending at the time of discharge,  will also need to followed by your Primary MD.  Get Medicines reviewed and adjusted. Please take all your medications with you for your next visit with your Primary MD  Please request your Primary MD to go over all hospital tests and procedure/radiological results at the follow up, please ask your Primary MD to get all Hospital records sent to his/her office.  If you experience worsening of your admission symptoms, develop shortness of breath, life threatening emergency, suicidal or homicidal thoughts you must seek medical attention immediately by calling 911 or calling your MD immediately  if symptoms less severe.  You must read complete instructions/literature along with all the possible adverse reactions/side effects for all the Medicines you take and that have been prescribed to you. Take any new Medicines after you have completely understood and accpet all the possible adverse reactions/side effects.   Do not drive or operate heavy machinery when taking Pain medications.   Do not take more than prescribed Pain, Sleep and Anxiety Medications  Special Instructions: If you have smoked or chewed Tobacco  in the last 2 yrs please stop smoking, stop any regular Alcohol  and or any Recreational drug use.  Wear Seat belts while driving.  Please note You were cared for by a hospitalist during your hospital stay. If you have any questions about your discharge medications or the care you received while you were in the hospital after you are discharged, you can call the unit and asked to speak with the hospitalist on call if the hospitalist that took care of you is not available. Once you are discharged, your primary care physician will handle any further medical issues. Please note that NO REFILLS for any discharge medications will be authorized once you are discharged, as it is imperative that you return to your primary care physician (or establish a relationship with a primary care physician if you  do not have one) for your aftercare needs so that they can reassess your need for medications and monitor your lab values.  You can reach the hospitalist office at phone [phone] or fax [phone]   If you do not have a primary care physician, you can call [phone] for a physician referral.

## 2016-12-07 ENCOUNTER — Inpatient Hospital Stay: Payer: Self-pay | Admitting: Critical Care Medicine

## 2016-12-07 LAB — CULTURE, BLOOD (ROUTINE X 2)
CULTURE: NO GROWTH
Culture: NO GROWTH

## 2016-12-10 LAB — DRUG PROFILE, UR, 9 DRUGS (LABCORP)
AMPHETAMINES, URINE: NEGATIVE ng/mL
BARBITURATE, UR: NEGATIVE ng/mL
BENZODIAZEPINE QUANT UR: NEGATIVE ng/mL
CANNABINOID QUANT UR: POSITIVE — AB
COCAINE (METAB.): NEGATIVE ng/mL
METHADONE SCREEN, URINE: NEGATIVE ng/mL
Opiate Quant, Ur: NEGATIVE ng/mL
Phencyclidine, Ur: NEGATIVE ng/mL
Propoxyphene, Urine: NEGATIVE ng/mL

## 2016-12-12 ENCOUNTER — Encounter: Payer: Self-pay | Admitting: Acute Care

## 2016-12-12 ENCOUNTER — Ambulatory Visit (INDEPENDENT_AMBULATORY_CARE_PROVIDER_SITE_OTHER): Payer: Self-pay | Admitting: Acute Care

## 2016-12-12 DIAGNOSIS — J4552 Severe persistent asthma with status asthmaticus: Secondary | ICD-10-CM

## 2016-12-12 DIAGNOSIS — F17210 Nicotine dependence, cigarettes, uncomplicated: Secondary | ICD-10-CM

## 2016-12-12 DIAGNOSIS — Z91199 Patient's noncompliance with other medical treatment and regimen due to unspecified reason: Secondary | ICD-10-CM

## 2016-12-12 DIAGNOSIS — Z9119 Patient's noncompliance with other medical treatment and regimen: Secondary | ICD-10-CM

## 2016-12-12 NOTE — Patient Instructions (Addendum)
It is good to see you today. Please start Dulera 2 puffs twice daily. Rinse mouth after use Continue with your rescue inhaler for break through shortness of breath up to every 6 hours as needed. Start Spiriva at bedtime once you have the prescription. [facility]/14/2016. Present them with your MATCH letter to assist in getting your medications at a reduced cost. Follow up appointment in 4 months with Dr. Christene Slatese Dios to establish care with practice. Please contact office for sooner follow up if symptoms do not improve or worsen or seek emergency care

## 2016-12-12 NOTE — Assessment & Plan Note (Signed)
Hospital admission 12/01/2016-12/05/2016 Continued cigarette and [place] Abuse Medical Non-Compliance ( finances/ personal non-accountability) Did not fill Z-Pack/ Pred taper upon discharge Did not fill RX for Dulera/ Spiriva despite MATCH letter provided at discharge. Did not make [facility] App[facility]ment post discharge Plan: Teach difference between Maintenance and Rescue medications [facility] 12/14/2016 at 3:45 Please start Dulera 2 puffs twice daily. ( Sample provided 12/12/2016) Rinse mouth after use Continue to use  your rescue inhaler for break through shortness of breath up to every 6 hours as needed. Start Spiriva at bedtime once you have the prescription. [facility]14/2016. Present them with your MATCH letter to assist in getting your medications at a reduced cost. Smoking cessation counseling x 3 minutes Trigger awareness counseling x 3 minutes. Follow up appointment in 4 months with Dr. Christene Slatese Dios to establish care with practice. Please contact office for sooner follow up if symptoms do not improve or worsen or seek emergency care

## 2016-12-12 NOTE — Assessment & Plan Note (Addendum)
Frequent ED visits within the last 6 months Admission 12/01/16-12/05/16 Did not fill of take prescribed Z-Pack, prednisone taper at discharge. Did not fill Dulera/ Spiriva Rx. Was given MATCH letter at DC by case management Continues to smoke both cigarettes and marajuana( Both asthma triggers) Plan Counseled patient on the need to be compliant with asthma regimen of maintenance and rescue inhalers. Educated on the difference between maintenance and rescue medications [facility] with medications. Follow up appointment 12/14/2016 with [facility] Follow up appointment with Dr. Christene Slatese Dios in 4 months to establish with practice. Please contact office for sooner follow up if symptoms do not improve or worsen or seek emergency care

## 2016-12-12 NOTE — Progress Notes (Addendum)
History of Present Illness Steven [name] is a 26 y.o. male with history of childhood asthma/ severe persistent asthma, with continued tobacco and marajuana abuse. He is here for hospital follow up. He  lost medicaid coverage at age 26 and is not followed by pulmonary.  Dr. Maple HudsonYoung saw patient in hospital as a consult for CCM.  Case management  did provide pt with MATCH Letter and Pharmacy List. Pt is aware that [facility] as well and medications will range between $4.00-$10.00.    12/12/2016 Hospital Follow Up: Pt presents for hospital follow up. He was admitted 12/01/2016-12/05/2016 with asthma exacerbation. He presented with 2 days of increasing resp distress, wheezing, yellow sputum after exposure to chimney fumes. He had proved refractory on interventions and PCCM was called to evaluate. He was in moderated distress but not acutely ill. He was treated with steroids, BD,s, PPI, and NIMVS. He did not require intubation or ICU monitoring. He was left the hospital against medical advice on 12/05/2016. He was discharged  with prescriptions for a z pack and a prednisone taper. He did not fill either. He did  not take either. He is using Symbicort and Ventolin which he has with him today.Marland Kitchen. He is unclear of which one is maintenance and which one is rescue. He just uses either one when he is short of breath. He has not filled the prescriptions for the [facility] or Singulair provided at discharge . He states the reason is financial. He continues to smoke mariajuana and tobacco. He was given a MATCH letter at discharge to assist with medication expenses. We discussed the need to stop smoking completely.We discussed that this is a trigger for his asthma. [facility], but did not go. He has been rescheduled for 12/14/16 at 3:45. He did not know about this appointment until I told him. He does have a cell phone and was actually on the  phone multiple times throughout this appointment. He was asking how he could possibly work with asthma, and I explained that he needs to be compliant with medications and he needs to quit smoking mariajuana and cigarettes which triggers his asthma. I explained that patients manage asthma every day by being compliant with medications, and being aware of triggers. We will provide pt. with a Dulera Sample to  Assure medications are available  until seen by [facility] on 12/14/2016. He has a rescue inhaler.I explained in detail that he needs to take his MATCH letter to that appointment so they can help his receive reduced cost medications. We discussed the difference between maintenance medications and rescue medications.He did verbalize understanding.He denies chest pain, fever, orthopnea or hemoptysis.  Tests CXR 12/01/2016>> IMPRESSION: No acute cardiopulmonary process seen.    Past medical hx Past Medical History:  Diagnosis Date  . Allergic rhinitis   . Bronchitis   . Hx of tobacco use, presenting hazards to health   . Marijuana abuse   . Severe persistent asthma      Past surgical hx, Family hx, Social hx all reviewed.  Current Outpatient Prescriptions on File Prior to Visit  Medication Sig  . albuterol (PROVENTIL HFA;VENTOLIN HFA) 108 (90 Base) MCG/ACT inhaler Inhale 2 puffs into the lungs every 4 (four) hours as needed for wheezing or shortness of breath.  Marland Kitchen. ibuprofen (ADVIL,MOTRIN) 200 MG tablet Take 800 mg by mouth every 6 (six) hours as needed (for chest tightness).  .Marland Kitchen  mometasone-formoterol (DULERA) 100-5 MCG/ACT AERO Inhale 2 puffs into the lungs 2 (two) times daily.  . montelukast (SINGULAIR) 10 MG tablet Take 1 tablet (10 mg total) by mouth at bedtime.  . predniSONE (DELTASONE) 10 MG tablet Take 4 tabs daily for 3 days, then 3 tabs daily for 3 days, then 2 tabs daily for 3 days, then 1 tab daily for 3 days, then stop.   No current facility-administered medications on file  prior to visit.      No Known Allergies  Review Of Systems:  Constitutional:   No  weight loss, night sweats,  Fevers, chills, fatigue, or  lassitude.  HEENT:   No headaches,  Difficulty swallowing,  Tooth/dental problems, or  Sore throat,                No sneezing, itching, ear ache, nasal congestion, post nasal drip,   CV:  No chest pain,  Orthopnea, PND, swelling in lower extremities, anasarca, dizziness, palpitations, syncope.   GI  No heartburn, indigestion, abdominal pain, nausea, vomiting, diarrhea, change in bowel habits, loss of appetite, bloody stools.   Resp: + shortness of breath with exertion not  at rest.  No excess mucus, no productive cough,  No non-productive cough,  No coughing up of blood.  No change in color of mucus.  + wheezing.  No chest wall deformity  Skin: no rash or lesions.  GU: no dysuria, change in color of urine, no urgency or frequency.  No flank pain, no hematuria   MS:  No joint pain or swelling.  No decreased range of motion.  No back pain.  Psych:  No change in mood or affect. No depression or anxiety.  No memory loss.   Vital Signs BP 118/80 (BP Location: Left Arm, Patient Position: Sitting, Cuff Size: Normal)   Pulse 83   Ht 5\' 7"  (1.702 m)   Wt 159 lb (72.1 kg)   SpO2 97%   BMI 24.90 kg/m    Physical Exam:  General- No distress,  A&Ox3, distracted male, smells of marajuana ENT: No sinus tenderness, TM clear, pale nasal mucosa, no oral exudate,no post nasal drip, no LAN Cardiac: S1, S2, regular rate and rhythm, no murmur Chest: Scant Exp wheeze/ No rales/ dullness; no accessory muscle use, no nasal flaring, no sternal retractions Abd.: Soft Non-tender Ext: No clubbing cyanosis, edema Neuro:  normal strength Skin: No rashes, warm and dry Psych: normal mood and behavior   Assessment/Plan  Severe persistent asthma Hospital admission 12/01/2016-12/05/2016 Continued cigarette and Mariajuana Abuse Medical Non-Compliance ( finances/  personal non-accountability) Did not fill Z-Pack/ Pred taper upon discharge Did not fill RX for Dulera/ Spiriva despite MATCH letter provided at discharge. Did not make [employer] post discharge Plan: Teach difference between Maintenance and Rescue medications Appointment with [facility] 12/14/2016 at 3:45 Please start Dulera 2 puffs twice daily. ( Sample provided 12/12/2016) Rinse mouth after use Continue to use  your rescue inhaler for break through shortness of breath up to every 6 hours as needed. Start Spiriva at bedtime once you have the prescription. [facility]. Present them with your MATCH letter to assist in getting your medications at a reduced cost. Smoking cessation counseling x 3 minutes Trigger awareness counseling x 3 minutes. Follow up appointment in 4 months with Dr. Christene Slates to establish care with practice. Please contact office for sooner follow up if symptoms do not improve or worsen or seek emergency  care     Non compliance with medical treatment Frequent ED visits within the last 6 months Admission 12/01/16-12/05/16 Did not fill of take prescribed Z-Pack, prednisone taper at discharge. Did not fill Dulera/ Spiriva Rx. Was given MATCH letter at DC by case management Continues to smoke both cigarettes and marajuana( Both asthma triggers) Plan Counseled patient on the need to be compliant with asthma regimen of maintenance and rescue inhalers. Educated on the difference between maintenance and rescue medications Encouraged to take [facility] letter to [facility] for financial assistance with medications. [facility] up appointment 12/14/2016 [facility] Follow up appointment with Dr. Christene Slates in 4 months to establish with practice. Please contact office for sooner follow up if symptoms do not improve or worsen or seek emergency care     Bevelyn Ngo, NP 12/12/2016   4:39 PM

## 2016-12-14 ENCOUNTER — Ambulatory Visit: Payer: Self-pay | Attending: Family Medicine | Admitting: Family Medicine

## 2017-05-07 ENCOUNTER — Observation Stay (HOSPITAL_COMMUNITY)
Admission: EM | Admit: 2017-05-07 | Discharge: 2017-05-08 | Disposition: A | Discharge status: Home / Self Care | Payer: Self-pay | Attending: Family Medicine | Admitting: Family Medicine

## 2017-05-07 ENCOUNTER — Encounter (HOSPITAL_COMMUNITY): Payer: Self-pay

## 2017-05-07 ENCOUNTER — Emergency Department (HOSPITAL_COMMUNITY): Payer: Self-pay

## 2017-05-07 DIAGNOSIS — J45901 Unspecified asthma with (acute) exacerbation: Secondary | ICD-10-CM | POA: Diagnosis present

## 2017-05-07 DIAGNOSIS — R739 Hyperglycemia, unspecified: Secondary | ICD-10-CM | POA: Insufficient documentation

## 2017-05-07 DIAGNOSIS — J45909 Unspecified asthma, uncomplicated: Secondary | ICD-10-CM

## 2017-05-07 DIAGNOSIS — R05 Cough: Secondary | ICD-10-CM

## 2017-05-07 DIAGNOSIS — R059 Cough, unspecified: Secondary | ICD-10-CM

## 2017-05-07 DIAGNOSIS — J4541 Moderate persistent asthma with (acute) exacerbation: Principal | ICD-10-CM | POA: Insufficient documentation

## 2017-05-07 DIAGNOSIS — Z87891 Personal history of nicotine dependence: Secondary | ICD-10-CM | POA: Insufficient documentation

## 2017-05-07 MED ORDER — ALBUTEROL SULFATE (2.5 MG/3ML) 0.083% IN NEBU: 2.5000 mg | INHALATION_SOLUTION | RESPIRATORY_TRACT | Status: DC | PRN

## 2017-05-07 MED ORDER — ALBUTEROL SULFATE (2.5 MG/3ML) 0.083% IN NEBU
2.5000 mg | INHALATION_SOLUTION | Freq: Four times a day (QID) | RESPIRATORY_TRACT | Status: DC
  Administered 2017-05-08: 2.5 mg via RESPIRATORY_TRACT
  Filled 2017-05-07: qty 3

## 2017-05-07 MED ORDER — ACETAMINOPHEN 650 MG RE SUPP: 650.0000 mg | Freq: Four times a day (QID) | RECTAL | Status: DC | PRN

## 2017-05-07 MED ORDER — SODIUM CHLORIDE 0.9 % IV BOLUS (SEPSIS)
1000.0000 mL | Freq: Once | INTRAVENOUS | Status: AC
  Administered 2017-05-07: 1000 mL via INTRAVENOUS

## 2017-05-07 MED ORDER — ACETAMINOPHEN 325 MG PO TABS
650.0000 mg | ORAL_TABLET | Freq: Four times a day (QID) | ORAL | Status: DC | PRN
  Administered 2017-05-08: 650 mg via ORAL
  Filled 2017-05-07: qty 2

## 2017-05-07 MED ORDER — ALBUTEROL SULFATE (2.5 MG/3ML) 0.083% IN NEBU
2.5000 mg | INHALATION_SOLUTION | RESPIRATORY_TRACT | Status: DC
  Administered 2017-05-07: 2.5 mg via RESPIRATORY_TRACT
  Filled 2017-05-07: qty 3

## 2017-05-07 MED ORDER — BUDESONIDE 0.25 MG/2ML IN SUSP
0.2500 mg | Freq: Two times a day (BID) | RESPIRATORY_TRACT | Status: DC
  Administered 2017-05-07 – 2017-05-08 (×2): 0.25 mg via RESPIRATORY_TRACT
  Filled 2017-05-07 (×2): qty 2

## 2017-05-07 MED ORDER — LEVALBUTEROL HCL 0.63 MG/3ML IN NEBU
0.6300 mg | INHALATION_SOLUTION | Freq: Once | RESPIRATORY_TRACT | Status: AC
  Administered 2017-05-07: 0.63 mg via RESPIRATORY_TRACT
  Filled 2017-05-07: qty 3

## 2017-05-07 MED ORDER — ENOXAPARIN SODIUM 40 MG/0.4ML ~~LOC~~ SOLN: 40.0000 mg | SUBCUTANEOUS | Status: DC

## 2017-05-07 MED ORDER — METHYLPREDNISOLONE SODIUM SUCC 40 MG IJ SOLR
40.0000 mg | Freq: Two times a day (BID) | INTRAMUSCULAR | Status: DC
  Administered 2017-05-08: 40 mg via INTRAVENOUS
  Filled 2017-05-07: qty 1

## 2017-05-07 MED ORDER — KETOROLAC TROMETHAMINE 30 MG/ML IJ SOLN
30.0000 mg | Freq: Once | INTRAMUSCULAR | Status: AC
Start: 2017-05-07 — End: 2017-05-07
  Administered 2017-05-07: 30 mg via INTRAVENOUS
  Filled 2017-05-07: qty 1

## 2017-05-07 MED ORDER — METHYLPREDNISOLONE SODIUM SUCC 125 MG IJ SOLR
125.0000 mg | Freq: Once | INTRAMUSCULAR | Status: AC
  Administered 2017-05-07: 125 mg via INTRAVENOUS
  Filled 2017-05-07: qty 2

## 2017-05-07 MED ORDER — IPRATROPIUM-ALBUTEROL 0.5-2.5 (3) MG/3ML IN SOLN
3.0000 mL | RESPIRATORY_TRACT | Status: AC
  Administered 2017-05-07 (×3): 3 mL via RESPIRATORY_TRACT
  Filled 2017-05-07: qty 9

## 2017-05-07 NOTE — H&P (Signed)
History and Physical    Steven FasterMelvin Profit ZOX:[id number] DOB: Dec 28, 1990 DOA: 05/07/2017  PCP: Default, Provider, MD  Patient coming from: Home.  Chief Complaint: Shortness of breath.  HPI: Steven [name] is a 26 y.o. male with history of asthma presents to the ER because of worsening shortness of breath with wheezing over the last 3-4 days. Patient states he gets recurrent asthma attack but has not been taking his medications regularly. Denies any chest pain fever or chills but has been having some productive cough.   ED Course: In the ER chest x-ray was unremarkable. Patient was found to be persistently wheezing. Patient is being admitted for asthma exacerbation.  Review of Systems: As per HPI, rest all negative.   Past Medical History:  Diagnosis Date  . Allergic rhinitis   . Bronchitis   . Hx of tobacco use, presenting hazards to health   . Marijuana abuse   . Severe persistent asthma     Past Surgical History:  Procedure Laterality Date  . TONSILLECTOMY  2008     reports that he quit smoking about 5 years ago. His smoking use included Cigarettes. He has never used smokeless tobacco. He reports that he uses drugs, including Marijuana. He reports that he does not drink alcohol.  No Known Allergies  Family History  Problem Relation Age of Onset  . Asthma Mother   . Cancer Mother   . Asthma Brother     Prior to Admission medications   Not on File    Physical Exam: Vitals:   05/07/17 2100 05/07/17 2148 05/07/17 2200 05/07/17 2230  BP: 127/74  (!) 137/95 120/60  Pulse: (!) 120  (!) 113 (!) 113  Resp: 18  (!) 25 (!) 25  Temp:      TempSrc:      SpO2: 94% 95% 95% 94%      Constitutional: Moderately built and nourished. Vitals:   05/07/17 2100 05/07/17 2148 05/07/17 2200 05/07/17 2230  BP: 127/74  (!) 137/95 120/60  Pulse: (!) 120  (!) 113 (!) 113  Resp: 18  (!) 25 (!) 25  Temp:      TempSrc:      SpO2: 94% 95% 95% 94%   Eyes: Anicteric. No pallor. ENMT:  No discharge from the ears eyes nose and mouth. Neck: No JVD appreciated no mass felt. Respiratory: Bilateral expiratory phase or limitations. Cardiovascular: S1 and S2 are tachycardic. Abdomen: Soft nontender bowel sounds present. Musculoskeletal: No edema. No joint effusion. Skin: No rash. Skin appears warm. Neurologic: Alert awake oriented to time place and person. Moves all extremities. Psychiatric: Appears normal. Normal affect.   Labs on Admission: I have personally reviewed following labs and imaging studies  CBC: No results for input(s): WBC, NEUTROABS, HGB, HCT, MCV, PLT in the last 168 hours. Basic Metabolic Panel: No results for input(s): NA, K, CL, CO2, GLUCOSE, BUN, CREATININE, CALCIUM, MG, PHOS in the last 168 hours. GFR: CrCl cannot be calculated (Patient's most recent lab result is older than the maximum 21 days allowed.). Liver Function Tests: No results for input(s): AST, ALT, ALKPHOS, BILITOT, PROT, ALBUMIN in the last 168 hours. No results for input(s): LIPASE, AMYLASE in the last 168 hours. No results for input(s): AMMONIA in the last 168 hours. Coagulation Profile: No results for input(s): INR, PROTIME in the last 168 hours. Cardiac Enzymes: No results for input(s): CKTOTAL, CKMB, CKMBINDEX, TROPONINI in the last 168 hours. BNP (last 3 results) No results for input(s): PROBNP in the last  8760 hours. HbA1C: No results for input(s): HGBA1C in the last 72 hours. CBG: No results for input(s): GLUCAP in the last 168 hours. Lipid Profile: No results for input(s): CHOL, HDL, LDLCALC, TRIG, CHOLHDL, LDLDIRECT in the last 72 hours. Thyroid Function Tests: No results for input(s): TSH, T4TOTAL, FREET4, T3FREE, THYROIDAB in the last 72 hours. Anemia Panel: No results for input(s): VITAMINB12, FOLATE, FERRITIN, TIBC, IRON, RETICCTPCT in the last 72 hours. Urine analysis:    Component Value Date/Time   COLORURINE AMBER (A) 12/02/2016 0320   APPEARANCEUR CLEAR  12/02/2016 0320   LABSPEC 1.028 12/02/2016 0320   PHURINE 5.0 12/02/2016 0320   GLUCOSEU 50 (A) 12/02/2016 0320   HGBUR NEGATIVE 12/02/2016 0320   BILIRUBINUR NEGATIVE 12/02/2016 0320   KETONESUR 5 (A) 12/02/2016 0320   PROTEINUR 30 (A) 12/02/2016 0320   UROBILINOGEN 1.0 10/18/2011 2247   NITRITE NEGATIVE 12/02/2016 0320   LEUKOCYTESUR NEGATIVE 12/02/2016 0320   Sepsis Labs: @LABRCNTIP (procalcitonin:4,lacticidven:4) )No results found for this or any previous visit (from the past 240 hour(s)).   Radiological Exams on Admission: Dg Chest Port 1 View  Result Date: 05/07/2017 CLINICAL DATA:  26 year old male with asthma attack. Shortness of breath. EXAM: PORTABLE CHEST 1 VIEW COMPARISON:  Chest radiograph dated 12/01/2016 FINDINGS: The heart size and mediastinal contours are within normal limits. Both lungs are clear. The visualized skeletal structures are unremarkable. IMPRESSION: No active disease. Electronically Signed   By: Elgie Collard M.D.   On: 05/07/2017 19:45    EKG: Independently reviewed. Sinus tachycardia with nonspecific ST changes.  Assessment/Plan Principal Problem:   Asthma exacerbation    1. Acute asthma exacerbation - patient has been placed on nebulizer treatment Pulmicort and IV steroids. Continue close observation in telemetry. 2. Hyperglycemia likely secondary to steroids. Follow metabolic panel.  I have reviewed patient's old labs and charts.   DVT prophylaxis: Lovenox. Code Status: Full code.  Family Communication: Discussed with patient.  Disposition Plan: Home.  Consults called: None.  Admission status: Observation.    Eduard Clos MD [facility] Pager [phone].  If 7PM-7AM, please contact night-coverage [email or web address] Password [facility]  05/07/2017, 10:56 PM

## 2017-05-07 NOTE — ED Triage Notes (Signed)
He reports asthma flare which started a couple of days ago. He phoned EMS yesterday, who went to his home and administered a h.h.n. Treatment and he improved and did not come to hospital. His asthma persisted and worsened today. He rec'd. Two duoneb treatments and IV methylprednisolone en route to hospital. He arrives short of breath, but is not dyspneic.

## 2017-05-07 NOTE — ED Provider Notes (Signed)
[facility]-EMERGENCY DEPT Provider Note   CSN: [id number] Arrival date & time: 05/07/17  1858     History   Chief Complaint Chief Complaint  Patient presents with  . Asthma    HPI Steven [name] is a 26 y.o. male.  The history is provided by the patient.  Asthma  This is a recurrent problem. The current episode started 12 to 24 hours ago. The problem occurs constantly. The problem has been gradually worsening. Associated symptoms include chest pain and shortness of breath. Pertinent negatives include no abdominal pain and no headaches. The symptoms are aggravated by walking. Nothing relieves the symptoms.   26 year old male who presents for shortness of breath history of asthma, tobacco use. States for the past 3 days has had generalized weakness, cough productive of clear phlegm, congestion, and mild sore throat with subjective fevers and chills. Started to have shortness of breath this morning with wheezing, not improved with inhalers. Reports right-sided chest pain when he coughs. No chest pain at rest. No leg swelling or leg pains. No abdominal pain, vomiting, diarrhea or urinary complaints. No known sick contacts.     Past Medical History:  Diagnosis Date  . Allergic rhinitis   . Bronchitis   . Hx of tobacco use, presenting hazards to health   . Marijuana abuse   . Severe persistent asthma     Patient Active Problem List   Diagnosis Date Noted  . Non compliance with medical treatment 12/12/2016  . Nausea with vomiting 12/03/2016  . Acute bronchitis 09/16/2012  . Status asthmaticus 09/15/2012  . Acute respiratory failure with hypoxia (HCC) 09/15/2012  . Sinus tachycardia 09/15/2012  . Severe persistent asthma   . Allergic rhinitis   . Marijuana abuse   . Hx of tobacco use, presenting hazards to health     Past Surgical History:  Procedure Laterality Date  . TONSILLECTOMY  2008       Home Medications    Prior to Admission medications   Not on File    Family  History Family History  Problem Relation Age of Onset  . Asthma Mother   . Cancer Mother   . Asthma Brother     Social History Social History  Substance Use Topics  . Smoking status: Former Smoker    Types: Cigarettes    Quit date: 11/22/2011  . Smokeless tobacco: Never Used  . Alcohol use No     Comment: Occasional     Allergies   Patient has no known allergies.   Review of Systems Review of Systems  Constitutional: Negative for fever.  Respiratory: Positive for shortness of breath.   Cardiovascular: Positive for chest pain.  Gastrointestinal: Negative for abdominal pain.  Genitourinary: Negative for difficulty urinating.  Allergic/Immunologic: Negative for immunocompromised state.  Neurological: Negative for headaches.  Hematological: Does not bruise/bleed easily.  All other systems reviewed and are negative.    Physical Exam Updated Vital Signs BP (!) 137/95   Pulse (!) 113   Temp 99 F (37.2 C) (Oral)   Resp (!) 25   SpO2 95%   Physical Exam Physical Exam  Nursing note and vitals reviewed. Constitutional: non-toxic, and in no acute distress Head: Normocephalic and atraumatic.  Mouth/Throat: Oropharynx is clear and moist.  Neck: Normal range of motion. Neck supple.  Cardiovascular: Tachycardic rate and regular rhythm.   Pulmonary/Chest: Effort normal. No conversational dyspnea. No accessory muscle usage. Expiratory wheezes in all lung fields.  Abdominal: Soft. There is no tenderness. There is no  rebound and no guarding.  Musculoskeletal: Normal range of motion.  no edema.  Neurological: Alert, no facial droop, fluent speech, moves all extremities symmetrically Skin: Skin is warm and dry.  Psychiatric: Cooperative   ED Treatments / Results  Labs (all labs ordered are listed, but only abnormal results are displayed) Labs Reviewed - No data to display  EKG  EKG Interpretation  Date/Time:  Sunday May 07 2017 19:27:59 EDT Ventricular Rate:  118 PR  Interval:    QRS Duration: 90 QT Interval:  338 QTC Calculation: 474 R Axis:   87 Text Interpretation:  Age not entered, assumed to be  26 years old for purpose of ECG interpretation Sinus tachycardia Atrial premature complex Minimal ST depression, inferior leads aside from tachycardia, similar to prior EKG  Confirmed by Crista CurbLiu, Dana [phone]) on 05/07/2017 7:32:11 PM       Radiology Dg Chest Port 1 View  Result Date: 05/07/2017 CLINICAL DATA:  26 year old male with asthma attack. Shortness of breath. EXAM: PORTABLE CHEST 1 VIEW COMPARISON:  Chest radiograph dated 12/01/2016 FINDINGS: The heart size and mediastinal contours are within normal limits. Both lungs are clear. The visualized skeletal structures are unremarkable. IMPRESSION: No active disease. Electronically Signed   By: Elgie CollardArash  Radparvar M.D.   On: 05/07/2017 19:45    Procedures Procedures (including critical care time) CRITICAL CARE Performed by: Lavera Guiseana Duo Liu   Total critical care time: 35 minutes  Critical care time was exclusive of separately billable procedures and treating other patients.  Critical care was necessary to treat or prevent imminent or life-threatening deterioration.  Critical care was time spent personally by me on the following activities: development of treatment plan with patient and/or surrogate as well as nursing, discussions with consultants, evaluation of patient's response to treatment, examination of patient, obtaining history from patient or surrogate, ordering and performing treatments and interventions, ordering and review of laboratory studies, ordering and review of radiographic studies, pulse oximetry and re-evaluation of patient's condition.  Medications Ordered in ED Medications  methylPREDNISolone sodium succinate (SOLU-MEDROL) 125 mg/2 mL injection 125 mg (125 mg Intravenous Given 05/07/17 1939)  ipratropium-albuterol (DUONEB) 0.5-2.5 (3) MG/3ML nebulizer solution 3 mL (3 mLs Nebulization  Given 05/07/17 1932)  sodium chloride 0.9 % bolus 1,000 mL (0 mLs Intravenous Stopped 05/07/17 2047)  ketorolac (TORADOL) 30 MG/ML injection 30 mg (30 mg Intravenous Given 05/07/17 1939)  sodium chloride 0.9 % bolus 1,000 mL (1,000 mLs Intravenous New Bag/Given 05/07/17 2047)  levalbuterol (XOPENEX) nebulizer solution 0.63 mg (0.63 mg Nebulization Given 05/07/17 2148)     Initial Impression / Assessment and Plan / ED Course  I have reviewed the triage vital signs and the nursing notes.  Pertinent labs & imaging results that were available during my care of the patient were reviewed by me and considered in my medical decision making (see chart for details).     presents with acute asthma exacerbation. Wheezing throughout but no severe respiratory distress or hypoxia. Tachycardic in ED. CXR visualized without edema or pneumonia. Given ivf, solumedrol, breathing treatments x 4 with persistent reported dyspnea and wheezing. Will admit for observation. Accepted by Dr. Toniann FailKakrakandy.  Final Clinical Impressions(s) / ED Diagnoses   Final diagnoses:  Moderate persistent asthma with acute exacerbation    New Prescriptions New Prescriptions   No medications on file     Lavera GuiseLiu, Dana Duo, MD 05/07/17 2222

## 2017-05-08 DIAGNOSIS — J4551 Severe persistent asthma with (acute) exacerbation: Secondary | ICD-10-CM

## 2017-05-08 LAB — CBC
HCT: 45 % (ref 39.0–52.0)
HEMATOCRIT: 46.1 % (ref 39.0–52.0)
HEMOGLOBIN: 16.4 g/dL (ref 13.0–17.0)
Hemoglobin: 15.8 g/dL (ref 13.0–17.0)
MCH: 28.6 pg (ref 26.0–34.0)
MCH: 28.7 pg (ref 26.0–34.0)
MCHC: 35.1 g/dL (ref 30.0–36.0)
MCHC: 35.6 g/dL (ref 30.0–36.0)
MCV: 80.7 fL (ref 78.0–100.0)
MCV: 81.5 fL (ref 78.0–100.0)
PLATELETS: 187 10*3/uL (ref 150–400)
Platelets: 204 10*3/uL (ref 150–400)
RBC: 5.52 MIL/uL (ref 4.22–5.81)
RBC: 5.71 MIL/uL (ref 4.22–5.81)
RDW: 12.8 % (ref 11.5–15.5)
RDW: 12.8 % (ref 11.5–15.5)
WBC: 3 10*3/uL — ABNORMAL LOW (ref 4.0–10.5)
WBC: 5 10*3/uL (ref 4.0–10.5)

## 2017-05-08 LAB — BASIC METABOLIC PANEL
ANION GAP: 8 (ref 5–15)
BUN: 14 mg/dL (ref 6–20)
CALCIUM: 9.2 mg/dL (ref 8.9–10.3)
CO2: 25 mmol/L (ref 22–32)
Chloride: 106 mmol/L (ref 101–111)
Creatinine, Ser: 0.83 mg/dL (ref 0.61–1.24)
GFR calc Af Amer: >60 mL/min (ref >60)
GLUCOSE: 153 mg/dL — AB (ref 65–99)
POTASSIUM: 3.9 mmol/L (ref 3.5–5.1)
Sodium: 139 mmol/L (ref 135–145)

## 2017-05-08 LAB — HIV ANTIBODY (ROUTINE TESTING W REFLEX): HIV SCREEN 4TH GENERATION: NONREACTIVE

## 2017-05-08 LAB — CREATININE, SERUM
CREATININE: 0.92 mg/dL (ref 0.61–1.24)
GFR calc Af Amer: >60 mL/min (ref >60)
GFR calc non Af Amer: >60 mL/min (ref >60)

## 2017-05-08 MED ORDER — BUDESONIDE 0.25 MG/2ML IN SUSP: 0.2500 mg | Freq: Two times a day (BID) | RESPIRATORY_TRACT | 12 refills | Status: DC

## 2017-05-08 MED ORDER — PREDNISONE 10 MG PO TABS: ORAL_TABLET | ORAL | 0 refills | Status: DC

## 2017-05-08 MED ORDER — ALBUTEROL SULFATE (2.5 MG/3ML) 0.083% IN NEBU: 2.5000 mg | INHALATION_SOLUTION | Freq: Four times a day (QID) | RESPIRATORY_TRACT | 12 refills | Status: DC | PRN

## 2017-05-08 MED ORDER — PNEUMOCOCCAL VAC POLYVALENT 25 MCG/0.5ML IJ INJ: 0.5000 mL | INJECTION | INTRAMUSCULAR | Status: DC

## 2017-05-08 MED ORDER — GUAIFENESIN 100 MG/5ML PO SOLN
5.0000 mL | Freq: Four times a day (QID) | ORAL | Status: DC | PRN
  Administered 2017-05-08: 100 mg via ORAL
  Filled 2017-05-08: qty 10

## 2017-05-08 NOTE — Progress Notes (Signed)
Patient is A&Ox4, ambulatory without assist. Discharge instructions reviewed. Questions, concerns denied.

## 2017-05-08 NOTE — Progress Notes (Signed)
This am patient reported difficulty affording medications. Pt also reports not having a primary care provider. After being assessed by provider, pt request to be discharged as soon as possible. Pt was made aware that the case manager would not arrive until 9 am. Pt does not want to wait. Discussed the [employer] $4 list. Pt was encouraged to follow up at the [place] and wellness center.

## 2017-05-08 NOTE — Discharge Summary (Signed)
Physician Discharge Summary  Denver FasterMelvin [name] ZOX:[id number] DOB: Nov 10, 1991 DOA: 05/07/2017  PCP: Default, Provider, MD  Admit date: 05/07/2017 Discharge date: 05/08/2017  Time spent: 25  minutes  Recommendations for Outpatient Follow-up:  1. Follow up PCP in 2 weeks 2. Check blood glucose fasting in 2 weeks   Discharge Diagnoses:  Principal Problem:   Asthma exacerbation   Discharge Condition: Stable  Diet recommendation: Regular diet  Filed Weights   05/08/17 0030  Weight: 70.7 kg (155 lb 13.8 oz)    History of present illness:  26 y.o. male with history of asthma presents to the ER because of worsening shortness of breath with wheezing over the last 3-4 days. Patient states he gets recurrent asthma attack but has not been taking his medications regularly. Denies any chest pain fever or chills but has been having some productive cough.    Hospital Course:   Asthma exacerbation- resolved, not requiring oxygen, will discharge home on Pulmicort neb bid, albuterol neb q 6 hr prn. Will also give five days prednisone taper.  Hyperglycemia- Blood glucose 153 this am, likely from solumedrol. Need to check blood glucose in 2 weeks  Procedures:  none  Consultations:    Discharge Exam: Vitals:   05/08/17 0030 05/08/17 0516  BP: 139/78 136/74  Pulse: 100 86  Resp: 20 20  Temp: 98.4 F (36.9 C) 98.3 F (36.8 C)    General: Appears in no acute distress Cardiovascular: S1S2, RRR Respiratory: bilateral wheezing  Discharge Instructions   Discharge Instructions    Diet - low sodium heart healthy    Complete by:  As directed    Increase activity slowly    Complete by:  As directed      Current Discharge Medication List    START taking these medications   Details  albuterol (PROVENTIL) (2.5 MG/3ML) 0.083% nebulizer solution Take 3 mLs (2.5 mg total) by nebulization every 6 (six) hours as needed for wheezing. Qty: 75 mL, Refills: 12    budesonide (PULMICORT) 0.25  MG/2ML nebulizer solution Take 2 mLs (0.25 mg total) by nebulization 2 (two) times daily. Qty: 60 mL, Refills: 12    predniSONE (DELTASONE) 10 MG tablet Prednisone 40 mg po daily x 1 day then Prednisone 30 mg po daily x 1 day then Prednisone 20 mg po daily x 1 day then Prednisone 10 mg daily x 1 day then stop... Qty: 10 tablet, Refills: 0       No Known Allergies    The results of significant diagnostics from this hospitalization (including imaging, microbiology, ancillary and laboratory) are listed below for reference.    Significant Diagnostic Studies: Dg Chest Port 1 View  Result Date: 05/07/2017 CLINICAL DATA:  26 year old male with asthma attack. Shortness of breath. EXAM: PORTABLE CHEST 1 VIEW COMPARISON:  Chest radiograph dated 12/01/2016 FINDINGS: The heart size and mediastinal contours are within normal limits. Both lungs are clear. The visualized skeletal structures are unremarkable. IMPRESSION: No active disease. Electronically Signed   By: Elgie CollardArash  Radparvar M.D.   On: 05/07/2017 19:45    Microbiology: No results found for this or any previous visit (from the past 240 hour(s)).   Labs: Basic Metabolic Panel:  Recent Labs Lab 05/08/17 0027 05/08/17 0441  NA  --  139  K  --  3.9  CL  --  106  CO2  --  25  GLUCOSE  --  153*  BUN  --  14  CREATININE 0.92 0.83  CALCIUM  --  9.2  Liver Function Tests: No results for input(s): AST, ALT, ALKPHOS, BILITOT, PROT, ALBUMIN in the last 168 hours. No results for input(s): LIPASE, AMYLASE in the last 168 hours. No results for input(s): AMMONIA in the last 168 hours. CBC:  Recent Labs Lab 05/08/17 0027 05/08/17 0441  WBC 5.0 3.0*  HGB 15.8 16.4  HCT 45.0 46.1  MCV 81.5 80.7  PLT 187 204       Signed:  LAMA,GAGAN S MD.  [facility] 05/08/2017, 8:08 AM

## 2017-09-22 ENCOUNTER — Encounter (HOSPITAL_COMMUNITY): Payer: Self-pay | Admitting: Emergency Medicine

## 2017-09-22 DIAGNOSIS — J111 Influenza due to unidentified influenza virus with other respiratory manifestations: Secondary | ICD-10-CM | POA: Insufficient documentation

## 2017-09-22 DIAGNOSIS — J4521 Mild intermittent asthma with (acute) exacerbation: Secondary | ICD-10-CM | POA: Insufficient documentation

## 2017-09-22 NOTE — ED Triage Notes (Addendum)
Pt to ED via [facility].  C/o fever, sweating, body aches, sore throat, nausea, and productive cough with yellow phlegm x 2 days.  Pt hasn't received flu shot.

## 2017-09-23 ENCOUNTER — Emergency Department (HOSPITAL_COMMUNITY)
Admission: EM | Admit: 2017-09-23 | Discharge: 2017-09-23 | Disposition: A | Discharge status: Home / Self Care | Payer: Self-pay | Attending: Emergency Medicine | Admitting: Emergency Medicine

## 2017-09-23 ENCOUNTER — Emergency Department (HOSPITAL_COMMUNITY): Payer: Self-pay

## 2017-09-23 DIAGNOSIS — J111 Influenza due to unidentified influenza virus with other respiratory manifestations: Secondary | ICD-10-CM

## 2017-09-23 DIAGNOSIS — J4521 Mild intermittent asthma with (acute) exacerbation: Secondary | ICD-10-CM

## 2017-09-23 LAB — LIPASE, BLOOD: Lipase: 16 U/L (ref 11–51)

## 2017-09-23 LAB — COMPREHENSIVE METABOLIC PANEL
ALBUMIN: 3.6 g/dL (ref 3.5–5.0)
ALK PHOS: 47 U/L (ref 38–126)
ALT: 18 U/L (ref 17–63)
AST: 58 U/L — ABNORMAL HIGH (ref 15–41)
Anion gap: 11 (ref 5–15)
BUN: 9 mg/dL (ref 6–20)
CALCIUM: 8.1 mg/dL — AB (ref 8.9–10.3)
CHLORIDE: 106 mmol/L (ref 101–111)
CO2: 21 mmol/L — ABNORMAL LOW (ref 22–32)
CREATININE: 0.82 mg/dL (ref 0.61–1.24)
GFR calc Af Amer: >60 mL/min (ref >60)
GFR calc non Af Amer: >60 mL/min (ref >60)
GLUCOSE: 86 mg/dL (ref 65–99)
Potassium: 5.1 mmol/L (ref 3.5–5.1)
SODIUM: 138 mmol/L (ref 135–145)
Total Bilirubin: 5 mg/dL — ABNORMAL HIGH (ref 0.3–1.2)
Total Protein: 6.2 g/dL — ABNORMAL LOW (ref 6.5–8.1)

## 2017-09-23 LAB — CBC WITH DIFFERENTIAL/PLATELET
BASOS ABS: 0 10*3/uL (ref 0.0–0.1)
BASOS PCT: 0 %
EOS ABS: 0.1 10*3/uL (ref 0.0–0.7)
EOS PCT: 1 %
HCT: 43.9 % (ref 39.0–52.0)
HEMOGLOBIN: 15.3 g/dL (ref 13.0–17.0)
Lymphocytes Relative: 6 %
Lymphs Abs: 0.7 10*3/uL (ref 0.7–4.0)
MCH: 29.1 pg (ref 26.0–34.0)
MCHC: 34.9 g/dL (ref 30.0–36.0)
MCV: 83.5 fL (ref 78.0–100.0)
Monocytes Absolute: 1.1 10*3/uL — ABNORMAL HIGH (ref 0.1–1.0)
Monocytes Relative: 10 %
NEUTROS PCT: 83 %
Neutro Abs: 9.2 10*3/uL — ABNORMAL HIGH (ref 1.7–7.7)
PLATELETS: 171 10*3/uL (ref 150–400)
RBC: 5.26 MIL/uL (ref 4.22–5.81)
RDW: 12.9 % (ref 11.5–15.5)
WBC: 11.1 10*3/uL — AB (ref 4.0–10.5)

## 2017-09-23 MED ORDER — IPRATROPIUM-ALBUTEROL 0.5-2.5 (3) MG/3ML IN SOLN
3.0000 mL | Freq: Once | RESPIRATORY_TRACT | Status: AC
  Administered 2017-09-23: 3 mL via RESPIRATORY_TRACT
  Filled 2017-09-23: qty 3

## 2017-09-23 MED ORDER — PREDNISONE 20 MG PO TABS: 60.0000 mg | ORAL_TABLET | Freq: Every day | ORAL | 0 refills | Status: DC

## 2017-09-23 MED ORDER — SODIUM CHLORIDE 0.9 % IV BOLUS (SEPSIS)
1000.0000 mL | Freq: Once | INTRAVENOUS | Status: AC
  Administered 2017-09-23: 1000 mL via INTRAVENOUS

## 2017-09-23 MED ORDER — HYDROCODONE-HOMATROPINE 5-1.5 MG/5ML PO SYRP: 5.0000 mL | ORAL_SOLUTION | Freq: Four times a day (QID) | ORAL | 0 refills | Status: DC | PRN

## 2017-09-23 MED ORDER — KETOROLAC TROMETHAMINE 30 MG/ML IJ SOLN
30.0000 mg | Freq: Once | INTRAMUSCULAR | Status: AC
  Administered 2017-09-23: 30 mg via INTRAVENOUS
  Filled 2017-09-23: qty 1

## 2017-09-23 MED ORDER — ALBUTEROL SULFATE (2.5 MG/3ML) 0.083% IN NEBU
2.5000 mg | INHALATION_SOLUTION | Freq: Once | RESPIRATORY_TRACT | Status: AC
  Administered 2017-09-23: 2.5 mg via RESPIRATORY_TRACT
  Filled 2017-09-23: qty 3

## 2017-09-23 MED ORDER — ALBUTEROL SULFATE HFA 108 (90 BASE) MCG/ACT IN AERS: 2.0000 | INHALATION_SPRAY | RESPIRATORY_TRACT | 2 refills | Status: DC | PRN

## 2017-09-23 MED ORDER — ONDANSETRON HCL 4 MG/2ML IJ SOLN
4.0000 mg | Freq: Once | INTRAMUSCULAR | Status: AC
  Administered 2017-09-23: 4 mg via INTRAVENOUS
  Filled 2017-09-23: qty 2

## 2017-09-23 MED ORDER — PREDNISONE 20 MG PO TABS
60.0000 mg | ORAL_TABLET | Freq: Once | ORAL | Status: AC
  Administered 2017-09-23: 60 mg via ORAL
  Filled 2017-09-23: qty 3

## 2017-09-23 MED ORDER — PROMETHAZINE HCL 25 MG PO TABS: 25.0000 mg | ORAL_TABLET | Freq: Four times a day (QID) | ORAL | 0 refills | Status: DC | PRN

## 2017-09-23 NOTE — ED Provider Notes (Signed)
[facility] EMERGENCY DEPARTMENT Provider Note   CSN: [id number] Arrival date & time: 09/22/17  2329     History   Chief Complaint Chief Complaint  Patient presents with  . flu like symptoms x 2 days    HPI Steven [name] is a 26 y.o. male.  Patient presents to the emergency department for evaluation of fever, chills, sweats, body aches, sinus congestion, sore throat, nausea, vomiting and cough.  Patient reports a history of asthma, has had increased shortness of breath and wheezing.  Symptoms not responding to his inhaler.      Past Medical History:  Diagnosis Date  . Asthma     There are no active problems to display for this patient.   History reviewed. No pertinent surgical history.     Home Medications    Prior to Admission medications   Medication Sig Start Date End Date Taking? Authorizing Provider  cetirizine (ZYRTEC) 10 MG tablet Take 1 tablet (10 mg total) by mouth daily. Patient not taking: Reported on 09/23/2017 01/17/16   Cheri Fowler, PA-C  erythromycin ophthalmic ointment Place a 1/2 inch ribbon of ointment into the lower eyelid 4 times a day for 5-7 days. Patient not taking: Reported on 09/23/2017 01/17/16   Cheri Fowler, PA-C    Family History No family history on file.  Social History Social History  Substance Use Topics  . Smoking status: Never Smoker  . Smokeless tobacco: Never Used  . Alcohol use No     Allergies   Patient has no known allergies.   Review of Systems Review of Systems  Constitutional: Positive for chills, diaphoresis and fever.  Respiratory: Positive for shortness of breath and wheezing.   Gastrointestinal: Positive for nausea and vomiting.  All other systems reviewed and are negative.    Physical Exam Updated Vital Signs BP (!) 144/91   Pulse (!) 111   Temp 98.8 F (37.1 C) (Oral)   Resp 18   SpO2 95%   Physical Exam  Constitutional: He is oriented to person, place, and time. He appears  well-developed and well-nourished. No distress.  HENT:  Head: Normocephalic and atraumatic.  Right Ear: Hearing normal.  Left Ear: Hearing normal.  Nose: Nose normal.  Mouth/Throat: Oropharynx is clear and moist and mucous membranes are normal.  Eyes: Pupils are equal, round, and reactive to light. Conjunctivae and EOM are normal.  Neck: Normal range of motion. Neck supple.  Cardiovascular: Regular rhythm, S1 normal and S2 normal.  Exam reveals no gallop and no friction rub.   No murmur heard. Pulmonary/Chest: Effort normal. No respiratory distress. He has decreased breath sounds. He has wheezes. He exhibits no tenderness.  Abdominal: Soft. Normal appearance and bowel sounds are normal. There is no hepatosplenomegaly. There is no tenderness. There is no rebound, no guarding, no tenderness at McBurney's point and negative Murphy's sign. No hernia.  Musculoskeletal: Normal range of motion.  Neurological: He is alert and oriented to person, place, and time. He has normal strength. No cranial nerve deficit or sensory deficit. Coordination normal. GCS eye subscore is 4. GCS verbal subscore is 5. GCS motor subscore is 6.  Skin: Skin is warm, dry and intact. No rash noted. No cyanosis.  Psychiatric: He has a normal mood and affect. His speech is normal and behavior is normal. Thought content normal.  Nursing note and vitals reviewed.    ED Treatments / Results  Labs (all labs ordered are listed, but only abnormal results are displayed) Labs  Reviewed  CBC WITH DIFFERENTIAL/PLATELET - Abnormal; Notable for the following:       Result Value   WBC 11.1 (*)    Neutro Abs 9.2 (*)    Monocytes Absolute 1.1 (*)    All other components within normal limits  COMPREHENSIVE METABOLIC PANEL - Abnormal; Notable for the following:    CO2 21 (*)    Calcium 8.1 (*)    Total Protein 6.2 (*)    AST 58 (*)    Total Bilirubin 5.0 (*)    All other components within normal limits  LIPASE, BLOOD    EKG   EKG Interpretation None       Radiology Dg Chest 2 View  Result Date: 09/23/2017 CLINICAL DATA:  Fever, diaphoresis and body aches. Productive cough for 2 days. History of asthma. EXAM: CHEST  2 VIEW COMPARISON:  None. FINDINGS: The heart size and mediastinal contours are within normal limits. Both lungs are clear. The visualized skeletal structures are unremarkable. IMPRESSION: Normal chest. Electronically Signed   By: Awilda Metroourtnay  Bloomer M.D.   On: 09/23/2017 05:18    Procedures Procedures (including critical care time)  Medications Ordered in ED Medications  predniSONE (DELTASONE) tablet 60 mg (not administered)  ipratropium-albuterol (DUONEB) 0.5-2.5 (3) MG/3ML nebulizer solution 3 mL (not administered)  albuterol (PROVENTIL) (2.5 MG/3ML) 0.083% nebulizer solution 2.5 mg (not administered)  sodium chloride 0.9 % bolus 1,000 mL (1,000 mLs Intravenous New Bag/Given 09/23/17 0421)  ondansetron (ZOFRAN) injection 4 mg (4 mg Intravenous Given 09/23/17 0421)     Initial Impression / Assessment and Plan / ED Course  I have reviewed the triage vital signs and the nursing notes.  Pertinent labs & imaging results that were available during my care of the patient were reviewed by me and considered in my medical decision making (see chart for details).     Patient presents to the emergency department for evaluation of flulike symptoms.  Patient has been sick for 2 days.  Patient reports a history of asthma, has had increased wheezing and shortness of breath.  Patient complaining of upper respiratory symptoms including cough, sore throat, nasal congestion as well as GI symptoms including nausea and vomiting.  He has had a fever at home.  Patient did have wheezing audible on arrival but has good air movement and normal oxygenation.  Patient administered albuterol and prednisone.  He was given IV fluids.  Blood work unremarkable.  Chest x-ray does not show any evidence of pneumonia.  Will continue  treatment as an outpatient for asthma exacerbation secondary to influenza.  Final Clinical Impressions(s) / ED Diagnoses   Final diagnoses:  Influenza  Mild intermittent asthma with acute exacerbation    New Prescriptions New Prescriptions   No medications on file     Gilda CreasePollina, Ramel Tobon J, MD 09/23/17 [phone]

## 2017-09-23 NOTE — ED Notes (Signed)
Called name x3 to be roomed. No response. 

## 2017-10-08 ENCOUNTER — Emergency Department (HOSPITAL_COMMUNITY)
Admission: EM | Admit: 2017-10-08 | Discharge: 2017-10-09 | Disposition: A | Discharge status: Home / Self Care | Payer: Self-pay | Attending: Emergency Medicine | Admitting: Emergency Medicine

## 2017-10-08 ENCOUNTER — Encounter (HOSPITAL_COMMUNITY): Payer: Self-pay

## 2017-10-08 ENCOUNTER — Emergency Department (HOSPITAL_COMMUNITY): Payer: Self-pay

## 2017-10-08 ENCOUNTER — Other Ambulatory Visit: Payer: Self-pay

## 2017-10-08 DIAGNOSIS — Z87891 Personal history of nicotine dependence: Secondary | ICD-10-CM | POA: Insufficient documentation

## 2017-10-08 DIAGNOSIS — Z79899 Other long term (current) drug therapy: Secondary | ICD-10-CM | POA: Insufficient documentation

## 2017-10-08 DIAGNOSIS — R0789 Other chest pain: Secondary | ICD-10-CM | POA: Insufficient documentation

## 2017-10-08 DIAGNOSIS — J45901 Unspecified asthma with (acute) exacerbation: Secondary | ICD-10-CM | POA: Insufficient documentation

## 2017-10-08 LAB — I-STAT TROPONIN, ED: TROPONIN I, POC: 0 ng/mL (ref 0.00–0.08)

## 2017-10-08 LAB — CBC
HCT: 43.8 % (ref 39.0–52.0)
HEMOGLOBIN: 15.3 g/dL (ref 13.0–17.0)
MCH: 29.5 pg (ref 26.0–34.0)
MCHC: 34.9 g/dL (ref 30.0–36.0)
MCV: 84.4 fL (ref 78.0–100.0)
Platelets: 276 10*3/uL (ref 150–400)
RBC: 5.19 MIL/uL (ref 4.22–5.81)
RDW: 12.8 % (ref 11.5–15.5)
WBC: 5.9 10*3/uL (ref 4.0–10.5)

## 2017-10-08 LAB — BASIC METABOLIC PANEL
ANION GAP: 7 (ref 5–15)
BUN: 10 mg/dL (ref 6–20)
CALCIUM: 9.4 mg/dL (ref 8.9–10.3)
CO2: 28 mmol/L (ref 22–32)
Chloride: 105 mmol/L (ref 101–111)
Creatinine, Ser: 0.92 mg/dL (ref 0.61–1.24)
Glucose, Bld: 94 mg/dL (ref 65–99)
Potassium: 3.7 mmol/L (ref 3.5–5.1)
Sodium: 140 mmol/L (ref 135–145)

## 2017-10-08 MED ORDER — IBUPROFEN 200 MG PO TABS
600.0000 mg | ORAL_TABLET | Freq: Once | ORAL | Status: AC
  Administered 2017-10-08: 600 mg via ORAL
  Filled 2017-10-08: qty 3

## 2017-10-08 MED ORDER — IPRATROPIUM BROMIDE 0.02 % IN SOLN
0.5000 mg | Freq: Once | RESPIRATORY_TRACT | Status: AC
  Administered 2017-10-08: 0.5 mg via RESPIRATORY_TRACT
  Filled 2017-10-08: qty 2.5

## 2017-10-08 MED ORDER — ALBUTEROL SULFATE (2.5 MG/3ML) 0.083% IN NEBU
5.0000 mg | INHALATION_SOLUTION | Freq: Once | RESPIRATORY_TRACT | Status: AC
  Administered 2017-10-08: 5 mg via RESPIRATORY_TRACT
  Filled 2017-10-08: qty 6

## 2017-10-08 MED ORDER — PREDNISONE 20 MG PO TABS
60.0000 mg | ORAL_TABLET | Freq: Once | ORAL | Status: AC
  Administered 2017-10-08: 60 mg via ORAL
  Filled 2017-10-08: qty 3

## 2017-10-08 NOTE — ED Provider Notes (Signed)
[facility]-EMERGENCY DEPT Provider Note   CSN: [id number] Arrival date & time: 10/08/17  1754     History   Chief Complaint Chief Complaint  Patient presents with  . Chest Pain    HPI  Steven [name] is a 26 y.o. Male with a history of asthma, and tobacco and marijuana use, presents complaining of wheezing, cough and right-sided chest pain for the past 2 days.  Patient localizes chest pain over his right lateral ribs, reports this is made worse with inspiration and expiration and when he coughs.  Patient reports he has had a productive cough for the past 2 days, with yellowish sputum, no hemoptysis.  Denies associated fevers or chills.  He reports associated wheezing and chest tightness for the past 2 days, has been using his albuterol inhaler with no improvement.  Patient reports he was seen for similar symptoms a week or 2 ago, and discharged with steroids but was unable to get these medications filled.  Patient reports he is running low on his albuterol inhaler, and is out of albuterol nebulizer solution. Denies lower extremity pain or swelling, recent travel or immobilization, history of PE or DVT, family or personal history of bleeding or clotting disorders, or hemoptysis.       Past Medical History:  Diagnosis Date  . Allergic rhinitis   . Bronchitis   . Hx of tobacco use, presenting hazards to health   . Marijuana abuse   . Severe persistent asthma     Patient Active Problem List   Diagnosis Date Noted  . Asthma exacerbation 05/07/2017  . Non compliance with medical treatment 12/12/2016  . Nausea with vomiting 12/03/2016  . Acute bronchitis 09/16/2012  . Status asthmaticus 09/15/2012  . Acute respiratory failure with hypoxia (HCC) 09/15/2012  . Sinus tachycardia 09/15/2012  . Severe persistent asthma   . Allergic rhinitis   . Marijuana abuse   . Hx of tobacco use, presenting hazards to health     Past Surgical History:  Procedure Laterality  Date  . TONSILLECTOMY  2008       Home Medications    Prior to Admission medications   Medication Sig Start Date End Date Taking? Authorizing Provider  albuterol (PROVENTIL HFA;VENTOLIN HFA) 108 (90 Base) MCG/ACT inhaler Inhale 2 puffs every 6 (six) hours as needed into the lungs for wheezing or shortness of breath.   Yes [provider]  albuterol (PROVENTIL) (2.5 MG/3ML) 0.083% nebulizer solution Take 3 mLs (2.5 mg total) by nebulization every 6 (six) hours as needed for wheezing. 05/08/17  Yes Lama, Sarina IllGagan S, MD  budesonide (PULMICORT) 0.25 MG/2ML nebulizer solution Take 2 mLs (0.25 mg total) by nebulization 2 (two) times daily. 05/08/17  Yes Sharl MaLama, Sarina IllGagan S, MD  predniSONE (DELTASONE) 10 MG tablet Prednisone 40 mg po daily x 1 day then Prednisone 30 mg po daily x 1 day then Prednisone 20 mg po daily x 1 day then Prednisone 10 mg daily x 1 day then stop... Patient not taking: Reported on 10/08/2017 05/08/17   Meredeth IdeLama, Gagan S, MD    Family History Family History  Problem Relation Age of Onset  . Asthma Mother   . Cancer Mother   . Asthma Brother     Social History Social History   Tobacco Use  . Smoking status: Former Smoker    Types: Cigarettes    Last attempt to quit: 11/22/2011    Years since quitting: 5.8  . Smokeless tobacco: Never Used  Substance Use  Topics  . Alcohol use: No    Comment: Occasional  . Drug use: Yes    Types: Marijuana    Comment: occasionally     Allergies   Patient has no known allergies.   Review of Systems Review of Systems  Constitutional: Negative for chills and fever.  HENT: Negative for congestion, postnasal drip, rhinorrhea and sore throat.   Eyes: Negative for visual disturbance.  Respiratory: Positive for cough, chest tightness, shortness of breath and wheezing.   Cardiovascular: Positive for chest pain.  Gastrointestinal: Negative for abdominal pain, diarrhea, nausea and vomiting.  Genitourinary: Negative for dysuria.    Musculoskeletal: Negative for arthralgias and myalgias.  Skin: Negative for rash.  Neurological: Negative for weakness, light-headedness and numbness.     Physical Exam Updated Vital Signs BP (!) 143/90 (BP Location: Left Arm)   Pulse (!) 109   Temp 98.8 F (37.1 C) (Oral)   Resp 20   SpO2 95%   Physical Exam  Constitutional: He appears well-developed and well-nourished. No distress.  HENT:  Head: Normocephalic and atraumatic.  Nose: Nose normal.  Mouth/Throat: Oropharynx is clear and moist. No oropharyngeal exudate.  Eyes: Right eye exhibits no discharge. Left eye exhibits no discharge.  Neck: Normal range of motion. Neck supple.  Cardiovascular: Normal rate, regular rhythm, normal heart sounds and intact distal pulses.  Pulmonary/Chest: Effort normal. No stridor. No respiratory distress. He has wheezes. He has no rales. He exhibits tenderness.  Normal respiratory effort, but there are diffuse expiratory wheezes with some decreased air movement bilaterally Tenderness to palpation over the right lateral ribs, repeatedly reproducible on exam, no appreciable step-off or crepitus  Abdominal: Soft. Bowel sounds are normal. He exhibits no distension and no mass. There is no tenderness. There is no guarding.  Musculoskeletal: He exhibits no edema or deformity.  Neurological: He is alert. Coordination normal.  Skin: Skin is warm and dry. Capillary refill takes less than 2 seconds. He is not diaphoretic.  Psychiatric: He has a normal mood and affect. His behavior is normal.  Nursing note and vitals reviewed.    ED Treatments / Results  Labs (all labs ordered are listed, but only abnormal results are displayed) Labs Reviewed  BASIC METABOLIC PANEL  CBC  I-STAT TROPONIN, ED    EKG  EKG Interpretation  Date/Time:  Sunday October 08 2017 18:05:47 EST Ventricular Rate:  106 PR Interval:    QRS Duration: 90 QT Interval:  334 QTC Calculation: 444 R Axis:   79 Text  Interpretation:  Sinus tachycardia No significant change was found Confirmed by Paula Libra ([id number]) on 10/09/2017 1:26:23 AM       Radiology Dg Chest 2 View  Result Date: 10/08/2017 CLINICAL DATA:  26 y/o  M; right pectoralis anterior chest pain. EXAM: CHEST  2 VIEW COMPARISON:  05/07/2017 chest radiograph FINDINGS: The heart size and mediastinal contours are within normal limits. Both lungs are clear. The visualized skeletal structures are unremarkable. IMPRESSION: No active cardiopulmonary disease. Electronically Signed   By: Mitzi Hansen M.D.   On: 10/08/2017 18:24    Procedures Procedures (including critical care time)  Medications Ordered in ED Medications  albuterol (PROVENTIL HFA;VENTOLIN HFA) 108 (90 Base) MCG/ACT inhaler 2 puff (not administered)  ipratropium (ATROVENT) nebulizer solution 0.5 mg (0.5 mg Nebulization Given 10/08/17 2301)  albuterol (PROVENTIL) (2.5 MG/3ML) 0.083% nebulizer solution 5 mg (5 mg Nebulization Given 10/08/17 2301)  predniSONE (DELTASONE) tablet 60 mg (60 mg Oral Given 10/08/17 2301)  ibuprofen (ADVIL,MOTRIN) tablet  600 mg (600 mg Oral Given 10/08/17 2301)  albuterol (PROVENTIL) (2.5 MG/3ML) 0.083% nebulizer solution 5 mg (5 mg Nebulization Given 10/09/17 0015)  ipratropium (ATROVENT) nebulizer solution 0.5 mg (0.5 mg Nebulization Given 10/09/17 0016)     Initial Impression / Assessment and Plan / ED Course  I have reviewed the triage vital signs and the nursing notes.  Pertinent labs & imaging results that were available during my care of the patient were reviewed by me and considered in my medical decision making (see chart for details).  Patient complaining of 2 days of cough, right-sided chest pain, wheezing and chest tightness. She is overall well-appearing, slightly hypertensive on initial evaluation but vitals otherwise normal. On exam patient has diffuse expiratory wheezes, and some decreased air movement. Pt is not hypoxic, no  evidence of respiratory distress. There is right-sided chest tenderness over the right lateral ribs that is repeatedly reproducible on palpation. Chest x-ray negative for infiltrate or rib fracture. EKG and labs are unremarkable, troponin negative. Presentation suggests asthma exacerbation, right-sided chest pain, likely musculoskeletal chest wall pain from chest tightness and cough. Low suspicion for PE given negative risk factors, PERC negative. We'll treat with albuterol and Atrovent, prednisone and ibuprofen for pain and reevaluate patient.   After first nebulizer treatment and steroids patient has better air movement throughout there are still expiratory wheezes present throughout. Patient reports he feels like his breathing is better. Right-sided chest pain is starting to ease with ibuprofen. Give another nebulizer treatment and reevaluate.   After second nebulizer, prednisone has had more time to work as well lungs are much improved on exam single faint expiratory wheeze heard over the right middle lobe, no other adventitious sounds. Right-sided chest pain has continued to improve. Patient is stable for discharge home with five-day course of prednisone. Patient provided with albuterol inhaler in the ED, as well as prescriptions for albuterol inhaler and nebulizer refills. Patient instructed to use albuterol treatment every 4 hours for the next 24 hours and then as needed. NSAID therapy for chest wall pain. Pt to follow up with [facility]. Return precautions discussed. At discharge patient is in no acute distress and has no concerns, agrees with plan.    Final Clinical Impressions(s) / ED Diagnoses   Final diagnoses:  Asthma with acute exacerbation, unspecified asthma severity, unspecified whether persistent  Chest wall pain    ED Discharge Orders        Ordered    predniSONE (DELTASONE) 20 MG tablet  Daily     10/09/17 0157    albuterol (PROVENTIL HFA;VENTOLIN  HFA) 108 (90 Base) MCG/ACT inhaler  Every 6 hours PRN     10/09/17 0157    albuterol (PROVENTIL) (2.5 MG/3ML) 0.083% nebulizer solution  Every 6 hours PRN     10/09/17 0157    ibuprofen (ADVIL,MOTRIN) 600 MG tablet  Every 6 hours PRN     10/09/17 0157       Dartha LodgeFord, Kelsey N, PA-C 10/09/17 0214    Dartha LodgeFord, Kelsey N, PA-C 10/09/17 0237    Paula LibraMolpus, John, MD 10/09/17 0530

## 2017-10-08 NOTE — ED Notes (Signed)
After being exceptionally rude to staff, pt and visitor ambulated out to lobby. He states, "this shit crazy." Pt informed that our doctor has reviewed his EKG and he is safe to wait in the lobby. Bloodwork has also been drawn and he was informed that we would be checking his results as they come.

## 2017-10-08 NOTE — ED Triage Notes (Addendum)
Pt complaining of R sided chest pain and cough since yesterday. He states that the pain worsens with inspiration and expiration. A&Ox4. Denies N/V/D. Ambulatory.

## 2017-10-09 MED ORDER — ALBUTEROL SULFATE HFA 108 (90 BASE) MCG/ACT IN AERS: 2.0000 | INHALATION_SPRAY | Freq: Four times a day (QID) | RESPIRATORY_TRACT | 0 refills | Status: DC | PRN

## 2017-10-09 MED ORDER — ALBUTEROL SULFATE (2.5 MG/3ML) 0.083% IN NEBU
5.0000 mg | INHALATION_SOLUTION | Freq: Once | RESPIRATORY_TRACT | Status: AC
  Administered 2017-10-09: 5 mg via RESPIRATORY_TRACT
  Filled 2017-10-09: qty 6

## 2017-10-09 MED ORDER — PREDNISONE 20 MG PO TABS: 40.0000 mg | ORAL_TABLET | Freq: Every day | ORAL | 0 refills | Status: AC

## 2017-10-09 MED ORDER — ALBUTEROL SULFATE (2.5 MG/3ML) 0.083% IN NEBU: 2.5000 mg | INHALATION_SOLUTION | Freq: Four times a day (QID) | RESPIRATORY_TRACT | 0 refills | Status: DC | PRN

## 2017-10-09 MED ORDER — IBUPROFEN 600 MG PO TABS: 600.0000 mg | ORAL_TABLET | Freq: Four times a day (QID) | ORAL | 0 refills | Status: DC | PRN

## 2017-10-09 MED ORDER — IPRATROPIUM BROMIDE 0.02 % IN SOLN
0.5000 mg | Freq: Once | RESPIRATORY_TRACT | Status: AC
  Administered 2017-10-09: 0.5 mg via RESPIRATORY_TRACT
  Filled 2017-10-09: qty 2.5

## 2017-10-09 MED ORDER — ALBUTEROL SULFATE HFA 108 (90 BASE) MCG/ACT IN AERS
2.0000 | INHALATION_SPRAY | Freq: Once | RESPIRATORY_TRACT | Status: AC
  Administered 2017-10-09: 2 via RESPIRATORY_TRACT
  Filled 2017-10-09: qty 6.7

## 2017-10-09 NOTE — Discharge Instructions (Addendum)
Use albuterol either 2 puffs with your inhaler or via a neb machine every 4 hr scheduled for 24hr then every 4 hr as needed. Take the steroid medicine as prescribed once daily for 4 more days. Follow up with your doctor in 2-3 days. Return sooner for Persistent wheezing, increased breathing difficulty, new concerns.  Ibuprofen every 6 hours as needed for right-sided chest pain, if this is worsening, or other new or concerning symptoms develop please return to the ED for sooner evaluation.  Please call and schedule an [facility]

## 2017-11-01 ENCOUNTER — Emergency Department (HOSPITAL_COMMUNITY): Admission: EM | Admit: 2017-11-01 | Discharge: 2017-11-02 | Discharge status: Against Medical Advice | Payer: Self-pay

## 2017-11-01 ENCOUNTER — Other Ambulatory Visit: Payer: Self-pay

## 2017-11-01 NOTE — ED Triage Notes (Signed)
Patient did not answer for triage x 2.

## 2018-05-07 ENCOUNTER — Encounter (HOSPITAL_COMMUNITY): Payer: Self-pay | Admitting: Emergency Medicine

## 2018-05-07 ENCOUNTER — Ambulatory Visit (HOSPITAL_COMMUNITY)
Admission: EM | Admit: 2018-05-07 | Discharge: 2018-05-07 | Disposition: A | Discharge status: Home / Self Care | Payer: Self-pay | Attending: Urgent Care | Admitting: Urgent Care

## 2018-05-07 DIAGNOSIS — Z7251 High risk heterosexual behavior: Secondary | ICD-10-CM

## 2018-05-07 DIAGNOSIS — Z7951 Long term (current) use of inhaled steroids: Secondary | ICD-10-CM | POA: Insufficient documentation

## 2018-05-07 DIAGNOSIS — Z79899 Other long term (current) drug therapy: Secondary | ICD-10-CM | POA: Insufficient documentation

## 2018-05-07 DIAGNOSIS — Z113 Encounter for screening for infections with a predominantly sexual mode of transmission: Secondary | ICD-10-CM | POA: Insufficient documentation

## 2018-05-07 NOTE — ED Provider Notes (Signed)
  MRN: [id number] DOB: 1991-07-06  Subjective:   Steven [name] is a 27 y.o. male presenting for an STI check. He does have unprotected sex. Denies fever, dysuria, hematuria, urinary frequency, penile discharge, penile swelling, testicular pain, testicular swelling, anal pain, groin pain.   No current facility-administered medications for this encounter.   Current Outpatient Medications:  .  albuterol (PROVENTIL HFA;VENTOLIN HFA) 108 (90 Base) MCG/ACT inhaler, Inhale 2 puffs every 6 (six) hours as needed into the lungs for wheezing or shortness of breath., Disp: 1 Inhaler, Rfl: 0 .  albuterol (PROVENTIL) (2.5 MG/3ML) 0.083% nebulizer solution, Take 3 mLs (2.5 mg total) every 6 (six) hours as needed by nebulization for wheezing., Disp: 75 mL, Rfl: 0 .  budesonide (PULMICORT) 0.25 MG/2ML nebulizer solution, Take 2 mLs (0.25 mg total) by nebulization 2 (two) times daily., Disp: 60 mL, Rfl: 12 .  ibuprofen (ADVIL,MOTRIN) 600 MG tablet, Take 1 tablet (600 mg total) every 6 (six) hours as needed by mouth., Disp: 30 tablet, Rfl: 0 .  predniSONE (DELTASONE) 10 MG tablet, Prednisone 40 mg po daily x 1 day then Prednisone 30 mg po daily x 1 day then Prednisone 20 mg po daily x 1 day then Prednisone 10 mg daily x 1 day then stop... (Patient not taking: Reported on 10/08/2017), Disp: 10 tablet, Rfl: 0    No Known Allergies   Past Medical History:  Diagnosis Date  . Allergic rhinitis   . Bronchitis   . Hx of tobacco use, presenting hazards to health   . Marijuana abuse   . Severe persistent asthma      Past Surgical History:  Procedure Laterality Date  . TONSILLECTOMY  2008    Objective:   Vitals: BP (!) 152/99 (BP Location: Left Arm)   Pulse 87   Temp 98.1 F (36.7 C) (Oral)   Resp 18   SpO2 99%   Physical Exam  Constitutional: He is oriented to person, place, and time. He appears well-developed and well-nourished.  Cardiovascular: Normal rate.  Pulmonary/Chest: Effort normal.   Neurological: He is alert and oriented to person, place, and time.    Assessment and Plan :   Unprotected sex  Routine screening for STI (sexually transmitted infection)  STI labs pending. Patient is asymptomatic, will hold off on empiric treatment.     Wallis BambergMani, Rainey Kahrs, PA-C 05/07/18 1318

## 2018-05-07 NOTE — ED Triage Notes (Signed)
Pt requesting STD screening  

## 2018-05-08 LAB — URINE CYTOLOGY ANCILLARY ONLY
CHLAMYDIA, DNA PROBE: NEGATIVE
NEISSERIA GONORRHEA: NEGATIVE
TRICH (WINDOWPATH): NEGATIVE

## 2018-05-08 LAB — HIV ANTIBODY (ROUTINE TESTING W REFLEX): HIV Screen 4th Generation wRfx: NONREACTIVE

## 2018-05-08 LAB — RPR: RPR Ser Ql: NONREACTIVE

## 2018-11-09 ENCOUNTER — Encounter (HOSPITAL_COMMUNITY): Payer: Self-pay | Admitting: Emergency Medicine

## 2019-01-23 IMAGING — CR DG CHEST 2V
2 series · 2 of 2 positions shown · non-contrast
Comparison: 05/07/2017 chest radiograph

CLINICAL DATA: 25 y/o  M; right pectoralis anterior chest pain.

EXAM:
CHEST  2 VIEW

[w chest pa]
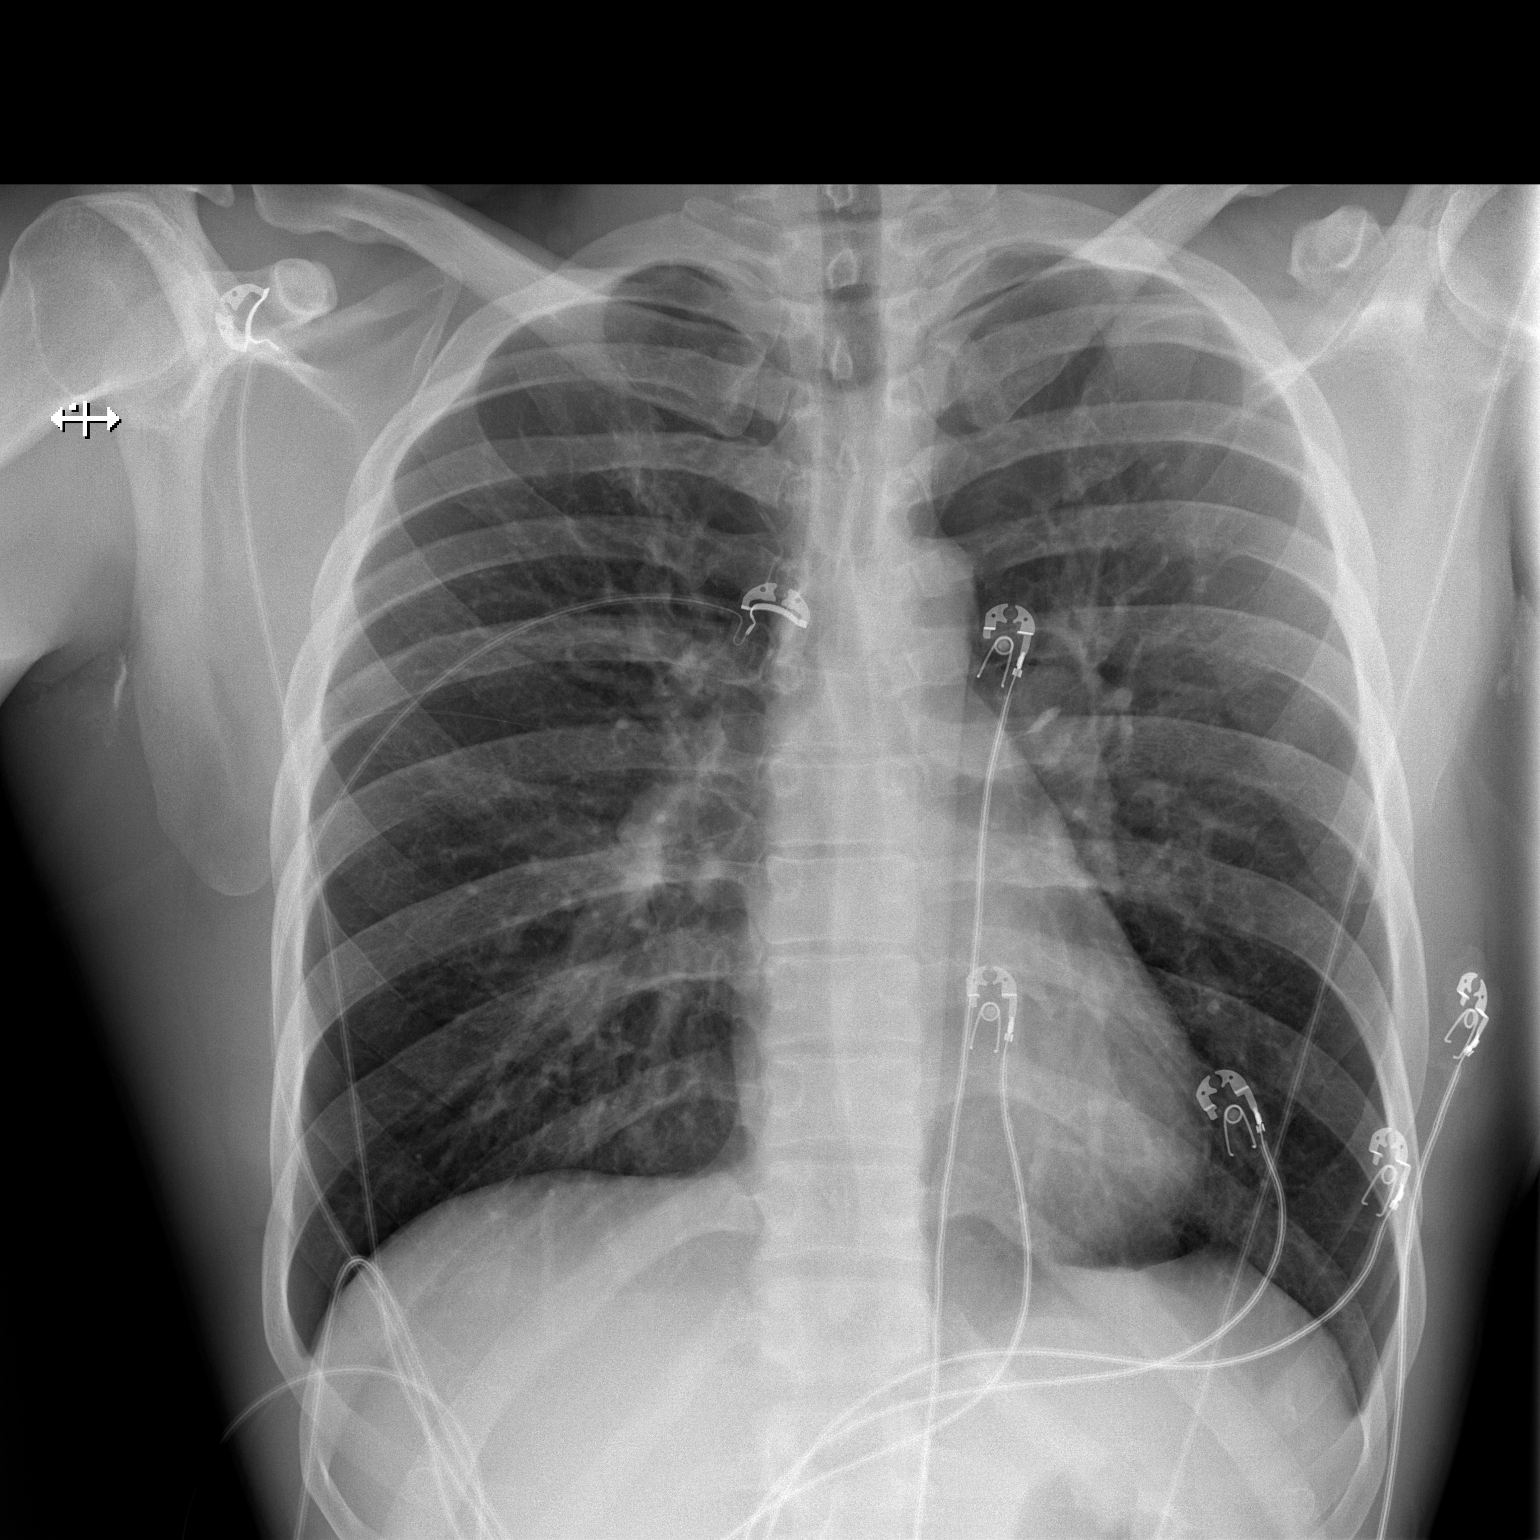

[w chest lat]
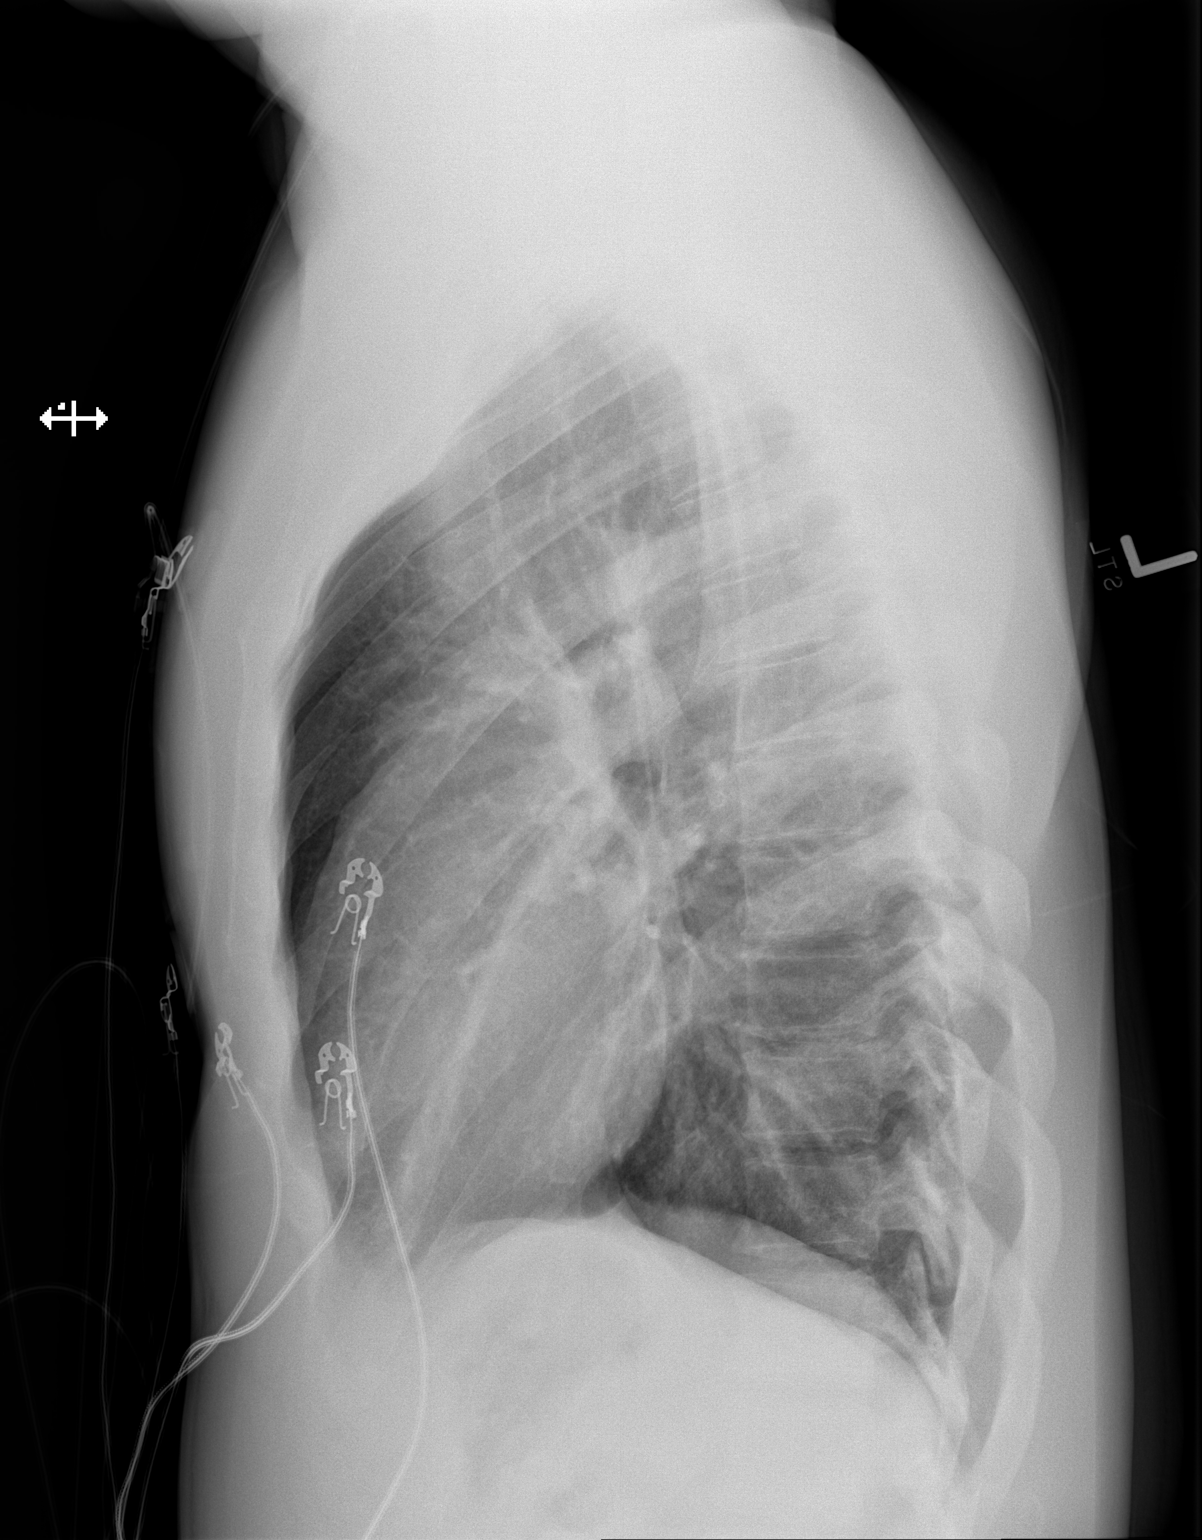

[2 of 2 positions shown; findings below may reference images not displayed]

FINDINGS: The heart size and mediastinal contours are within normal limits.
Both lungs are clear. The visualized skeletal structures are
unremarkable.
IMPRESSION: No active cardiopulmonary disease.

By: Botagoz Zh M.D.

## 2019-12-25 ENCOUNTER — Encounter (HOSPITAL_COMMUNITY): Payer: Self-pay | Admitting: Emergency Medicine

## 2019-12-25 ENCOUNTER — Other Ambulatory Visit: Payer: Self-pay

## 2019-12-25 ENCOUNTER — Ambulatory Visit (HOSPITAL_COMMUNITY)
Admission: EM | Admit: 2019-12-25 | Discharge: 2019-12-25 | Disposition: A | Discharge status: Home / Self Care | Payer: HRSA Program | Attending: Family Medicine | Admitting: Family Medicine

## 2019-12-25 DIAGNOSIS — R52 Pain, unspecified: Secondary | ICD-10-CM | POA: Diagnosis not present

## 2019-12-25 DIAGNOSIS — U071 COVID-19: Secondary | ICD-10-CM | POA: Diagnosis not present

## 2019-12-25 DIAGNOSIS — J4550 Severe persistent asthma, uncomplicated: Secondary | ICD-10-CM | POA: Insufficient documentation

## 2019-12-25 DIAGNOSIS — R509 Fever, unspecified: Secondary | ICD-10-CM | POA: Diagnosis present

## 2019-12-25 DIAGNOSIS — R0981 Nasal congestion: Secondary | ICD-10-CM | POA: Diagnosis not present

## 2019-12-25 DIAGNOSIS — R5383 Other fatigue: Secondary | ICD-10-CM | POA: Insufficient documentation

## 2019-12-25 MED ORDER — PSEUDOEPH-BROMPHEN-DM 30-2-10 MG/5ML PO SYRP: 5.0000 mL | ORAL_SOLUTION | Freq: Four times a day (QID) | ORAL | 0 refills | Status: DC | PRN

## 2019-12-25 NOTE — Discharge Instructions (Addendum)
If not allergic, you may use over the counter ibuprofen or acetaminophen as needed.  You have been tested for COVID-19 today. If your test returns positive, you will receive a phone call from [facility] regarding your results. Negative test results are not called. Both positive and negative results area always visible on MyChart. If you do not have a MyChart account, sign up instructions are provided in your discharge papers. Please do not hesitate to contact us should you have questions or concerns.  

## 2019-12-25 NOTE — ED Triage Notes (Signed)
Pt complains of body aches, chills, fatigue, and fever x3 days.  Pt states the highest fever at home was 100.7.  Pt reports taking Seroquel and 800 mg Ibuprofen before coming here today.

## 2019-12-26 NOTE — ED Provider Notes (Signed)
[facility] [facility]   [id number] 12/25/19 Arrival Time: 4818  ASSESSMENT & PLAN:  1. Fever and chills   2. Body aches     Suspicious for COVID. Discussed. COVID-19 testing sent. See letter/work note on file for self-isolation guidelines.  Meds ordered this encounter  Medications  . brompheniramine-pseudoephedrine-DM 30-2-10 MG/5ML syrup    Sig: Take 5 mLs by mouth 4 (four) times daily as needed.    Dispense:  120 mL    Refill:  0     Follow-up Information    [facility].   Specialty: Urgent Care Why: If worsening or failing to improve as anticipated. Contact information: [place] [phone]          Reviewed expectations re: course of current medical issues. Questions answered. Outlined signs and symptoms indicating need for more acute intervention. Patient verbalized understanding. After Visit Summary given.   SUBJECTIVE: History from: patient. Steven [name] is a 29 y.o. male who reports fairly abrupt onset of fever/chills, fatigue, nasal congestion for the past 3 days. Known COVID-19 contact: friend. Recent travel: none. Denies: sore throat, difficulty breathing and headache. Normal PO intake without n/v/d. Ibuprofen with some help.    OBJECTIVE:  Vitals:   12/25/19 1816  BP: 116/62  Pulse: 88  Resp: 18  Temp: 99 F (37.2 C)  TempSrc: Oral  SpO2: 98%    General appearance: alert; no distress; appears fatigued Eyes: PERRLA; EOMI; conjunctiva normal HENT: [place]; AT; nasal congestion Neck: supple  Lungs: speaks full sentences without difficulty; unlabored Extremities: no edema Skin: warm and dry Neurologic: normal gait Psychological: alert and cooperative; normal mood and affect  Labs:  Labs Reviewed  NOVEL CORONAVIRUS, NAA (HOSP ORDER, SEND-OUT TO REF LAB; TAT 18-24 HRS)     No Known Allergies  Past Medical History:  Diagnosis Date  . Allergic rhinitis   .  Asthma   . Bronchitis   . Hx of tobacco use, presenting hazards to health   . Marijuana abuse   . Severe persistent asthma    Social History   Socioeconomic History  . Marital status: Single    Spouse name: Not on file  . Number of children: Not on file  . Years of education: Not on file  . Highest education level: Not on file  Occupational History  . Not on file  Tobacco Use  . Smoking status: Never Smoker  . Smokeless tobacco: Never Used  Substance and Sexual Activity  . Alcohol use: No    Comment: Occasional  . Drug use: Yes    Types: Marijuana    Comment: occasionally  . Sexual activity: Not on file  Other Topics Concern  . Not on file  Social History Narrative   ** Merged History Encounter **       Social Determinants of Health   Financial Resource Strain:   . Difficulty of Paying Living Expenses: Not on file  Food Insecurity:   . Worried About Charity fundraiser in the Last Year: Not on file  . Ran Out of Food in the Last Year: Not on file  Transportation Needs:   . Lack of Transportation (Medical): Not on file  . Lack of Transportation (Non-Medical): Not on file  Physical Activity:   . Days of Exercise per Week: Not on file  . Minutes of Exercise per Session: Not on file  Stress:   . Feeling of Stress : Not on file  Social Connections:   . Frequency of Communication with Friends and Family: Not on file  . Frequency of Social Gatherings with Friends and Family: Not on file  . Attends Religious Services: Not on file  . Active Member of Clubs or Organizations: Not on file  . Attends Banker Meetings: Not on file  . Marital Status: Not on file  Intimate Partner Violence:   . Fear of Current or Ex-Partner: Not on file  . Emotionally Abused: Not on file  . Physically Abused: Not on file  . Sexually Abused: Not on file   Family History  Problem Relation Age of Onset  . Asthma Mother   . Cancer Mother   . Asthma Brother    Past Surgical  History:  Procedure Laterality Date  . TONSILLECTOMY  2008     Mardella Layman, MD 12/26/19 [phone]

## 2019-12-27 LAB — NOVEL CORONAVIRUS, NAA (HOSP ORDER, SEND-OUT TO REF LAB; TAT 18-24 HRS): SARS-CoV-2, NAA: DETECTED — AB

## 2019-12-30 ENCOUNTER — Emergency Department (HOSPITAL_COMMUNITY): Payer: HRSA Program

## 2019-12-30 ENCOUNTER — Other Ambulatory Visit: Payer: Self-pay

## 2019-12-30 ENCOUNTER — Ambulatory Visit: Payer: Self-pay

## 2019-12-30 ENCOUNTER — Emergency Department (HOSPITAL_COMMUNITY)
Admission: EM | Admit: 2019-12-30 | Discharge: 2019-12-30 | Disposition: A | Discharge status: Home / Self Care | Payer: HRSA Program | Attending: Emergency Medicine | Admitting: Emergency Medicine

## 2019-12-30 ENCOUNTER — Encounter (HOSPITAL_COMMUNITY): Payer: Self-pay | Admitting: Emergency Medicine

## 2019-12-30 DIAGNOSIS — J45909 Unspecified asthma, uncomplicated: Secondary | ICD-10-CM | POA: Diagnosis not present

## 2019-12-30 DIAGNOSIS — R059 Cough, unspecified: Secondary | ICD-10-CM

## 2019-12-30 DIAGNOSIS — R0789 Other chest pain: Secondary | ICD-10-CM | POA: Insufficient documentation

## 2019-12-30 DIAGNOSIS — R05 Cough: Secondary | ICD-10-CM

## 2019-12-30 DIAGNOSIS — U071 COVID-19: Secondary | ICD-10-CM | POA: Diagnosis not present

## 2019-12-30 MED ORDER — BENZONATATE 100 MG PO CAPS: 100.0000 mg | ORAL_CAPSULE | Freq: Three times a day (TID) | ORAL | 0 refills | Status: DC

## 2019-12-30 MED ORDER — ALBUTEROL SULFATE HFA 108 (90 BASE) MCG/ACT IN AERS: 1.0000 | INHALATION_SPRAY | Freq: Four times a day (QID) | RESPIRATORY_TRACT | 0 refills | Status: DC | PRN

## 2019-12-30 MED ORDER — ALBUTEROL SULFATE HFA 108 (90 BASE) MCG/ACT IN AERS
4.0000 | INHALATION_SPRAY | Freq: Once | RESPIRATORY_TRACT | Status: AC
  Administered 2019-12-30: 11:00:00 4 via RESPIRATORY_TRACT
  Filled 2019-12-30: qty 6.7

## 2019-12-30 MED ORDER — ALBUTEROL SULFATE (2.5 MG/3ML) 0.083% IN NEBU: 2.5000 mg | INHALATION_SOLUTION | Freq: Four times a day (QID) | RESPIRATORY_TRACT | 12 refills | Status: DC | PRN

## 2019-12-30 MED ORDER — AEROCHAMBER PLUS FLO-VU LARGE MISC: 1.0000 | Freq: Once | Status: DC

## 2019-12-30 NOTE — Discharge Instructions (Addendum)
As discussed, your symptoms are most likely related to COVID infection. I have filled your prescription for your albuterol and nebulizer treatments. I am also sending you home with cough medication. Take as prescribed. Continue to self quarantine 10 days since your symptoms started. You must be fever free for 3 days before your quarantine can end. I have included the number for [facility]. I recommend calling today to schedule an appointment to establish care. Return to the ER for new or worsening symptoms.

## 2019-12-30 NOTE — ED Triage Notes (Signed)
Pt tested covid + 4 days ago. Endorses pain with deep breaths and coughing since last night. Denies SOB, N/V.

## 2019-12-30 NOTE — ED Notes (Signed)
PT maintained Oxygen btw 94-96% on room air while ambulating

## 2019-12-30 NOTE — ED Provider Notes (Signed)
[facility] EMERGENCY DEPARTMENT Provider Note   CSN: [id number] Arrival date & time: 12/30/19  1021     History Chief Complaint  Patient presents with  . covid +/cough    Steven [name] is a 29 y.o. male with a past medical history significant for severe persistent asthma, tobacco usage, and marijuana usage who presents to the ED due to pleuritic chest pain last night associated with cough.  Chart reviewed.  Patient was seen on 12/25/2019 and diagnosed with COVID.  He states he started to experience a sharp left-sided chest pain with deep inspiration last night.  He notes he has a history of asthma and tried his nebulizer at home with improvement of his chest pain.  Patient also admits to an intermittent dry cough associated with shortness of breath with exertion.  Patient denies fever and chills.  Patient denies abdominal pain, nausea, vomiting, diarrhea, and urinary symptoms.  Patient has not tried anything for symptoms except his nebulizer treatments.  Patient denies lower extremity edema, recent surgeries, recent long immobilizations, history of blood clots, and hormonal treatments. Patient is currently chest pain free.     Past Medical History:  Diagnosis Date  . Allergic rhinitis   . Asthma   . Bronchitis   . Hx of tobacco use, presenting hazards to health   . Marijuana abuse   . Severe persistent asthma     Patient Active Problem List   Diagnosis Date Noted  . Asthma exacerbation 05/07/2017  . Non compliance with medical treatment 12/12/2016  . Nausea with vomiting 12/03/2016  . Acute bronchitis 09/16/2012  . Status asthmaticus 09/15/2012  . Acute respiratory failure with hypoxia (HCC) 09/15/2012  . Sinus tachycardia 09/15/2012  . Severe persistent asthma   . Allergic rhinitis   . Marijuana abuse   . Hx of tobacco use, presenting hazards to health     Past Surgical History:  Procedure Laterality Date  . TONSILLECTOMY  2008       Family History    Problem Relation Age of Onset  . Asthma Mother   . Cancer Mother   . Asthma Brother     Social History   Tobacco Use  . Smoking status: Never Smoker  . Smokeless tobacco: Never Used  Substance Use Topics  . Alcohol use: No    Comment: Occasional  . Drug use: Yes    Types: Marijuana    Comment: occasionally    Home Medications Prior to Admission medications   Medication Sig Start Date End Date Taking? Authorizing Provider  albuterol (PROVENTIL HFA;VENTOLIN HFA) 108 (90 Base) MCG/ACT inhaler Inhale 2 puffs into the lungs every 4 (four) hours as needed for wheezing or shortness of breath. 09/23/17   Gilda Crease, MD  albuterol (PROVENTIL HFA;VENTOLIN HFA) 108 (90 Base) MCG/ACT inhaler Inhale 2 puffs every 6 (six) hours as needed into the lungs for wheezing or shortness of breath. 10/09/17   Dartha Lodge, PA-C  albuterol (PROVENTIL) (2.5 MG/3ML) 0.083% nebulizer solution Take 3 mLs (2.5 mg total) every 6 (six) hours as needed by nebulization for wheezing. 10/09/17   Dartha Lodge, PA-C  albuterol (PROVENTIL) (2.5 MG/3ML) 0.083% nebulizer solution Take 3 mLs (2.5 mg total) by nebulization every 6 (six) hours as needed for wheezing or shortness of breath. 12/30/19   Mannie Stabile, PA-C  albuterol (VENTOLIN HFA) 108 (90 Base) MCG/ACT inhaler Inhale 1-2 puffs into the lungs every 6 (six) hours as needed for wheezing or shortness of breath.  12/30/19   Mannie Stabile, PA-C  benzonatate (TESSALON) 100 MG capsule Take 1 capsule (100 mg total) by mouth every 8 (eight) hours. 12/30/19   Mannie Stabile, PA-C  brompheniramine-pseudoephedrine-DM 30-2-10 MG/5ML syrup Take 5 mLs by mouth 4 (four) times [name] as needed. 12/25/19   Mardella Layman, MD  budesonide (PULMICORT) 0.25 MG/2ML nebulizer solution Take 2 mLs (0.25 mg total) by nebulization 2 (two) times [name]. 05/08/17   Meredeth Ide, MD  ibuprofen (ADVIL,MOTRIN) 600 MG tablet Take 1 tablet (600 mg total) every 6 (six) hours as  needed by mouth. 10/09/17   Dartha Lodge, PA-C  cetirizine (ZYRTEC) 10 MG tablet Take 1 tablet (10 mg total) by mouth [name]. Patient not taking: Reported on 09/23/2017 01/17/16 12/25/19  Cheri Fowler, PA-C  promethazine (PHENERGAN) 25 MG tablet Take 1 tablet (25 mg total) by mouth every 6 (six) hours as needed for nausea or vomiting. 09/23/17 12/25/19  Gilda Crease, MD    Allergies    Patient has no known allergies.  Review of Systems   Review of Systems  Constitutional: Negative for chills and fever.  Respiratory: Positive for cough and shortness of breath.   Cardiovascular: Positive for chest pain.  Gastrointestinal: Negative for abdominal pain, diarrhea, nausea and vomiting.  Genitourinary: Negative for dysuria.  All other systems reviewed and are negative.   Physical Exam Updated Vital Signs BP 99/66   Pulse 87   Temp 98.4 F (36.9 C) (Oral)   Resp 17   Ht 5\' 4"  (1.626 m)   Wt 77.1 kg   SpO2 96%   BMI 29.18 kg/m   Physical Exam Vitals and nursing note reviewed.  Constitutional:      General: He is not in acute distress.    Appearance: He is not ill-appearing.  HENT:     Head: Normocephalic.  Eyes:     Pupils: Pupils are equal, round, and reactive to light.  Cardiovascular:     Rate and Rhythm: Normal rate and regular rhythm.     Pulses: Normal pulses.     Heart sounds: Normal heart sounds. No murmur. No friction rub. No gallop.   Pulmonary:     Effort: Pulmonary effort is normal.     Breath sounds: Normal breath sounds.     Comments: Respirations equal and unlabored, patient able to speak in full sentences, lungs clear to auscultation bilaterally with decreased air movement at the base of the lungs. No wheeze.   Abdominal:     General: Abdomen is flat. There is no distension.     Palpations: Abdomen is soft.     Tenderness: There is no abdominal tenderness. There is no guarding or rebound.  Musculoskeletal:     Cervical back: Neck supple.     Comments:  Able to move all 4 extremities without difficulty.  No lower extremity edema.  Negative Homans sign bilaterally.  Skin:    General: Skin is warm and dry.  Neurological:     General: No focal deficit present.     Mental Status: He is alert.  Psychiatric:        Mood and Affect: Mood normal.        Behavior: Behavior normal.     ED Results / Procedures / Treatments   Labs (all labs ordered are listed, but only abnormal results are displayed) Labs Reviewed - No data to display  EKG EKG Interpretation  Date/Time:  Monday December 30 2019 11:08:56 EST Ventricular Rate:  86  PR Interval:    QRS Duration: 83 QT Interval:  359 QTC Calculation: 430 R Axis:   79 Text Interpretation: Sinus rhythm No acute changes Confirmed by Kristine Royal [phone]) on 12/30/2019 11:10:53 AM   Radiology DG Chest Portable 1 View  Result Date: 12/30/2019 CLINICAL DATA:  29 year old male tested positive for COVID-19. 4 days ago. Back pain and shortness of breath. EXAM: PORTABLE CHEST 1 VIEW COMPARISON:  Chest radiographs 10/08/2017 and earlier. FINDINGS: Portable AP upright view at 1106 hours. Lung volumes and mediastinal contours remain within normal limits. Visualized tracheal air column is within normal limits. Lung markings appear stable. Allowing for portable technique the lungs are clear. No pneumothorax or pleural effusion. Negative visible bowel gas pattern and osseous structures. IMPRESSION: Stable since 2018.  Negative portable chest. Electronically Signed   By: Odessa Fleming M.D.   On: 12/30/2019 11:26    Procedures Procedures (including critical care time)  Medications Ordered in ED Medications  AeroChamber Plus Flo-Vu Medium MISC 1 each (has no administration in time range)  albuterol (VENTOLIN HFA) 108 (90 Base) MCG/ACT inhaler 4 puff (4 puffs Inhalation Given 12/30/19 1115)    ED Course  I have reviewed the triage vital signs and the nursing notes.  Pertinent labs & imaging results that were  available during my care of the patient were reviewed by me and considered in my medical decision making (see chart for details).    MDM Rules/Calculators/A&P                     29 year old male presents to the ED due to pleuritic left-sided chest pain that started last night associated with shortness of breath with exertion and dry cough.  Patient tested positive for Covid on 12/25/2019.  Patient denies lower extremity edema, history of blood clots, recent surgeries, recent long immobilizations, and hormonal treatments.  Vitals all within normal limits.  Patient is afebrile, not tachycardic or hypoxic.  Patient in no acute distress and non-ill-appearing.  Physical exam reassuring.  Lungs clear to auscultation bilaterally with decreased air movement at base of the lungs. No lower extremity edema. Negative homans sign bilaterally. Will obtain EKG and CXR. Chest pain sounds atypical in nature. Patient has no risk factors except for tobacco usage for ACS.   EKG personally reviewed which demonstrates sinus rhythm with no signs of ischemia. Doubt ACS.  Chest x-ray personally reviewed which is negative for signs of pneumonia, pneumothorax, or widened mediastinum. Suspect symptoms related to COVID infection. Patient able to ambulate here in the ED and maintain O2 saturation between 94-96% without difficulty. Patient is not tachycardic or hypoxic, very low suspicion for PE. No signs of PE on physical exam. Patient has not had chest pain since last night. Breathing treatment given here in the ED with improvement in symptoms. No wheeze heard on exam. Doubt asthma exacerbation.Discussed case with Dr. Rodena Medin who agrees with assessment and plan. Advised patient to follow-up with PCP if symptoms do not improve within the next week. [facility] number given to patient at discharge. Will discharge patient with albuterol and nebulizer solution refill and cough medication. COVID quarantine guidelines discussed with patient.  Instructed patient to return to the ER for worsening chest pain, shortness of breath, or worsening symptoms. Strict ED precautions discussed with patient. Patient states understanding and agrees to plan. Patient discharged home in no acute distress and stable vitals   Steven [name] was evaluated in Emergency Department on 12/30/2019 for the symptoms described in  the history of present illness. He was evaluated in the context of the global COVID-19 pandemic, which necessitated consideration that the patient might be at risk for infection with the SARS-CoV-2 virus that causes COVID-19. Institutional protocols and algorithms that pertain to the evaluation of patients at risk for COVID-19 are in a state of rapid change based on information released by regulatory bodies including the CDC and federal and state organizations. These policies and algorithms were followed during the patient's care in the ED.  Final Clinical Impression(s) / ED Diagnoses Final diagnoses:  COVID-19 virus infection  Atypical chest pain  Cough    Rx / DC Orders ED Discharge Orders         Ordered    albuterol (VENTOLIN HFA) 108 (90 Base) MCG/ACT inhaler  Every 6 hours PRN     12/30/19 1154    benzonatate (TESSALON) 100 MG capsule  Every 8 hours     12/30/19 1154    albuterol (PROVENTIL) (2.5 MG/3ML) 0.083% nebulizer solution  Every 6 hours PRN     12/30/19 1154           Suzy Bouchard, PA-C 12/30/19 1159    Valarie Merino, MD 01/03/20 1417

## 2019-12-30 NOTE — Telephone Encounter (Addendum)
Patient called stating that he was Dx with COVID-19 4 days ago. He now has pain when taking a deep breath. He states that he has fever and cough. He was Dx at [facility]. He is using a nebulizer to help his breathing. He had fever of 101 but now 98 after treatment with Ibuprofen. Per protocol patient will go to ER for evaluation. Intake nurse Magie was notified of patient pending arrival. Care advice read to patient He  Verbalized understanding and will have someone drive him to ER. He refused need for EMS.  Reason for Disposition . Chest pain or pressure  Answer Assessment - Initial Assessment Questions 1. COVID-19 DIAGNOSIS: "Who made your Coronavirus (COVID-19) diagnosis?" "Was it confirmed by a positive lab test?" If not diagnosed by a HCP, ask "Are there lots of cases (community spread) where you live?" (See public health department website, if unsure)     Yes on 4 days ago 2. COVID-19 EXPOSURE: "Was there any known exposure to COVID before the symptoms began?" CDC Definition of close contact: within 6 feet (2 meters) for a total of 15 minutes or more over a 24-hour period.     N/A positive 3. ONSET: "When did the COVID-19 symptoms start?"     Last night  4. WORST SYMPTOM: "What is your worst symptom?" (e.g., cough, fever, shortness of breath, muscle aches)   SOB and chest pain 5. COUGH: "Do you have a cough?" If so, ask: "How bad is the cough?"      ocasional 6. FEVER: "Do you have a fever?" If so, ask: "What is your temperature, how was it measured, and when did it start?"    Temp 101 now 98 7. RESPIRATORY STATUS: "Describe your breathing?" (e.g., shortness of breath, wheezing, unable to speak)      SOB, using nebulizer 8. BETTER-SAME-WORSE: "Are you getting better, staying the same or getting worse compared to yesterday?"  If getting worse, ask, "In what way?"    worse 9. HIGH RISK DISEASE: "Do you have any chronic medical problems?" (e.g., asthma, heart or lung disease, weak immune system,  obesity, etc.)    asthma 10. PREGNANCY: "Is there any chance you are pregnant?" "When was your last menstrual period?"       N/A 11. OTHER SYMPTOMS: "Do you have any other symptoms?"  (e.g., chills, fatigue, headache, loss of smell or taste, muscle pain, sore throat; new loss of smell or taste especially support the diagnosis of COVID-19)      Headache, muscle pain body aches back ache  Protocols used: CORONAVIRUS (COVID-19) DIAGNOSED OR SUSPECTED-A-AH

## 2019-12-30 NOTE — ED Notes (Signed)
Patient verbalizes understanding of discharge instructions. Opportunity for questioning and answers were provided. Armband removed by staff, pt discharged from ED.  

## 2020-08-09 ENCOUNTER — Other Ambulatory Visit: Payer: Self-pay

## 2020-08-09 ENCOUNTER — Encounter (HOSPITAL_COMMUNITY): Payer: Self-pay

## 2020-08-09 ENCOUNTER — Ambulatory Visit (HOSPITAL_COMMUNITY)
Admission: EM | Admit: 2020-08-09 | Discharge: 2020-08-09 | Disposition: A | Discharge status: Home / Self Care | Payer: Self-pay | Attending: Family Medicine | Admitting: Family Medicine

## 2020-08-09 DIAGNOSIS — H1033 Unspecified acute conjunctivitis, bilateral: Secondary | ICD-10-CM

## 2020-08-09 MED ORDER — CIPROFLOXACIN HCL 0.3 % OP OINT: TOPICAL_OINTMENT | OPHTHALMIC | 0 refills | Status: DC

## 2020-08-09 MED ORDER — DEXAMETHASONE SODIUM PHOSPHATE 10 MG/ML IJ SOLN
INTRAMUSCULAR | Status: AC
  Filled 2020-08-09: qty 1

## 2020-08-09 MED ORDER — CETIRIZINE HCL 10 MG PO TABS: 10.0000 mg | ORAL_TABLET | Freq: Every day | ORAL | 0 refills | Status: DC

## 2020-08-09 MED ORDER — DEXAMETHASONE SODIUM PHOSPHATE 10 MG/ML IJ SOLN
10.0000 mg | Freq: Once | INTRAMUSCULAR | Status: AC
  Administered 2020-08-09: 10 mg via INTRAMUSCULAR

## 2020-08-09 NOTE — ED Triage Notes (Addendum)
Pt present eye pain in both eyes. Pt eyes are watery and red. Symptom started last night.

## 2020-08-09 NOTE — Discharge Instructions (Signed)
Apply medication to lower eyelids at bedtime every day over the next 7 days.  If symptoms worsen or do not improve return for evaluation.  Also start taking a Zyrtec to help with the eyelid swelling.  Medications are available at pharmacy.

## 2020-08-09 NOTE — ED Provider Notes (Signed)
[facility]    CSN: [id number] Arrival date & time: 08/09/20  1630      History   Chief Complaint Chief Complaint  Patient presents with  . Eye Pain    HPI Steven [name] is a 29 y.o. male.   HPI  Patient presents with with bilateral eye drainage and irritation since yesterday.  Patient reports going out of town and awaking this morning with bilateral eye irritation in drainage.  He took a shower and like I was with warm washcloth and symptoms have continued throughout the day.  He did apply some eyedrops uncertain of what type they were which did not resolve symptoms.  Denies a sensation of foreign body.  No previous issues related to eye redness or eye pain.  Past Medical History:  Diagnosis Date  . Allergic rhinitis   . Asthma   . Bronchitis   . Hx of tobacco use, presenting hazards to health   . Marijuana abuse   . Severe persistent asthma     Patient Active Problem List   Diagnosis Date Noted  . Asthma exacerbation 05/07/2017  . Non compliance with medical treatment 12/12/2016  . Nausea with vomiting 12/03/2016  . Acute bronchitis 09/16/2012  . Status asthmaticus 09/15/2012  . Acute respiratory failure with hypoxia (HCC) 09/15/2012  . Sinus tachycardia 09/15/2012  . Severe persistent asthma   . Allergic rhinitis   . Marijuana abuse   . Hx of tobacco use, presenting hazards to health     Past Surgical History:  Procedure Laterality Date  . TONSILLECTOMY  2008       Home Medications    Prior to Admission medications   Medication Sig Start Date End Date Taking? Authorizing Provider  albuterol (PROVENTIL HFA;VENTOLIN HFA) 108 (90 Base) MCG/ACT inhaler Inhale 2 puffs into the lungs every 4 (four) hours as needed for wheezing or shortness of breath. 09/23/17   Gilda Crease, MD  albuterol (PROVENTIL HFA;VENTOLIN HFA) 108 (90 Base) MCG/ACT inhaler Inhale 2 puffs every 6 (six) hours as needed into the lungs for wheezing or shortness of breath.  10/09/17   Dartha Lodge, PA-C  albuterol (PROVENTIL) (2.5 MG/3ML) 0.083% nebulizer solution Take 3 mLs (2.5 mg total) every 6 (six) hours as needed by nebulization for wheezing. 10/09/17   Dartha Lodge, PA-C  albuterol (PROVENTIL) (2.5 MG/3ML) 0.083% nebulizer solution Take 3 mLs (2.5 mg total) by nebulization every 6 (six) hours as needed for wheezing or shortness of breath. 12/30/19   Mannie Stabile, PA-C  albuterol (VENTOLIN HFA) 108 (90 Base) MCG/ACT inhaler Inhale 1-2 puffs into the lungs every 6 (six) hours as needed for wheezing or shortness of breath. 12/30/19   Mannie Stabile, PA-C  benzonatate (TESSALON) 100 MG capsule Take 1 capsule (100 mg total) by mouth every 8 (eight) hours. 12/30/19   Mannie Stabile, PA-C  brompheniramine-pseudoephedrine-DM 30-2-10 MG/5ML syrup Take 5 mLs by mouth 4 (four) times daily as needed. 12/25/19   Mardella Layman, MD  budesonide (PULMICORT) 0.25 MG/2ML nebulizer solution Take 2 mLs (0.25 mg total) by nebulization 2 (two) times daily. 05/08/17   Meredeth Ide, MD  cetirizine (ZYRTEC) 10 MG tablet Take 1 tablet (10 mg total) by mouth daily. 08/09/20   Bing Neighbors, FNP  ciprofloxacin (CILOXAN) 0.3 % ophthalmic ointment Apply 1 ribbon of ointment to the lower lid of your eyes at bedtime for 7 days 08/09/20   Bing Neighbors, FNP  ibuprofen (ADVIL,MOTRIN) 600 MG tablet  Take 1 tablet (600 mg total) every 6 (six) hours as needed by mouth. 10/09/17   Dartha Lodge, PA-C  promethazine (PHENERGAN) 25 MG tablet Take 1 tablet (25 mg total) by mouth every 6 (six) hours as needed for nausea or vomiting. 09/23/17 12/25/19  Gilda Crease, MD    Family History Family History  Problem Relation Age of Onset  . Asthma Mother   . Cancer Mother   . Asthma Brother     Social History Social History   Tobacco Use  . Smoking status: Never Smoker  . Smokeless tobacco: Never Used  Substance Use Topics  . Alcohol use: No    Comment: Occasional  . Drug  use: Yes    Types: Marijuana    Comment: occasionally     Allergies   Patient has no known allergies.   Review of Systems Review of Systems Pertinent negatives listed in HPI Physical Exam Triage Vital Signs ED Triage Vitals  Enc Vitals Group     BP 08/09/20 1653 (!) 141/90     Pulse Rate 08/09/20 1653 79     Resp --      Temp 08/09/20 1653 98.2 F (36.8 C)     Temp Source 08/09/20 1653 Oral     SpO2 08/09/20 1653 100 %     Weight --      Height --      Head Circumference --      Peak Flow --      Pain Score 08/09/20 1654 10     Pain Loc --      Pain Edu? --      Excl. in GC? --    No data found.  Updated Vital Signs BP (!) 141/90 (BP Location: Right Arm)   Pulse 79   Temp 98.2 F (36.8 C) (Oral)   SpO2 100%   Visual Acuity Right Eye Distance: 20/25 Left Eye Distance: 20/20 Bilateral Distance: 20/20  Right Eye Near:   Left Eye Near:    Bilateral Near:     Physical Exam Constitutional:      Appearance: He is not ill-appearing.  Eyes:     Conjunctiva/sclera:     Right eye: Right conjunctiva is injected.     Left eye: Left conjunctiva is injected.     Comments: Clear exudative drainage bilaterally  Cardiovascular:     Rate and Rhythm: Normal rate and regular rhythm.  Pulmonary:     Effort: Pulmonary effort is normal.     Breath sounds: Normal breath sounds and air entry.  Neurological:     Mental Status: He is alert.     UC Treatments / Results  Labs (all labs ordered are listed, but only abnormal results are displayed) Labs Reviewed - No data to display  EKG   Radiology No results found.  Procedures Procedures (including critical care time)  Medications Ordered in UC Medications  dexamethasone (DECADRON) injection 10 mg (has no administration in time range)    Initial Impression / Assessment and Plan / UC Course  I have reviewed the triage vital signs and the nursing notes.  Pertinent labs & imaging results that were available  during my care of the patient were reviewed by me and considered in my medical decision making (see chart for details).     Acute conjunctivitis, Ciprodex applied to lower eyelids once at bedtime x7 days.  Take Zyrtec for swelling.  Clear follow-up instructions provided if symptoms do not resolve or if they  worsen to go immediately to the ER or follow-up with primary care. Final Clinical Impressions(s) / UC Diagnoses   Final diagnoses:  Acute conjunctivitis of both eyes, unspecified acute conjunctivitis type     Discharge Instructions     Apply medication to lower eyelids at bedtime every day over the next 7 days.  If symptoms worsen or do not improve return for evaluation.  Also start taking a Zyrtec to help with the eyelid swelling.  Medications are available at pharmacy.   ED Prescriptions    Medication Sig Dispense Auth. Provider   ciprofloxacin (CILOXAN) 0.3 % ophthalmic ointment Apply 1 ribbon of ointment to the lower lid of your eyes at bedtime for 7 days 3.5 g Bing Neighbors, FNP   cetirizine (ZYRTEC) 10 MG tablet Take 1 tablet (10 mg total) by mouth daily. 30 tablet Bing Neighbors, FNP     PDMP not reviewed this encounter.   Bing Neighbors, [place] 08/13/20 2202

## 2020-10-27 ENCOUNTER — Ambulatory Visit (HOSPITAL_COMMUNITY)
Admission: EM | Admit: 2020-10-27 | Discharge: 2020-10-27 | Disposition: A | Discharge status: Home / Self Care | Payer: HRSA Program | Attending: Physician Assistant | Admitting: Physician Assistant

## 2020-10-27 ENCOUNTER — Other Ambulatory Visit: Payer: Self-pay

## 2020-10-27 ENCOUNTER — Encounter (HOSPITAL_COMMUNITY): Payer: Self-pay

## 2020-10-27 DIAGNOSIS — R0981 Nasal congestion: Secondary | ICD-10-CM | POA: Diagnosis not present

## 2020-10-27 DIAGNOSIS — Z20822 Contact with and (suspected) exposure to covid-19: Secondary | ICD-10-CM

## 2020-10-27 LAB — SARS CORONAVIRUS 2 (TAT 6-24 HRS): SARS Coronavirus 2: NEGATIVE

## 2020-10-27 NOTE — ED Provider Notes (Signed)
[facility]    CSN: [id number] Arrival date & time: 10/27/20  1011      History   Chief Complaint Chief Complaint  Patient presents with  . Covid Testing  . Nasal Congestion    HPI Steven [name] is a 29 y.o. male.   Patient here requesting COVID test.  He reports his friend was coughing and now he would like to be tested.  His friend did not get COVID tested.  Patient reports he feels well, denies any sx.  HE has not had COVID vaccine.     Past Medical History:  Diagnosis Date  . Allergic rhinitis   . Asthma   . Bronchitis   . Hx of tobacco use, presenting hazards to health   . Marijuana abuse   . Severe persistent asthma     Patient Active Problem List   Diagnosis Date Noted  . Asthma exacerbation 05/07/2017  . Non compliance with medical treatment 12/12/2016  . Nausea with vomiting 12/03/2016  . Acute bronchitis 09/16/2012  . Status asthmaticus 09/15/2012  . Acute respiratory failure with hypoxia (HCC) 09/15/2012  . Sinus tachycardia 09/15/2012  . Severe persistent asthma   . Allergic rhinitis   . Marijuana abuse   . Hx of tobacco use, presenting hazards to health     Past Surgical History:  Procedure Laterality Date  . TONSILLECTOMY  2008       Home Medications    Prior to Admission medications   Medication Sig Start Date End Date Taking? Authorizing Provider  albuterol (PROVENTIL HFA;VENTOLIN HFA) 108 (90 Base) MCG/ACT inhaler Inhale 2 puffs into the lungs every 4 (four) hours as needed for wheezing or shortness of breath. 09/23/17   Gilda Crease, MD  albuterol (PROVENTIL HFA;VENTOLIN HFA) 108 (90 Base) MCG/ACT inhaler Inhale 2 puffs every 6 (six) hours as needed into the lungs for wheezing or shortness of breath. 10/09/17   Dartha Lodge, PA-C  albuterol (PROVENTIL) (2.5 MG/3ML) 0.083% nebulizer solution Take 3 mLs (2.5 mg total) every 6 (six) hours as needed by nebulization for wheezing. 10/09/17   Dartha Lodge, PA-C    albuterol (PROVENTIL) (2.5 MG/3ML) 0.083% nebulizer solution Take 3 mLs (2.5 mg total) by nebulization every 6 (six) hours as needed for wheezing or shortness of breath. 12/30/19   Mannie Stabile, PA-C  albuterol (VENTOLIN HFA) 108 (90 Base) MCG/ACT inhaler Inhale 1-2 puffs into the lungs every 6 (six) hours as needed for wheezing or shortness of breath. 12/30/19   Mannie Stabile, PA-C  benzonatate (TESSALON) 100 MG capsule Take 1 capsule (100 mg total) by mouth every 8 (eight) hours. 12/30/19   Mannie Stabile, PA-C  brompheniramine-pseudoephedrine-DM 30-2-10 MG/5ML syrup Take 5 mLs by mouth 4 (four) times daily as needed. 12/25/19   Mardella Layman, MD  budesonide (PULMICORT) 0.25 MG/2ML nebulizer solution Take 2 mLs (0.25 mg total) by nebulization 2 (two) times daily. 05/08/17   Meredeth Ide, MD  cetirizine (ZYRTEC) 10 MG tablet Take 1 tablet (10 mg total) by mouth daily. 08/09/20   Bing Neighbors, FNP  ciprofloxacin (CILOXAN) 0.3 % ophthalmic ointment Apply 1 ribbon of ointment to the lower lid of your eyes at bedtime for 7 days 08/09/20   Bing Neighbors, FNP  ibuprofen (ADVIL,MOTRIN) 600 MG tablet Take 1 tablet (600 mg total) every 6 (six) hours as needed by mouth. 10/09/17   Dartha Lodge, PA-C  promethazine (PHENERGAN) 25 MG tablet Take 1 tablet (25 mg  total) by mouth every 6 (six) hours as needed for nausea or vomiting. 09/23/17 12/25/19  Gilda Crease, MD    Family History Family History  Problem Relation Age of Onset  . Asthma Mother   . Cancer Mother   . Asthma Brother     Social History Social History   Tobacco Use  . Smoking status: Never Smoker  . Smokeless tobacco: Never Used  Substance Use Topics  . Alcohol use: No    Comment: Occasional  . Drug use: Yes    Types: Marijuana    Comment: occasionally     Allergies   Patient has no known allergies.   Review of Systems Review of Systems  Constitutional: Negative for chills, fatigue and fever.   HENT: Negative for congestion, ear pain, nosebleeds, postnasal drip, rhinorrhea, sinus pressure, sinus pain and sore throat.   Eyes: Negative for pain and redness.  Respiratory: Negative for cough, shortness of breath and wheezing.   Gastrointestinal: Negative for abdominal pain, diarrhea, nausea and vomiting.  Musculoskeletal: Negative for arthralgias and myalgias.  Skin: Negative for rash.  Neurological: Negative for light-headedness and headaches.  Hematological: Negative for adenopathy. Does not bruise/bleed easily.  Psychiatric/Behavioral: Negative for confusion and sleep disturbance.     Physical Exam Triage Vital Signs ED Triage Vitals  Enc Vitals Group     BP 10/27/20 1207 (!) 155/97     Pulse Rate 10/27/20 1207 84     Resp 10/27/20 1207 18     Temp 10/27/20 1207 99.4 F (37.4 C)     Temp Source 10/27/20 1207 Oral     SpO2 10/27/20 1207 98 %     Weight --      Height --      Head Circumference --      Peak Flow --      Pain Score 10/27/20 1208 0     Pain Loc --      Pain Edu? --      Excl. in GC? --    No data found.  Updated Vital Signs BP (!) 155/97 (BP Location: Right Arm)   Pulse 84   Temp 99.4 F (37.4 C) (Oral)   Resp 18   SpO2 98%   Visual Acuity Right Eye Distance:   Left Eye Distance:   Bilateral Distance:    Right Eye Near:   Left Eye Near:    Bilateral Near:     Physical Exam Vitals and nursing note reviewed.  Constitutional:      General: He is not in acute distress.    Appearance: Normal appearance. He is not ill-appearing.  HENT:     Head: Normocephalic and atraumatic.     Right Ear: Tympanic membrane and ear canal normal.     Left Ear: Tympanic membrane and ear canal normal.     Nose: No congestion or rhinorrhea.     Mouth/Throat:     Pharynx: No oropharyngeal exudate or posterior oropharyngeal erythema.  Eyes:     General: No scleral icterus.    Extraocular Movements: Extraocular movements intact.     Conjunctiva/sclera:  Conjunctivae normal.  Cardiovascular:     Rate and Rhythm: Normal rate and regular rhythm.     Heart sounds: No murmur heard.   Pulmonary:     Effort: Pulmonary effort is normal. No respiratory distress.     Breath sounds: Normal breath sounds. No wheezing or rales.  Musculoskeletal:     Cervical back: Normal range of motion. No  rigidity.  Skin:    Capillary Refill: Capillary refill takes less than 2 seconds.     Coloration: Skin is not jaundiced.     Findings: No rash.  Neurological:     General: No focal deficit present.     Mental Status: He is alert and oriented to person, place, and time.     Motor: No weakness.     Gait: Gait normal.  Psychiatric:        Mood and Affect: Mood normal.        Behavior: Behavior normal.      UC Treatments / Results  Labs (all labs ordered are listed, but only abnormal results are displayed) Labs Reviewed  SARS CORONAVIRUS 2 (TAT 6-24 HRS)    EKG   Radiology No results found.  Procedures Procedures (including critical care time)  Medications Ordered in UC Medications - No data to display  Initial Impression / Assessment and Plan / UC Course  I have reviewed the triage vital signs and the nursing notes.  Pertinent labs & imaging results that were available during my care of the patient were reviewed by me and considered in my medical decision making (see chart for details).     Remain out of work pending COVID test results. Final Clinical Impressions(s) / UC Diagnoses   Final diagnoses:  Encounter for laboratory testing for COVID-19 virus     Discharge Instructions     Remain out of work pending test results      ED Prescriptions    None     PDMP not reviewed this encounter.   Evern Core, PA-C 10/27/20 1258

## 2020-10-27 NOTE — ED Triage Notes (Signed)
Pt presents for covid testing with no known symptoms.  Pt states he needs a work note and believes he may have had an exposure.

## 2020-10-27 NOTE — Discharge Instructions (Addendum)
Remain out of work pending test results

## 2020-10-27 NOTE — ED Notes (Signed)
Pt c/o congestion, and general ill-feeling. States he had COVID approx beginning of 2021. No vaccines.

## 2021-04-15 IMAGING — DX DG CHEST 1V PORT
1 series · 1 of 1 positions shown · non-contrast
Comparison: Chest radiographs 10/08/2017 and earlier.

CLINICAL DATA: 28-year-old male tested positive for [id number]. 4
days ago. Back pain and shortness of breath.

EXAM:
PORTABLE CHEST 1 VIEW

[chest]
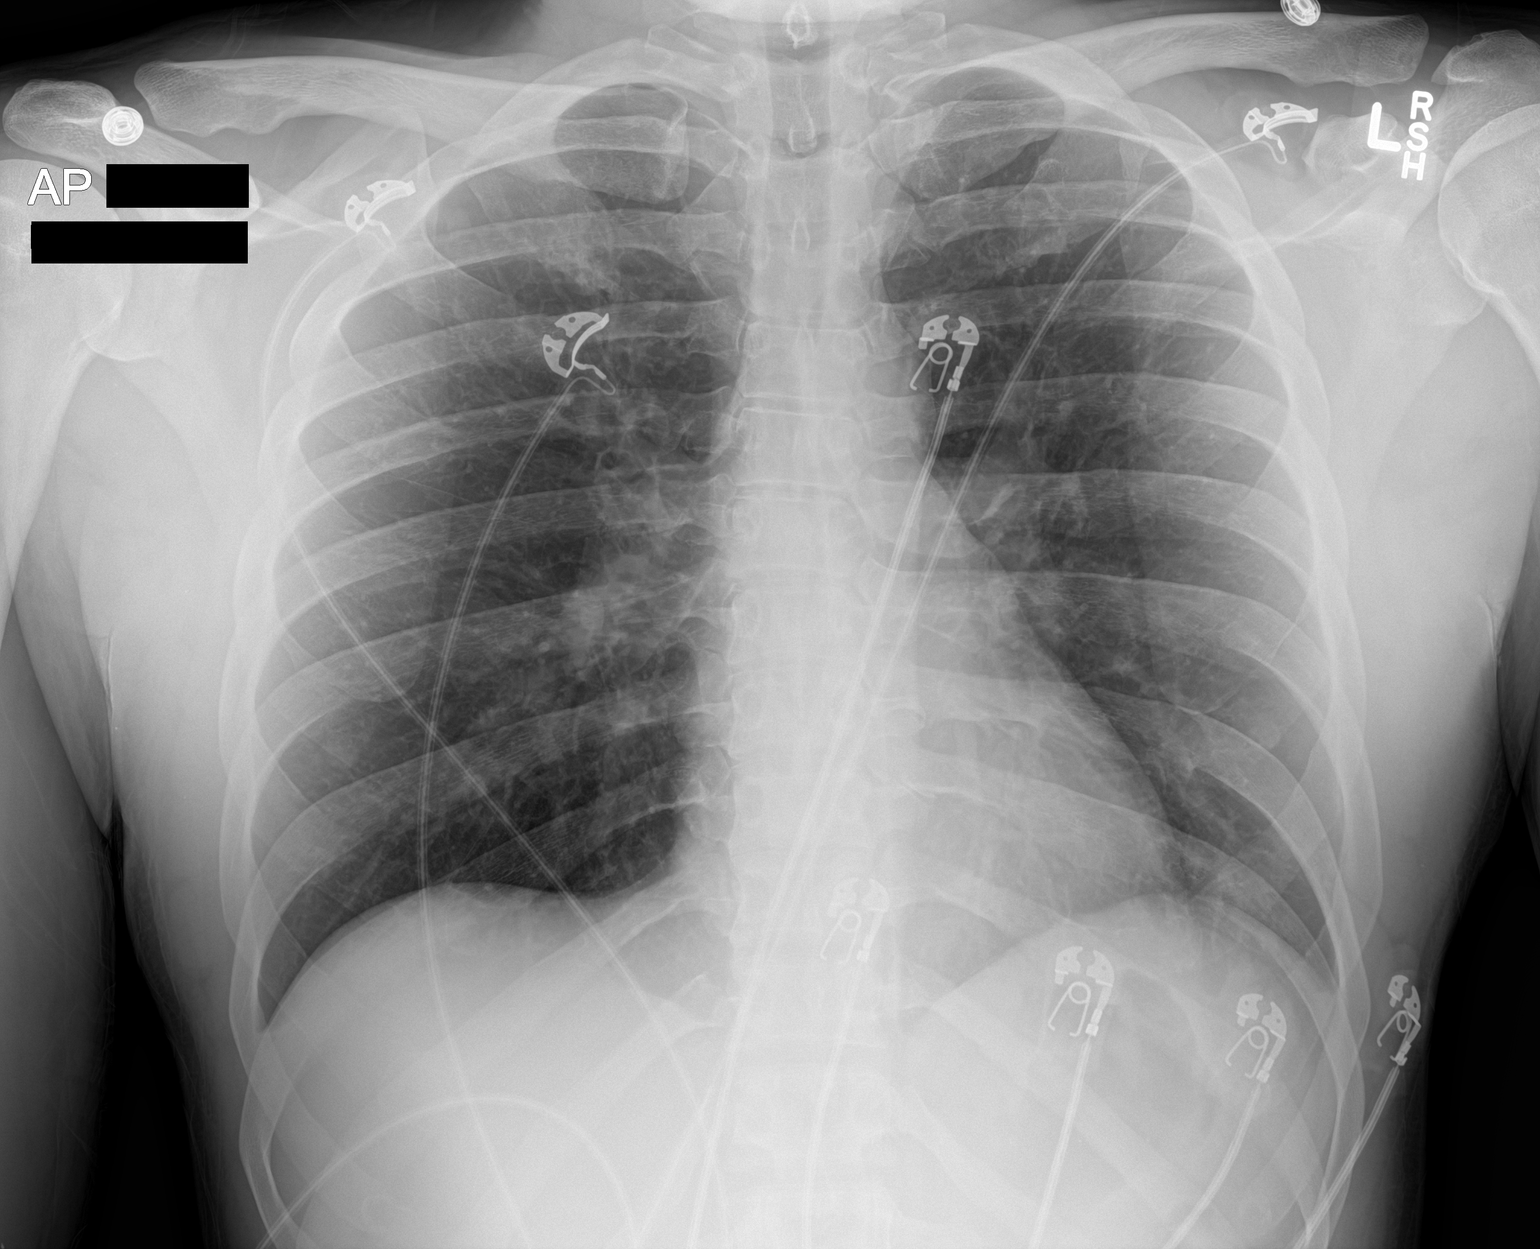

[1 of 1 positions shown; findings below may reference images not displayed]

FINDINGS: Portable AP upright view at 2243 hours. Lung volumes and mediastinal
contours remain within normal limits. Visualized tracheal air column
is within normal limits. Lung markings appear stable. Allowing for
portable technique the lungs are clear. No pneumothorax or pleural
effusion. Negative visible bowel gas pattern and osseous structures.
IMPRESSION: Stable since 3331.  Negative portable chest.

## 2021-11-20 ENCOUNTER — Other Ambulatory Visit: Payer: Self-pay

## 2021-11-20 ENCOUNTER — Emergency Department (HOSPITAL_COMMUNITY)
Admission: EM | Admit: 2021-11-20 | Discharge: 2021-11-20 | Disposition: A | Discharge status: Home / Self Care | Payer: Self-pay | Attending: Emergency Medicine | Admitting: Emergency Medicine

## 2021-11-20 ENCOUNTER — Encounter (HOSPITAL_COMMUNITY): Payer: Self-pay | Admitting: Emergency Medicine

## 2021-11-20 DIAGNOSIS — K0889 Other specified disorders of teeth and supporting structures: Secondary | ICD-10-CM | POA: Insufficient documentation

## 2021-11-20 DIAGNOSIS — J4550 Severe persistent asthma, uncomplicated: Secondary | ICD-10-CM | POA: Insufficient documentation

## 2021-11-20 MED ORDER — HYDROCODONE-ACETAMINOPHEN 5-325 MG PO TABS: 1.0000 | ORAL_TABLET | ORAL | 0 refills | Status: DC | PRN

## 2021-11-20 MED ORDER — PENICILLIN V POTASSIUM 500 MG PO TABS: 500.0000 mg | ORAL_TABLET | Freq: Four times a day (QID) | ORAL | 0 refills | Status: AC

## 2021-11-20 NOTE — ED Provider Notes (Signed)
[facility] EMERGENCY DEPARTMENT Provider Note   CSN: [id number] Arrival date & time: 11/20/21  2124     History Chief Complaint  Patient presents with   Dental Pain    Steven [name] is a 30 y.o. male.  30 year old male presents with complaint of right lower dental pain.  States he went to a dentist Tuesday and they tried to remove his right lower wisdom tooth and it broke off.  Patient's been having worsening pain since that time.  Taking naproxen without relief.        Past Medical History:  Diagnosis Date   Allergic rhinitis    Asthma    Bronchitis    Hx of tobacco use, presenting hazards to health    Marijuana abuse    Severe persistent asthma     Patient Active Problem List   Diagnosis Date Noted   Asthma exacerbation 05/07/2017   Non compliance with medical treatment 12/12/2016   Nausea with vomiting 12/03/2016   Acute bronchitis 09/16/2012   Status asthmaticus 09/15/2012   Acute respiratory failure with hypoxia (HCC) 09/15/2012   Sinus tachycardia 09/15/2012   Severe persistent asthma    Allergic rhinitis    Marijuana abuse    Hx of tobacco use, presenting hazards to health     Past Surgical History:  Procedure Laterality Date   TONSILLECTOMY  2008       Family History  Problem Relation Age of Onset   Asthma Mother    Cancer Mother    Asthma Brother     Social History   Tobacco Use   Smoking status: Never   Smokeless tobacco: Never  Substance Use Topics   Alcohol use: No    Comment: Occasional   Drug use: Yes    Types: Marijuana    Comment: occasionally    Home Medications Prior to Admission medications   Medication Sig Start Date End Date Taking? Authorizing Provider  HYDROcodone-acetaminophen (NORCO/VICODIN) 5-325 MG tablet Take 1 tablet by mouth every 4 (four) hours as needed. 11/20/21  Yes Army Melia A, PA-C  penicillin v potassium (VEETID) 500 MG tablet Take 1 tablet (500 mg total) by mouth 4 (four) times  daily for 10 days. 11/20/21 11/30/21 Yes Jeannie Fend, PA-C  albuterol (PROVENTIL HFA;VENTOLIN HFA) 108 (90 Base) MCG/ACT inhaler Inhale 2 puffs into the lungs every 4 (four) hours as needed for wheezing or shortness of breath. 09/23/17   Gilda Crease, MD  albuterol (PROVENTIL HFA;VENTOLIN HFA) 108 (90 Base) MCG/ACT inhaler Inhale 2 puffs every 6 (six) hours as needed into the lungs for wheezing or shortness of breath. 10/09/17   Dartha Lodge, PA-C  albuterol (PROVENTIL) (2.5 MG/3ML) 0.083% nebulizer solution Take 3 mLs (2.5 mg total) every 6 (six) hours as needed by nebulization for wheezing. 10/09/17   Dartha Lodge, PA-C  albuterol (PROVENTIL) (2.5 MG/3ML) 0.083% nebulizer solution Take 3 mLs (2.5 mg total) by nebulization every 6 (six) hours as needed for wheezing or shortness of breath. 12/30/19   Mannie Stabile, PA-C  albuterol (VENTOLIN HFA) 108 (90 Base) MCG/ACT inhaler Inhale 1-2 puffs into the lungs every 6 (six) hours as needed for wheezing or shortness of breath. 12/30/19   Mannie Stabile, PA-C  benzonatate (TESSALON) 100 MG capsule Take 1 capsule (100 mg total) by mouth every 8 (eight) hours. 12/30/19   Mannie Stabile, PA-C  brompheniramine-pseudoephedrine-DM 30-2-10 MG/5ML syrup Take 5 mLs by mouth 4 (four) times daily as needed. 12/25/19  Mardella Layman, MD  budesonide (PULMICORT) 0.25 MG/2ML nebulizer solution Take 2 mLs (0.25 mg total) by nebulization 2 (two) times daily. 05/08/17   Meredeth Ide, MD  cetirizine (ZYRTEC) 10 MG tablet Take 1 tablet (10 mg total) by mouth daily. 08/09/20   Bing Neighbors, FNP  ciprofloxacin (CILOXAN) 0.3 % ophthalmic ointment Apply 1 ribbon of ointment to the lower lid of your eyes at bedtime for 7 days 08/09/20   Bing Neighbors, FNP  ibuprofen (ADVIL,MOTRIN) 600 MG tablet Take 1 tablet (600 mg total) every 6 (six) hours as needed by mouth. 10/09/17   Dartha Lodge, PA-C  promethazine (PHENERGAN) 25 MG tablet Take 1 tablet (25 mg  total) by mouth every 6 (six) hours as needed for nausea or vomiting. 09/23/17 12/25/19  Gilda Crease, MD    Allergies    Patient has no known allergies.  Review of Systems   Review of Systems  Constitutional:  Negative for fever.  HENT:  Positive for dental problem. Negative for ear pain, facial swelling, trouble swallowing and voice change.   Gastrointestinal:  Negative for nausea and vomiting.  Musculoskeletal:  Negative for neck pain.  Skin:  Negative for rash and wound.  Allergic/Immunologic: Negative for immunocompromised state.  Neurological:  Negative for headaches.  Hematological:  Negative for adenopathy.  Psychiatric/Behavioral:  Negative for confusion.   All other systems reviewed and are negative.  Physical Exam Updated Vital Signs BP (!) 152/112    Pulse 81    Temp 98.5 F (36.9 C) (Oral)    Resp 18    SpO2 96%   Physical Exam Vitals and nursing note reviewed.  Constitutional:      General: He is not in acute distress.    Appearance: He is well-developed. He is not diaphoretic.  HENT:     Head: Normocephalic and atraumatic.     Jaw: No trismus.     Nose: Nose normal.     Mouth/Throat:     Mouth: Mucous membranes are moist.     Dentition: Gingival swelling and gum lesions present. No dental abscesses.     Pharynx: Oropharynx is clear. Uvula midline.   Eyes:     Conjunctiva/sclera: Conjunctivae normal.  Pulmonary:     Effort: Pulmonary effort is normal.  Musculoskeletal:     Cervical back: Neck supple. No tenderness.  Skin:    General: Skin is warm.     Findings: No erythema or rash.  Neurological:     Mental Status: He is alert and oriented to person, place, and time.  Psychiatric:        Behavior: Behavior normal.    ED Results / Procedures / Treatments   Labs (all labs ordered are listed, but only abnormal results are displayed) Labs Reviewed - No data to display  EKG None  Radiology No results found.  Procedures Procedures    Medications Ordered in ED Medications - No data to display  ED Course  I have reviewed the triage vital signs and the nursing notes.  Pertinent labs & imaging results that were available during my care of the patient were reviewed by me and considered in my medical decision making (see chart for details).  Clinical Course as of 11/20/21 2225  Sat Nov 20, 2021  2265 30 year old male presents with complaint of right lower dental pain.  States he went to a dentist Tuesday and they tried to remove his right lower wisdom tooth and it broke off.  Patient's  been having worsening pain since that time.  Taking naproxen without relief.  On exam, he is found to have inflammation of the right lower gingiva, no obvious abscess.  He is afebrile.  Patient was given topical lidocaine from the emergency room, discharged with amoxicillin and Norco with plan to follow back up with dentist for further treatment. [LM]    Clinical Course User Index [LM] Alden Hipp   MDM Rules/Calculators/A&P                             Final Clinical Impression(s) / ED Diagnoses Final diagnoses:  Pain, dental    Rx / DC Orders ED Discharge Orders          Ordered    HYDROcodone-acetaminophen (NORCO/VICODIN) 5-325 MG tablet  Every 4 hours PRN        11/20/21 2134    penicillin v potassium (VEETID) 500 MG tablet  4 times daily        11/20/21 2134             Jeannie Fend, PA-C 11/20/21 2225    Pricilla Loveless, MD 11/21/21 [phone]

## 2021-11-20 NOTE — ED Triage Notes (Signed)
Pt c/o right sided dental pain after a dentist attempted to pull his wisdom teeth.

## 2021-11-20 NOTE — Discharge Instructions (Signed)
Take antibiotics as prescribed and complete the full course.  Continue with ibuprofen at home for pain.  Take Norco as needed prescribed for pain not controlled with ibuprofen.  Follow-up with your dentist as soon as possible.

## 2021-12-30 ENCOUNTER — Other Ambulatory Visit: Payer: Self-pay

## 2021-12-30 ENCOUNTER — Ambulatory Visit (HOSPITAL_COMMUNITY)
Admission: EM | Admit: 2021-12-30 | Discharge: 2021-12-30 | Disposition: A | Discharge status: Home / Self Care | Payer: Self-pay | Attending: Family Medicine | Admitting: Family Medicine

## 2021-12-30 DIAGNOSIS — Z202 Contact with and (suspected) exposure to infections with a predominantly sexual mode of transmission: Secondary | ICD-10-CM

## 2021-12-30 DIAGNOSIS — Z113 Encounter for screening for infections with a predominantly sexual mode of transmission: Secondary | ICD-10-CM | POA: Insufficient documentation

## 2021-12-30 LAB — RPR: RPR Ser Ql: NONREACTIVE

## 2021-12-30 LAB — HIV ANTIBODY (ROUTINE TESTING W REFLEX): HIV Screen 4th Generation wRfx: NONREACTIVE

## 2021-12-30 NOTE — ED Triage Notes (Signed)
Pt presents to urgent care for STI testing.

## 2021-12-30 NOTE — Discharge Instructions (Addendum)
You were seen today for STD screening.  Swab and lab work will be resulted by tomorrow.  Since you are not having symptoms no treatment at this time.  Please call with any questions.

## 2021-12-30 NOTE — ED Provider Notes (Signed)
[facility]    CSN: [id number] Arrival date & time: 12/30/21  0802      History   Chief Complaint No chief complaint on file.   HPI Steven [name] is a 31 y.o. male.   Patient is here for std screening.  No known exposure.  No current symptoms.  Active with more than 1, rare condom use.  Would like swab and blood work.  No other concerns today.     Past Medical History:  Diagnosis Date   Allergic rhinitis    Asthma    Bronchitis    Hx of tobacco use, presenting hazards to health    Marijuana abuse    Severe persistent asthma     Patient Active Problem List   Diagnosis Date Noted   Asthma exacerbation 05/07/2017   Non compliance with medical treatment 12/12/2016   Nausea with vomiting 12/03/2016   Acute bronchitis 09/16/2012   Status asthmaticus 09/15/2012   Acute respiratory failure with hypoxia (HCC) 09/15/2012   Sinus tachycardia 09/15/2012   Severe persistent asthma    Allergic rhinitis    Marijuana abuse    Hx of tobacco use, presenting hazards to health     Past Surgical History:  Procedure Laterality Date   TONSILLECTOMY  2008       Home Medications    Prior to Admission medications   Medication Sig Start Date End Date Taking? Authorizing Provider  albuterol (PROVENTIL HFA;VENTOLIN HFA) 108 (90 Base) MCG/ACT inhaler Inhale 2 puffs into the lungs every 4 (four) hours as needed for wheezing or shortness of breath. 09/23/17   Gilda Crease, MD  albuterol (PROVENTIL HFA;VENTOLIN HFA) 108 (90 Base) MCG/ACT inhaler Inhale 2 puffs every 6 (six) hours as needed into the lungs for wheezing or shortness of breath. 10/09/17   Dartha Lodge, PA-C  albuterol (PROVENTIL) (2.5 MG/3ML) 0.083% nebulizer solution Take 3 mLs (2.5 mg total) every 6 (six) hours as needed by nebulization for wheezing. 10/09/17   Dartha Lodge, PA-C  albuterol (PROVENTIL) (2.5 MG/3ML) 0.083% nebulizer solution Take 3 mLs (2.5 mg total) by nebulization every 6 (six)  hours as needed for wheezing or shortness of breath. 12/30/19   Mannie Stabile, PA-C  albuterol (VENTOLIN HFA) 108 (90 Base) MCG/ACT inhaler Inhale 1-2 puffs into the lungs every 6 (six) hours as needed for wheezing or shortness of breath. 12/30/19   Mannie Stabile, PA-C  benzonatate (TESSALON) 100 MG capsule Take 1 capsule (100 mg total) by mouth every 8 (eight) hours. 12/30/19   Mannie Stabile, PA-C  brompheniramine-pseudoephedrine-DM 30-2-10 MG/5ML syrup Take 5 mLs by mouth 4 (four) times daily as needed. 12/25/19   Mardella Layman, MD  budesonide (PULMICORT) 0.25 MG/2ML nebulizer solution Take 2 mLs (0.25 mg total) by nebulization 2 (two) times daily. 05/08/17   Meredeth Ide, MD  cetirizine (ZYRTEC) 10 MG tablet Take 1 tablet (10 mg total) by mouth daily. 08/09/20   Bing Neighbors, FNP  ciprofloxacin (CILOXAN) 0.3 % ophthalmic ointment Apply 1 ribbon of ointment to the lower lid of your eyes at bedtime for 7 days 08/09/20   Bing Neighbors, FNP  HYDROcodone-acetaminophen (NORCO/VICODIN) 5-325 MG tablet Take 1 tablet by mouth every 4 (four) hours as needed. 11/20/21   Jeannie Fend, PA-C  ibuprofen (ADVIL,MOTRIN) 600 MG tablet Take 1 tablet (600 mg total) every 6 (six) hours as needed by mouth. 10/09/17   Dartha Lodge, PA-C  promethazine (PHENERGAN) 25 MG tablet Take  1 tablet (25 mg total) by mouth every 6 (six) hours as needed for nausea or vomiting. 09/23/17 12/25/19  Gilda Crease, MD    Family History Family History  Problem Relation Age of Onset   Asthma Mother    Cancer Mother    Asthma Brother     Social History Social History   Tobacco Use   Smoking status: Never   Smokeless tobacco: Never  Substance Use Topics   Alcohol use: No    Comment: Occasional   Drug use: Yes    Types: Marijuana    Comment: occasionally     Allergies   Patient has no known allergies.   Review of Systems Review of Systems  Constitutional: Negative.   HENT: Negative.     Respiratory: Negative.    Cardiovascular: Negative.   Gastrointestinal: Negative.   Genitourinary: Negative.     Physical Exam Triage Vital Signs ED Triage Vitals [12/30/21 0819]  Enc Vitals Group     BP (!) 145/91     Pulse Rate 84     Resp 16     Temp 98.3 F (36.8 C)     Temp Source Oral     SpO2 100 %     Weight      Height      Head Circumference      Peak Flow      Pain Score 0     Pain Loc      Pain Edu?      Excl. in GC?    No data found.  Updated Vital Signs BP (!) 145/91 (BP Location: Left Arm)    Pulse 84    Temp 98.3 F (36.8 C) (Oral)    Resp 16    SpO2 100%   Visual Acuity Right Eye Distance:   Left Eye Distance:   Bilateral Distance:    Right Eye Near:   Left Eye Near:    Bilateral Near:     Physical Exam Constitutional:      Appearance: Normal appearance.  Cardiovascular:     Rate and Rhythm: Normal rate and regular rhythm.  Pulmonary:     Effort: Pulmonary effort is normal.     Breath sounds: Normal breath sounds.  Musculoskeletal:     Cervical back: Normal range of motion and neck supple.     UC Treatments / Results  Labs (all labs ordered are listed, but only abnormal results are displayed) Labs Reviewed - No data to display  EKG   Radiology No results found.  Procedures Procedures (including critical care time)  Medications Ordered in UC Medications - No data to display  Initial Impression / Assessment and Plan / UC Course  I have reviewed the triage vital signs and the nursing notes.  Pertinent labs & imaging results that were available during my care of the patient were reviewed by me and considered in my medical decision making (see chart for details).  STD screening today.  No symptoms.  Will treat if needed.  Patient is aware.    Final Clinical Impressions(s) / UC Diagnoses   Final diagnoses:  Screening examination for STD (sexually transmitted disease)     Discharge Instructions      You were seen  today for STD screening.  Swab and lab work will be resulted by tomorrow.  Since you are not having symptoms no treatment at this time.  Please call with any questions.     ED Prescriptions   None  PDMP not reviewed this encounter.   Jannifer Franklin, MD 12/30/21 [phone]

## 2021-12-31 ENCOUNTER — Telehealth (HOSPITAL_COMMUNITY): Payer: Self-pay | Admitting: Emergency Medicine

## 2021-12-31 LAB — CYTOLOGY, (ORAL, ANAL, URETHRAL) ANCILLARY ONLY
Chlamydia: POSITIVE — AB
Comment: NEGATIVE
Comment: NEGATIVE
Comment: NORMAL
Neisseria Gonorrhea: NEGATIVE
Trichomonas: POSITIVE — AB

## 2021-12-31 MED ORDER — METRONIDAZOLE 500 MG PO TABS: 2000.0000 mg | ORAL_TABLET | Freq: Once | ORAL | 0 refills | Status: AC

## 2021-12-31 MED ORDER — DOXYCYCLINE HYCLATE 100 MG PO CAPS: 100.0000 mg | ORAL_CAPSULE | Freq: Two times a day (BID) | ORAL | 0 refills | Status: AC

## 2022-01-05 ENCOUNTER — Telehealth (HOSPITAL_COMMUNITY): Payer: Self-pay | Admitting: Emergency Medicine

## 2022-01-05 MED ORDER — METRONIDAZOLE 500 MG PO TABS: 500.0000 mg | ORAL_TABLET | Freq: Two times a day (BID) | ORAL | 0 refills | Status: DC

## 2022-01-19 ENCOUNTER — Other Ambulatory Visit: Payer: Self-pay

## 2022-01-19 ENCOUNTER — Ambulatory Visit (HOSPITAL_COMMUNITY)
Admission: EM | Admit: 2022-01-19 | Discharge: 2022-01-19 | Disposition: A | Discharge status: Home / Self Care | Payer: Self-pay | Attending: Nurse Practitioner | Admitting: Nurse Practitioner

## 2022-01-19 ENCOUNTER — Encounter (HOSPITAL_COMMUNITY): Payer: Self-pay | Admitting: *Deleted

## 2022-01-19 DIAGNOSIS — Z113 Encounter for screening for infections with a predominantly sexual mode of transmission: Secondary | ICD-10-CM

## 2022-01-19 DIAGNOSIS — Z202 Contact with and (suspected) exposure to infections with a predominantly sexual mode of transmission: Secondary | ICD-10-CM

## 2022-01-19 DIAGNOSIS — Z8619 Personal history of other infectious and parasitic diseases: Secondary | ICD-10-CM

## 2022-01-19 NOTE — ED Triage Notes (Signed)
Pt wants a recheck for STD . He was treated on 12-30-21 for same . Pt denies any Sx's. ?

## 2022-01-19 NOTE — ED Provider Notes (Signed)
?[facility] ? ? ? ?CSN: [id number] ?Arrival date & time: 01/19/22  0831 ? ? ?  ? ?History   ?Chief Complaint ?Chief Complaint  ?Patient presents with  ? recheck STD  ? ? ?HPI ?Steven [name] is a 31 y.o. male.  ? ?Patient reports a few weeks ago, he tested positive for chlamydia and trichomonas.  At that time, he was not having any symptoms.  He completed treatment for both STIs 2 days ago.  He reports he is still asymptomatic and denies any rashes, sores, penile discharge, dysuria, fever, nausea, vomiting, diarrhea, swollen lymph nodes.  He denies any new sexual encounters since he was tested.  He reports he has contacted his previous sexual encounters and they are undergoing testing.   ? ? ?Past Medical History:  ?Diagnosis Date  ? Allergic rhinitis   ? Asthma   ? Bronchitis   ? Hx of tobacco use, presenting hazards to health   ? Marijuana abuse   ? Severe persistent asthma   ? ? ?Patient Active Problem List  ? Diagnosis Date Noted  ? Asthma exacerbation 05/07/2017  ? Non compliance with medical treatment 12/12/2016  ? Nausea with vomiting 12/03/2016  ? Acute bronchitis 09/16/2012  ? Status asthmaticus 09/15/2012  ? Acute respiratory failure with hypoxia (HCC) 09/15/2012  ? Sinus tachycardia 09/15/2012  ? Severe persistent asthma   ? Allergic rhinitis   ? Marijuana abuse   ? Hx of tobacco use, presenting hazards to health   ? ? ?Past Surgical History:  ?Procedure Laterality Date  ? TONSILLECTOMY  2008  ? ? ? ? ? ?Home Medications   ? ?Prior to Admission medications   ?Medication Sig Start Date End Date Taking? Authorizing Provider  ?albuterol (PROVENTIL HFA;VENTOLIN HFA) 108 (90 Base) MCG/ACT inhaler Inhale 2 puffs into the lungs every 4 (four) hours as needed for wheezing or shortness of breath. 09/23/17   Gilda Crease, MD  ?albuterol (PROVENTIL HFA;VENTOLIN HFA) 108 (90 Base) MCG/ACT inhaler Inhale 2 puffs every 6 (six) hours as needed into the lungs for wheezing or shortness of breath. 10/09/17    Dartha Lodge, PA-C  ?albuterol (PROVENTIL) (2.5 MG/3ML) 0.083% nebulizer solution Take 3 mLs (2.5 mg total) every 6 (six) hours as needed by nebulization for wheezing. 10/09/17   Dartha Lodge, PA-C  ?albuterol (PROVENTIL) (2.5 MG/3ML) 0.083% nebulizer solution Take 3 mLs (2.5 mg total) by nebulization every 6 (six) hours as needed for wheezing or shortness of breath. 12/30/19   Mannie Stabile, PA-C  ?albuterol (VENTOLIN HFA) 108 (90 Base) MCG/ACT inhaler Inhale 1-2 puffs into the lungs every 6 (six) hours as needed for wheezing or shortness of breath. 12/30/19   Mannie Stabile, PA-C  ?benzonatate (TESSALON) 100 MG capsule Take 1 capsule (100 mg total) by mouth every 8 (eight) hours. 12/30/19   Mannie Stabile, PA-C  ?brompheniramine-pseudoephedrine-DM 30-2-10 MG/5ML syrup Take 5 mLs by mouth 4 (four) times daily as needed. 12/25/19   Mardella Layman, MD  ?budesonide (PULMICORT) 0.25 MG/2ML nebulizer solution Take 2 mLs (0.25 mg total) by nebulization 2 (two) times daily. 05/08/17   Meredeth Ide, MD  ?cetirizine (ZYRTEC) 10 MG tablet Take 1 tablet (10 mg total) by mouth daily. 08/09/20   Bing Neighbors, FNP  ?ciprofloxacin (CILOXAN) 0.3 % ophthalmic ointment Apply 1 ribbon of ointment to the lower lid of your eyes at bedtime for 7 days 08/09/20   Bing Neighbors, FNP  ?HYDROcodone-acetaminophen (NORCO/VICODIN) 5-325 MG tablet  Take 1 tablet by mouth every 4 (four) hours as needed. 11/20/21   Jeannie Fend, PA-C  ?ibuprofen (ADVIL,MOTRIN) 600 MG tablet Take 1 tablet (600 mg total) every 6 (six) hours as needed by mouth. 10/09/17   Dartha Lodge, PA-C  ?metroNIDAZOLE (FLAGYL) 500 MG tablet Take 1 tablet (500 mg total) by mouth 2 (two) times daily. 01/05/22   LampteyBritta Mccreedy, MD  ?promethazine (PHENERGAN) 25 MG tablet Take 1 tablet (25 mg total) by mouth every 6 (six) hours as needed for nausea or vomiting. 09/23/17 12/25/19  Gilda Crease, MD  ? ? ?Family History ?Family History  ?Problem  Relation Age of Onset  ? Asthma Mother   ? Cancer Mother   ? Asthma Brother   ? ? ?Social History ?Social History  ? ?Tobacco Use  ? Smoking status: Never  ? Smokeless tobacco: Never  ?Substance Use Topics  ? Alcohol use: No  ?  Comment: Occasional  ? Drug use: Yes  ?  Types: Marijuana  ?  Comment: occasionally  ? ? ? ?Allergies   ?Patient has no known allergies. ? ? ?Review of Systems ?Review of Systems ?Per HPI ? ?Physical Exam ?Triage Vital Signs ?ED Triage Vitals  ?Enc Vitals Group  ?   BP 01/19/22 0926 (!) 152/91  ?   Pulse Rate 01/19/22 0926 71  ?   Resp 01/19/22 0926 18  ?   Temp 01/19/22 0926 97.6 ?F (36.4 ?C)  ?   Temp src --   ?   SpO2 01/19/22 0926 97 %  ?   Weight --   ?   Height --   ?   Head Circumference --   ?   Peak Flow --   ?   Pain Score 01/19/22 0924 0  ?   Pain Loc --   ?   Pain Edu? --   ?   Excl. in GC? --   ? ?No data found. ? ?Updated Vital Signs ?BP (!) 152/91   Pulse 71   Temp 97.6 ?F (36.4 ?C)   Resp 18   SpO2 97%  ? ?Visual Acuity ?Right Eye Distance:   ?Left Eye Distance:   ?Bilateral Distance:   ? ?Right Eye Near:   ?Left Eye Near:    ?Bilateral Near:    ? ?Physical Exam ?Vitals and nursing note reviewed.  ?Constitutional:   ?   Appearance: Normal appearance. He is not toxic-appearing.  ?Genitourinary: ?   Comments: Deferred ?Skin: ?   General: Skin is warm and dry.  ?   Coloration: Skin is not jaundiced or pale.  ?   Findings: No erythema.  ?Neurological:  ?   Mental Status: He is alert and oriented to person, place, and time.  ?   Motor: No weakness.  ?   Gait: Gait normal.  ?Psychiatric:     ?   Mood and Affect: Mood normal.     ?   Behavior: Behavior normal.     ?   Thought Content: Thought content normal.     ?   Judgment: Judgment normal.  ? ? ? ?UC Treatments / Results  ?Labs ?(all labs ordered are listed, but only abnormal results are displayed) ?Labs Reviewed  ?CYTOLOGY, (ORAL, ANAL, URETHRAL) ANCILLARY ONLY  ? ? ?EKG ? ? ?Radiology ?No results  found. ? ?Procedures ?Procedures (including critical care time) ? ?Medications Ordered in UC ?Medications - No data to display ? ?Initial Impression / Assessment and Plan /  UC Course  ?I have reviewed the triage vital signs and the nursing notes. ? ?Pertinent labs & imaging results that were available during my care of the patient were reviewed by me and considered in my medical decision making (see chart for details). ? ?  ?Patient denies oral or anal sexual intercourse.  We will recheck cytology via urethral swab.  And treat as indicated.  He tested negative for HIV and syphilis last month, declines testing today.  I encouraged patient to wear condoms with every sexual encounter. ?Final Clinical Impressions(s) / UC Diagnoses  ? ?Final diagnoses:  ?History of chlamydia  ?History of trichomoniasis  ?Routine screening for STI (sexually transmitted infection)  ? ? ? ?Discharge Instructions   ? ?  ?We will let you know if anything comes back positive.  Please wear condoms with every sexual encounter. ? ? ? ? ?ED Prescriptions   ?None ?  ? ?PDMP not reviewed this encounter. ?  ?Valentino Nose, NP ?01/19/22 [phone] ? ?

## 2022-01-19 NOTE — Discharge Instructions (Addendum)
We will let you know if anything comes back positive.  Please wear condoms with every sexual encounter. ?

## 2022-01-20 ENCOUNTER — Telehealth (HOSPITAL_COMMUNITY): Payer: Self-pay | Admitting: Emergency Medicine

## 2022-01-20 LAB — CYTOLOGY, (ORAL, ANAL, URETHRAL) ANCILLARY ONLY
Chlamydia: POSITIVE — AB
Comment: NEGATIVE
Comment: NEGATIVE
Comment: NORMAL
Neisseria Gonorrhea: NEGATIVE
Trichomonas: POSITIVE — AB

## 2022-01-20 MED ORDER — DOXYCYCLINE HYCLATE 100 MG PO CAPS: 100.0000 mg | ORAL_CAPSULE | Freq: Two times a day (BID) | ORAL | 0 refills | Status: AC

## 2022-01-20 MED ORDER — METRONIDAZOLE 500 MG PO TABS: 2000.0000 mg | ORAL_TABLET | Freq: Once | ORAL | 0 refills | Status: AC

## 2022-01-29 ENCOUNTER — Encounter (HOSPITAL_COMMUNITY): Payer: Self-pay

## 2022-01-29 ENCOUNTER — Ambulatory Visit (HOSPITAL_COMMUNITY)
Admission: EM | Admit: 2022-01-29 | Discharge: 2022-01-29 | Disposition: A | Discharge status: Home / Self Care | Payer: Self-pay | Attending: Nurse Practitioner | Admitting: Nurse Practitioner

## 2022-01-29 ENCOUNTER — Other Ambulatory Visit: Payer: Self-pay

## 2022-01-29 DIAGNOSIS — Z202 Contact with and (suspected) exposure to infections with a predominantly sexual mode of transmission: Secondary | ICD-10-CM

## 2022-01-29 DIAGNOSIS — Z113 Encounter for screening for infections with a predominantly sexual mode of transmission: Secondary | ICD-10-CM

## 2022-01-29 NOTE — ED Provider Notes (Signed)
?[facility] ? ? ? ?CSN: [id number] ?Arrival date & time: 01/29/22  1506 ? ? ?  ? ?History   ?Chief Complaint ?Chief Complaint  ?Patient presents with  ? SEXUALLY TRANSMITTED DISEASE  ? ? ?HPI ?Steven [name] is a 31 y.o. male.  ? ?Patient is here requesting test for cure of both trichomonas and chlamydia.  He reports he completed treatment 2 days ago and had no issues with treatment.  Denies any fevers, nausea/vomiting, swollen lymph nodes in groin, rashes/lesions on genitalia. He denies any new sexual encounters since last visit.  ? ? ?Past Medical History:  ?Diagnosis Date  ? Allergic rhinitis   ? Asthma   ? Bronchitis   ? Hx of tobacco use, presenting hazards to health   ? Marijuana abuse   ? Severe persistent asthma   ? ? ?Patient Active Problem List  ? Diagnosis Date Noted  ? Asthma exacerbation 05/07/2017  ? Non compliance with medical treatment 12/12/2016  ? Nausea with vomiting 12/03/2016  ? Acute bronchitis 09/16/2012  ? Status asthmaticus 09/15/2012  ? Acute respiratory failure with hypoxia (HCC) 09/15/2012  ? Sinus tachycardia 09/15/2012  ? Severe persistent asthma   ? Allergic rhinitis   ? Marijuana abuse   ? Hx of tobacco use, presenting hazards to health   ? ? ?Past Surgical History:  ?Procedure Laterality Date  ? TONSILLECTOMY  2008  ? ? ? ? ? ?Home Medications   ? ?Prior to Admission medications   ?Medication Sig Start Date End Date Taking? Authorizing Provider  ?albuterol (PROVENTIL HFA;VENTOLIN HFA) 108 (90 Base) MCG/ACT inhaler Inhale 2 puffs into the lungs every 4 (four) hours as needed for wheezing or shortness of breath. 09/23/17   Gilda Crease, MD  ?albuterol (PROVENTIL HFA;VENTOLIN HFA) 108 (90 Base) MCG/ACT inhaler Inhale 2 puffs every 6 (six) hours as needed into the lungs for wheezing or shortness of breath. 10/09/17   Dartha Lodge, PA-C  ?albuterol (PROVENTIL) (2.5 MG/3ML) 0.083% nebulizer solution Take 3 mLs (2.5 mg total) every 6 (six) hours as needed by nebulization  for wheezing. 10/09/17   Dartha Lodge, PA-C  ?albuterol (PROVENTIL) (2.5 MG/3ML) 0.083% nebulizer solution Take 3 mLs (2.5 mg total) by nebulization every 6 (six) hours as needed for wheezing or shortness of breath. 12/30/19   Mannie Stabile, PA-C  ?albuterol (VENTOLIN HFA) 108 (90 Base) MCG/ACT inhaler Inhale 1-2 puffs into the lungs every 6 (six) hours as needed for wheezing or shortness of breath. 12/30/19   Mannie Stabile, PA-C  ?benzonatate (TESSALON) 100 MG capsule Take 1 capsule (100 mg total) by mouth every 8 (eight) hours. 12/30/19   Mannie Stabile, PA-C  ?brompheniramine-pseudoephedrine-DM 30-2-10 MG/5ML syrup Take 5 mLs by mouth 4 (four) times daily as needed. 12/25/19   Mardella Layman, MD  ?budesonide (PULMICORT) 0.25 MG/2ML nebulizer solution Take 2 mLs (0.25 mg total) by nebulization 2 (two) times daily. 05/08/17   Meredeth Ide, MD  ?cetirizine (ZYRTEC) 10 MG tablet Take 1 tablet (10 mg total) by mouth daily. 08/09/20   Bing Neighbors, FNP  ?ciprofloxacin (CILOXAN) 0.3 % ophthalmic ointment Apply 1 ribbon of ointment to the lower lid of your eyes at bedtime for 7 days 08/09/20   Bing Neighbors, FNP  ?HYDROcodone-acetaminophen (NORCO/VICODIN) 5-325 MG tablet Take 1 tablet by mouth every 4 (four) hours as needed. 11/20/21   Jeannie Fend, PA-C  ?ibuprofen (ADVIL,MOTRIN) 600 MG tablet Take 1 tablet (600 mg total) every 6 (  six) hours as needed by mouth. 10/09/17   Dartha Lodge, PA-C  ?promethazine (PHENERGAN) 25 MG tablet Take 1 tablet (25 mg total) by mouth every 6 (six) hours as needed for nausea or vomiting. 09/23/17 12/25/19  Gilda Crease, MD  ? ? ?Family History ?Family History  ?Problem Relation Age of Onset  ? Asthma Mother   ? Cancer Mother   ? Asthma Brother   ? ? ?Social History ?Social History  ? ?Tobacco Use  ? Smoking status: Never  ? Smokeless tobacco: Never  ?Substance Use Topics  ? Alcohol use: No  ?  Comment: Occasional  ? Drug use: Yes  ?  Types: Marijuana  ?   Comment: occasionally  ? ? ? ?Allergies   ?Patient has no known allergies. ? ? ?Review of Systems ?Review of Systems ?Per HPI ? ?Physical Exam ?Triage Vital Signs ?ED Triage Vitals  ?Enc Vitals Group  ?   BP 01/29/22 1613 140/82  ?   Pulse Rate 01/29/22 1613 82  ?   Resp 01/29/22 1613 17  ?   Temp 01/29/22 1613 98.1 ?F (36.7 ?C)  ?   Temp Source 01/29/22 1613 Oral  ?   SpO2 01/29/22 1613 95 %  ?   Weight --   ?   Height --   ?   Head Circumference --   ?   Peak Flow --   ?   Pain Score 01/29/22 1612 0  ?   Pain Loc --   ?   Pain Edu? --   ?   Excl. in GC? --   ? ?No data found. ? ?Updated Vital Signs ?BP 140/82 (BP Location: Left Arm)   Pulse 82   Temp 98.1 ?F (36.7 ?C) (Oral)   Resp 17   SpO2 95%  ? ?Visual Acuity ?Right Eye Distance:   ?Left Eye Distance:   ?Bilateral Distance:   ? ?Right Eye Near:   ?Left Eye Near:    ?Bilateral Near:    ? ?Physical Exam ?Vitals and nursing note reviewed.  ?Constitutional:   ?   General: He is not in acute distress. ?   Appearance: Normal appearance. He is not toxic-appearing.  ?Pulmonary:  ?   Effort: Pulmonary effort is normal. No respiratory distress.  ?Genitourinary: ?   Comments: deferred ?Skin: ?   General: Skin is warm and dry.  ?   Capillary Refill: Capillary refill takes less than 2 seconds.  ?   Coloration: Skin is not jaundiced or pale.  ?Neurological:  ?   Mental Status: He is alert and oriented to person, place, and time.  ?   Motor: No weakness.  ?   Gait: Gait normal.  ?Psychiatric:     ?   Behavior: Behavior is cooperative.  ? ? ? ?UC Treatments / Results  ?Labs ?(all labs ordered are listed, but only abnormal results are displayed) ?Labs Reviewed  ?CYTOLOGY, (ORAL, ANAL, URETHRAL) ANCILLARY ONLY  ? ? ?EKG ? ? ?Radiology ?No results found. ? ?Procedures ?Procedures (including critical care time) ? ?Medications Ordered in UC ?Medications - No data to display ? ?Initial Impression / Assessment and Plan / UC Course  ?I have reviewed the triage vital signs and the  nursing notes. ? ?Pertinent labs & imaging results that were available during my care of the patient were reviewed by me and considered in my medical decision making (see chart for details). ? ?  ?Will test urethral cytology today for gonorrhea, trichomonas,  and chlamydia.  Treat as indicated.  Encouraged no intercourse until results are back and are negative.  ?Final Clinical Impressions(s) / UC Diagnoses  ? ?Final diagnoses:  ?Routine screening for STI (sexually transmitted infection)  ? ? ? ?Discharge Instructions   ? ?  ?- Please refrain from intercourse until your results are back; we will contact you if treatment is needed.  ?- Please use condoms with every sexual encounter.  ? ? ? ? ?ED Prescriptions   ?None ?  ? ?PDMP not reviewed this encounter. ?  ?Valentino NoseMartinez, Rylie Knierim A, NP ?01/29/22 1628 ? ?

## 2022-01-29 NOTE — ED Triage Notes (Signed)
Pt presents for STD retesting. Pt states he has completed the treatment. Denies any concerns.  ?

## 2022-01-29 NOTE — Discharge Instructions (Addendum)
-   Please refrain from intercourse until your results are back; we will contact you if treatment is needed.  ?- Please use condoms with every sexual encounter.  ?

## 2022-01-31 LAB — CYTOLOGY, (ORAL, ANAL, URETHRAL) ANCILLARY ONLY
Chlamydia: NEGATIVE
Comment: NEGATIVE
Comment: NEGATIVE
Comment: NORMAL
Neisseria Gonorrhea: NEGATIVE
Trichomonas: NEGATIVE

## 2022-03-31 ENCOUNTER — Encounter (HOSPITAL_COMMUNITY): Payer: Self-pay

## 2022-03-31 ENCOUNTER — Ambulatory Visit (HOSPITAL_COMMUNITY)
Admission: EM | Admit: 2022-03-31 | Discharge: 2022-03-31 | Disposition: A | Discharge status: Home / Self Care | Payer: BC Managed Care – PPO | Attending: Internal Medicine | Admitting: Internal Medicine

## 2022-03-31 DIAGNOSIS — Z202 Contact with and (suspected) exposure to infections with a predominantly sexual mode of transmission: Secondary | ICD-10-CM | POA: Diagnosis present

## 2022-03-31 DIAGNOSIS — Z113 Encounter for screening for infections with a predominantly sexual mode of transmission: Secondary | ICD-10-CM | POA: Insufficient documentation

## 2022-03-31 MED ORDER — METRONIDAZOLE 500 MG PO TABS
ORAL_TABLET | ORAL | Status: AC
  Filled 2022-03-31: qty 4

## 2022-03-31 MED ORDER — METRONIDAZOLE 500 MG PO TABS
2000.0000 mg | ORAL_TABLET | Freq: Once | ORAL | Status: AC
  Administered 2022-03-31: 2000 mg via ORAL

## 2022-03-31 NOTE — Discharge Instructions (Signed)
Penile swab is pending.  We will call if it is positive.  You are being prophylactically treated for trichomonas with antibiotics today in urgent care due to confirmed exposure.  Please refrain from sexual activity until test results and treatment are complete. ?

## 2022-03-31 NOTE — ED Provider Notes (Signed)
?[facility] ? ? ? ?CSN: [id number] ?Arrival date & time: 03/31/22  [id number] ? ? ?  ? ?History   ?Chief Complaint ?No chief complaint on file. ? ? ?HPI ?Denver FasterMelvin Riel is a 31 y.o. male.  ? ?Patient presents due to exposure to trichomonas.  He reports that his sexual partner tested positive yesterday and he was exposed through sexual encounter approximately 1 month ago.  Denies any associated symptoms including penile discharge, dysuria, urinary frequency, testicular pain.  Patient declines wanting testing for HIV or syphilis. ? ? ? ?Past Medical History:  ?Diagnosis Date  ? Allergic rhinitis   ? Asthma   ? Bronchitis   ? Hx of tobacco use, presenting hazards to health   ? Marijuana abuse   ? Severe persistent asthma   ? ? ?Patient Active Problem List  ? Diagnosis Date Noted  ? Asthma exacerbation 05/07/2017  ? Non compliance with medical treatment 12/12/2016  ? Nausea with vomiting 12/03/2016  ? Acute bronchitis 09/16/2012  ? Status asthmaticus 09/15/2012  ? Acute respiratory failure with hypoxia (HCC) 09/15/2012  ? Sinus tachycardia 09/15/2012  ? Severe persistent asthma   ? Allergic rhinitis   ? Marijuana abuse   ? Hx of tobacco use, presenting hazards to health   ? ? ?Past Surgical History:  ?Procedure Laterality Date  ? TONSILLECTOMY  2008  ? ? ? ? ? ?Home Medications   ? ?Prior to Admission medications   ?Medication Sig Start Date End Date Taking? Authorizing Provider  ?albuterol (PROVENTIL HFA;VENTOLIN HFA) 108 (90 Base) MCG/ACT inhaler Inhale 2 puffs into the lungs every 4 (four) hours as needed for wheezing or shortness of breath. 09/23/17   Gilda CreasePollina, Christopher J, MD  ?albuterol (PROVENTIL HFA;VENTOLIN HFA) 108 (90 Base) MCG/ACT inhaler Inhale 2 puffs every 6 (six) hours as needed into the lungs for wheezing or shortness of breath. 10/09/17   Dartha LodgeFord, Kelsey N, PA-C  ?albuterol (PROVENTIL) (2.5 MG/3ML) 0.083% nebulizer solution Take 3 mLs (2.5 mg total) every 6 (six) hours as needed by nebulization for  wheezing. 10/09/17   Dartha LodgeFord, Kelsey N, PA-C  ?albuterol (PROVENTIL) (2.5 MG/3ML) 0.083% nebulizer solution Take 3 mLs (2.5 mg total) by nebulization every 6 (six) hours as needed for wheezing or shortness of breath. 12/30/19   Mannie StabileAberman, Caroline C, PA-C  ?albuterol (VENTOLIN HFA) 108 (90 Base) MCG/ACT inhaler Inhale 1-2 puffs into the lungs every 6 (six) hours as needed for wheezing or shortness of breath. 12/30/19   Mannie StabileAberman, Caroline C, PA-C  ?benzonatate (TESSALON) 100 MG capsule Take 1 capsule (100 mg total) by mouth every 8 (eight) hours. 12/30/19   Mannie StabileAberman, Caroline C, PA-C  ?brompheniramine-pseudoephedrine-DM 30-2-10 MG/5ML syrup Take 5 mLs by mouth 4 (four) times daily as needed. 12/25/19   Mardella LaymanHagler, Brian, MD  ?budesonide (PULMICORT) 0.25 MG/2ML nebulizer solution Take 2 mLs (0.25 mg total) by nebulization 2 (two) times daily. 05/08/17   Meredeth IdeLama, Gagan S, MD  ?cetirizine (ZYRTEC) 10 MG tablet Take 1 tablet (10 mg total) by mouth daily. 08/09/20   Bing NeighborsHarris, Kimberly S, FNP  ?ciprofloxacin (CILOXAN) 0.3 % ophthalmic ointment Apply 1 ribbon of ointment to the lower lid of your eyes at bedtime for 7 days 08/09/20   Bing NeighborsHarris, Kimberly S, FNP  ?HYDROcodone-acetaminophen (NORCO/VICODIN) 5-325 MG tablet Take 1 tablet by mouth every 4 (four) hours as needed. 11/20/21   Jeannie FendMurphy, Laura A, PA-C  ?ibuprofen (ADVIL,MOTRIN) 600 MG tablet Take 1 tablet (600 mg total) every 6 (six) hours as needed by mouth.  10/09/17   Dartha Lodge, PA-C  ?promethazine (PHENERGAN) 25 MG tablet Take 1 tablet (25 mg total) by mouth every 6 (six) hours as needed for nausea or vomiting. 09/23/17 12/25/19  Gilda Crease, MD  ? ? ?Family History ?Family History  ?Problem Relation Age of Onset  ? Asthma Mother   ? Cancer Mother   ? Asthma Brother   ? ? ?Social History ?Social History  ? ?Tobacco Use  ? Smoking status: Never  ? Smokeless tobacco: Never  ?Substance Use Topics  ? Alcohol use: No  ?  Comment: Occasional  ? Drug use: Yes  ?  Types: Marijuana  ?   Comment: occasionally  ? ? ? ?Allergies   ?Patient has no known allergies. ? ? ?Review of Systems ?Review of Systems ?Per HPI ? ?Physical Exam ?Triage Vital Signs ?ED Triage Vitals [03/31/22 0833]  ?Enc Vitals Group  ?   BP 128/79  ?   Pulse Rate 78  ?   Resp 18  ?   Temp 98.3 ?F (36.8 ?C)  ?   Temp Source Oral  ?   SpO2 98 %  ?   Weight   ?   Height   ?   Head Circumference   ?   Peak Flow   ?   Pain Score   ?   Pain Loc   ?   Pain Edu?   ?   Excl. in GC?   ? ?No data found. ? ?Updated Vital Signs ?BP 128/79 (BP Location: Left Arm)   Pulse 78   Temp 98.3 ?F (36.8 ?C) (Oral)   Resp 18   SpO2 98%  ? ?Visual Acuity ?Right Eye Distance:   ?Left Eye Distance:   ?Bilateral Distance:   ? ?Right Eye Near:   ?Left Eye Near:    ?Bilateral Near:    ? ?Physical Exam ?Constitutional:   ?   General: He is not in acute distress. ?   Appearance: Normal appearance. He is not toxic-appearing or diaphoretic.  ?HENT:  ?   Head: Normocephalic and atraumatic.  ?Eyes:  ?   Extraocular Movements: Extraocular movements intact.  ?   Conjunctiva/sclera: Conjunctivae normal.  ?Pulmonary:  ?   Effort: Pulmonary effort is normal.  ?Genitourinary: ?   Comments: Deferred with shared decision-making.  Self swab performed. ?Neurological:  ?   General: No focal deficit present.  ?   Mental Status: He is alert and oriented to person, place, and time. Mental status is at baseline.  ?Psychiatric:     ?   Mood and Affect: Mood normal.     ?   Behavior: Behavior normal.     ?   Thought Content: Thought content normal.     ?   Judgment: Judgment normal.  ? ? ? ?UC Treatments / Results  ?Labs ?(all labs ordered are listed, but only abnormal results are displayed) ?Labs Reviewed  ?CYTOLOGY, (ORAL, ANAL, URETHRAL) ANCILLARY ONLY  ? ? ?EKG ? ? ?Radiology ?No results found. ? ?Procedures ?Procedures (including critical care time) ? ?Medications Ordered in UC ?Medications  ?metroNIDAZOLE (FLAGYL) tablet 2,000 mg (has no administration in time range)   ? ? ?Initial Impression / Assessment and Plan / UC Course  ?I have reviewed the triage vital signs and the nursing notes. ? ?Pertinent labs & imaging results that were available during my care of the patient were reviewed by me and considered in my medical decision making (see chart for details). ? ?  ? ?  Will prophylactically treat for trichomonas exposure with 2 g Flagyl in urgent care today.  Cytology swab pending.  Patient advised to refrain from sexual activity until test results and treatment are complete.  Discussed return precautions.  Patient verbalized understanding and was agreeable with plan. ?Final Clinical Impressions(s) / UC Diagnoses  ? ?Final diagnoses:  ?Exposure to trichomonas  ?Screening examination for venereal disease  ? ? ? ?Discharge Instructions   ? ?  ?Penile swab is pending.  We will call if it is positive.  You are being prophylactically treated for trichomonas with antibiotics today in urgent care due to confirmed exposure.  Please refrain from sexual activity until test results and treatment are complete. ? ? ? ?ED Prescriptions   ?None ?  ? ?PDMP not reviewed this encounter. ?  ?Gustavus Bryant, [place] ?03/31/22 1324 ? ?

## 2022-03-31 NOTE — ED Triage Notes (Signed)
Exposure to Trichomoniasis , pt would like to be treated today.He does not report any symptoms at this time.  ?

## 2022-04-01 LAB — CYTOLOGY, (ORAL, ANAL, URETHRAL) ANCILLARY ONLY
Chlamydia: NEGATIVE
Comment: NEGATIVE
Comment: NEGATIVE
Comment: NORMAL
Neisseria Gonorrhea: NEGATIVE
Trichomonas: POSITIVE — AB

## 2022-04-05 ENCOUNTER — Telehealth: Payer: BC Managed Care – PPO | Admitting: Family Medicine

## 2022-04-05 DIAGNOSIS — Z202 Contact with and (suspected) exposure to infections with a predominantly sexual mode of transmission: Secondary | ICD-10-CM

## 2022-04-05 NOTE — Progress Notes (Signed)
[facility]  ? ?Might need repeat testing- will follow up in person  ? ?Patient acknowledged agreement and understanding of the plan.  ? ?

## 2022-04-20 ENCOUNTER — Telehealth: Payer: BC Managed Care – PPO | Admitting: Physician Assistant

## 2022-04-20 DIAGNOSIS — A599 Trichomoniasis, unspecified: Secondary | ICD-10-CM | POA: Diagnosis not present

## 2022-04-20 MED ORDER — TINIDAZOLE 500 MG PO TABS: 2.0000 g | ORAL_TABLET | Freq: Once | ORAL | 0 refills | Status: AC

## 2022-04-20 NOTE — Progress Notes (Signed)
Virtual Visit Consent   Steven [name], you are scheduled for a virtual visit with a [facility] provider today. Just as with appointments in the office, your consent must be obtained to participate. Your consent will be active for this visit and any virtual visit you may have with one of our providers in the next 365 days. If you have a MyChart account, a copy of this consent can be sent to you electronically.  As this is a virtual visit, video technology does not allow for your provider to perform a traditional examination. This may limit your provider's ability to fully assess your condition. If your provider identifies any concerns that need to be evaluated in person or the need to arrange testing (such as labs, EKG, etc.), we will make arrangements to do so. Although advances in technology are sophisticated, we cannot ensure that it will always work on either your end or our end. If the connection with a video visit is poor, the visit may have to be switched to a telephone visit. With either a video or telephone visit, we are not always able to ensure that we have a secure connection.  By engaging in this virtual visit, you consent to the provision of healthcare and authorize for your insurance to be billed (if applicable) for the services provided during this visit. Depending on your insurance coverage, you may receive a charge related to this service.  I need to obtain your verbal consent now. Are you willing to proceed with your visit today? Steven [name] has provided verbal consent on 04/20/2022 for a virtual visit (video or telephone). Piedad Climes, [place]  Date: 04/20/2022 9:03 AM  Virtual Visit via Video Note   I, Piedad Climes, connected with  Steven [name]  ([id number], Jul 22, 1991) on 04/20/22 at  9:00 AM EDT by a video-enabled telemedicine application and verified that I am speaking with the correct person using two identifiers.  Location: Patient: Virtual Visit Location  Patient: Home Provider: Virtual Visit Location Provider: Home Office   I discussed the limitations of evaluation and management by telemedicine and the availability of in person appointments. The patient expressed understanding and agreed to proceed.    History of Present Illness: Steven [name] is a 31 y.o. who identifies as a male who was assigned male at birth, and is being seen today for recurrence of trich. Notes he tested positive at urgent care visit on 5/12. Was treated with course of Flagyl even before test results came back since he had known exposure and symptoms. Other testing negative. Notes he was doing well but has had sexual activity with this partner again, now with recurrence of symptoms. Unsure if they were fully cured before engaging in activity again.  HPI: HPI  Problems:  Patient Active Problem List   Diagnosis Date Noted   Asthma exacerbation 05/07/2017   Non compliance with medical treatment 12/12/2016   Nausea with vomiting 12/03/2016   Acute bronchitis 09/16/2012   Status asthmaticus 09/15/2012   Acute respiratory failure with hypoxia (HCC) 09/15/2012   Sinus tachycardia 09/15/2012   Severe persistent asthma    Allergic rhinitis    Marijuana abuse    Hx of tobacco use, presenting hazards to health     Allergies: No Known Allergies Medications:  Current Outpatient Medications:    tinidazole (TINDAMAX) 500 MG tablet, Take 4 tablets (2,000 mg total) by mouth once for 1 dose., Disp: 4 tablet, Rfl: 0   albuterol (PROVENTIL HFA;VENTOLIN HFA) 108 (90 Base)  MCG/ACT inhaler, Inhale 2 puffs into the lungs every 4 (four) hours as needed for wheezing or shortness of breath., Disp: 1 Inhaler, Rfl: 2   albuterol (PROVENTIL HFA;VENTOLIN HFA) 108 (90 Base) MCG/ACT inhaler, Inhale 2 puffs every 6 (six) hours as needed into the lungs for wheezing or shortness of breath., Disp: 1 Inhaler, Rfl: 0   albuterol (PROVENTIL) (2.5 MG/3ML) 0.083% nebulizer solution, Take 3 mLs (2.5 mg  total) every 6 (six) hours as needed by nebulization for wheezing., Disp: 75 mL, Rfl: 0   albuterol (PROVENTIL) (2.5 MG/3ML) 0.083% nebulizer solution, Take 3 mLs (2.5 mg total) by nebulization every 6 (six) hours as needed for wheezing or shortness of breath., Disp: 75 mL, Rfl: 12   albuterol (VENTOLIN HFA) 108 (90 Base) MCG/ACT inhaler, Inhale 1-2 puffs into the lungs every 6 (six) hours as needed for wheezing or shortness of breath., Disp: 18 g, Rfl: 0   benzonatate (TESSALON) 100 MG capsule, Take 1 capsule (100 mg total) by mouth every 8 (eight) hours., Disp: 21 capsule, Rfl: 0   brompheniramine-pseudoephedrine-DM 30-2-10 MG/5ML syrup, Take 5 mLs by mouth 4 (four) times daily as needed., Disp: 120 mL, Rfl: 0   budesonide (PULMICORT) 0.25 MG/2ML nebulizer solution, Take 2 mLs (0.25 mg total) by nebulization 2 (two) times daily., Disp: 60 mL, Rfl: 12   cetirizine (ZYRTEC) 10 MG tablet, Take 1 tablet (10 mg total) by mouth daily., Disp: 30 tablet, Rfl: 0   ciprofloxacin (CILOXAN) 0.3 % ophthalmic ointment, Apply 1 ribbon of ointment to the lower lid of your eyes at bedtime for 7 days, Disp: 3.5 g, Rfl: 0   HYDROcodone-acetaminophen (NORCO/VICODIN) 5-325 MG tablet, Take 1 tablet by mouth every 4 (four) hours as needed., Disp: 6 tablet, Rfl: 0   ibuprofen (ADVIL,MOTRIN) 600 MG tablet, Take 1 tablet (600 mg total) every 6 (six) hours as needed by mouth., Disp: 30 tablet, Rfl: 0  Observations/Objective: Patient is well-developed, well-nourished in no acute distress.  Resting comfortably at home.  Head is normocephalic, atraumatic.  No labored breathing. Speech is clear and coherent with logical content.  Patient is alert and oriented at baseline.   Assessment and Plan: 1. Trichomoniasis - tinidazole (TINDAMAX) 500 MG tablet; Take 4 tablets (2,000 mg total) by mouth once for 1 dose.  Dispense: 4 tablet; Refill: 0  Recurrence due to repeat exposure. Discussed high importance of both he and this  partner being treated and refraining from activity until they both get repeat testing to confirm cure so they do not keep passing back and forth to each other. No other partner at present per pt report. RX Tinidazole 2000 mg single dose for treatment. Follow-up with in-person urgent care within a week for repeat testing.   Follow Up Instructions: I discussed the assessment and treatment plan with the patient. The patient was provided an opportunity to ask questions and all were answered. The patient agreed with the plan and demonstrated an understanding of the instructions.  A copy of instructions were sent to the patient via MyChart unless otherwise noted below.    The patient was advised to call back or seek an in-person evaluation if the symptoms worsen or if the condition fails to improve as anticipated.  Time:  I spent 10 minutes with the patient via telehealth technology discussing the above problems/concerns.    [name], PA-C

## 2022-04-20 NOTE — Patient Instructions (Signed)
Reginia Forts, thank you for joining [name], PA-C for today's virtual visit.  While this provider is not your primary care provider (PCP), if your PCP is located in our provider database this encounter information will be shared with them immediately following your visit.  Consent: (Patient) Steven [name] provided verbal consent for this virtual visit at the beginning of the encounter.  Current Medications:  Current Outpatient Medications:    albuterol (PROVENTIL HFA;VENTOLIN HFA) 108 (90 Base) MCG/ACT inhaler, Inhale 2 puffs into the lungs every 4 (four) hours as needed for wheezing or shortness of breath., Disp: 1 Inhaler, Rfl: 2   albuterol (PROVENTIL HFA;VENTOLIN HFA) 108 (90 Base) MCG/ACT inhaler, Inhale 2 puffs every 6 (six) hours as needed into the lungs for wheezing or shortness of breath., Disp: 1 Inhaler, Rfl: 0   albuterol (PROVENTIL) (2.5 MG/3ML) 0.083% nebulizer solution, Take 3 mLs (2.5 mg total) every 6 (six) hours as needed by nebulization for wheezing., Disp: 75 mL, Rfl: 0   albuterol (PROVENTIL) (2.5 MG/3ML) 0.083% nebulizer solution, Take 3 mLs (2.5 mg total) by nebulization every 6 (six) hours as needed for wheezing or shortness of breath., Disp: 75 mL, Rfl: 12   albuterol (VENTOLIN HFA) 108 (90 Base) MCG/ACT inhaler, Inhale 1-2 puffs into the lungs every 6 (six) hours as needed for wheezing or shortness of breath., Disp: 18 g, Rfl: 0   benzonatate (TESSALON) 100 MG capsule, Take 1 capsule (100 mg total) by mouth every 8 (eight) hours., Disp: 21 capsule, Rfl: 0   brompheniramine-pseudoephedrine-DM 30-2-10 MG/5ML syrup, Take 5 mLs by mouth 4 (four) times daily as needed., Disp: 120 mL, Rfl: 0   budesonide (PULMICORT) 0.25 MG/2ML nebulizer solution, Take 2 mLs (0.25 mg total) by nebulization 2 (two) times daily., Disp: 60 mL, Rfl: 12   cetirizine (ZYRTEC) 10 MG tablet, Take 1 tablet (10 mg total) by mouth daily., Disp: 30 tablet, Rfl: 0   ciprofloxacin (CILOXAN) 0.3 %  ophthalmic ointment, Apply 1 ribbon of ointment to the lower lid of your eyes at bedtime for 7 days, Disp: 3.5 g, Rfl: 0   HYDROcodone-acetaminophen (NORCO/VICODIN) 5-325 MG tablet, Take 1 tablet by mouth every 4 (four) hours as needed., Disp: 6 tablet, Rfl: 0   ibuprofen (ADVIL,MOTRIN) 600 MG tablet, Take 1 tablet (600 mg total) every 6 (six) hours as needed by mouth., Disp: 30 tablet, Rfl: 0   Medications ordered in this encounter:  No orders of the defined types were placed in this encounter.    *If you need refills on other medications prior to your next appointment, please contact your pharmacy*  Follow-Up: Call back or seek an in-person evaluation if the symptoms worsen or if the condition fails to improve as anticipated.  Other Instructions Take the tinidazole as instructed. Refrain from any sexual activity with this partner or new partner until you complete treatment, make sure symptoms have resolved and get retested to make sure clear. Your partner should do the same prior to you to engaging in activity again.   Trichomoniasis Trichomoniasis is a sexually transmitted infection (STI). Many people with trichomoniasis do not have any symptoms (are asymptomatic) or have only minimal symptoms. Untreated trichomoniasis can last from months to years. This condition is treated with medicine. What are the causes? This condition is caused by a parasite called Trichomonas vaginalis and is transmitted during sexual contact. What increases the risk? The following factors may make you more likely to develop this condition: Having unprotected sex. Having sex with  a partner who has trichomoniasis. Having multiple sexual partners. Having had previous trichomoniasis infections or other STIs. What are the signs or symptoms? In females, symptoms of trichomoniasis include: Itching, burning, redness, or soreness in the genital area. Discomfort while urinating. Abnormal vaginal discharge that is  clear, white, gray, or yellow-green and foamy and has an unusual fishy odor. In males, symptoms of trichomoniasis include: Discharge from the penis. Burning after urination or ejaculation. Itching or discomfort inside the penis. How is this diagnosed? This condition is diagnosed based on tests. To perform a test, your health care provider will do one of the following: Ask you to provide a urine sample. Take a sample of discharge. The sample may be taken from the vagina or cervix in females and from the urethra in males. Your health care provider may use a swab to collect the sample. Your health care provider may test you for other STIs, including human immunodeficiency virus (HIV). How is this treated?  This condition is treated with medicines such as metronidazole or tinidazole. These are called antimicrobial medicines, and they are taken by mouth (orally). Your sexual partner or partners also need to be tested and treated. If they have the infection and are not treated, you will likely get reinfected. If you plan to become pregnant or think you may be pregnant, tell your health care provider right away. Some medicines that are used to treat the infection should not be taken during pregnancy. Your health care provider may recommend over-the-counter medicines or creams to help relieve itching or irritation. You may be tested for the infection again 3 months after treatment. Follow these instructions at home: Medicines Take over-the-counter and prescription medicines only as told by your health care provider. Take your antimicrobial medicine as told by your health care provider. Do not stop taking it even if you start to feel better. Use creams as told by your health care provider. General instructions Do not have sex until after you finish your medicine and your symptoms have resolved. Do not wear tampons while you have the infection (if you are male). Talk with your sexual partner or  partners about any symptoms that either of you may have, as well as any history of STIs. Keep all follow-up visits. This is important. How is this prevented?  Use condoms every time you have sex. Using condoms correctly and consistently can help protect against STIs. Do not have sexual contact if you have symptoms of trichomoniasis or another STI. Avoid having multiple sexual partners. Get tested for STIs before you have sex with a partner. Ask all partners to do the same. Do not douche (if you are male). Douching may increase your risk for getting STIs due to the removal of good bacteria in the vagina. Contact a health care provider if: You still have symptoms after you finish your medicine. You develop a rash. You plan to become pregnant or think you may be pregnant. Summary Trichomoniasis is a sexually transmitted infection (STI). This condition often has no symptoms or minimal symptoms. Take your antimicrobial medicine as told by your health care provider. Do not stop taking even if you start to feel better. Discuss your infection with your sexual partner or partners. Make sure that all partners get tested and treated, if necessary. You should not have sex until after you finish your medicine and your symptoms have resolved. Keep all follow-up visits. This is important. This information is not intended to replace advice given to you by your  health care provider. Make sure you discuss any questions you have with your health care provider. Document Revised: 10/06/2021 Document Reviewed: 10/06/2021 Elsevier Patient Education  [place].    If you have been instructed to have an in-person evaluation today at a local Urgent Care facility, please use the link below. It will take you to a list of all of our available [facility], including address, phone number and hours of operation. Please do not delay care.  [facility]  If you or a family member do  not have a primary care provider, use the link below to schedule a visit and establish care. When you choose a [facility] primary care physician or advanced practice provider, you gain a long-term partner in health. Find a Primary Care Provider  Learn more about [facility]'s in-office and virtual care options: [facility]

## 2022-05-04 ENCOUNTER — Encounter: Payer: Self-pay | Admitting: Medical Oncology

## 2022-05-04 ENCOUNTER — Emergency Department: Payer: BC Managed Care – PPO

## 2022-05-04 ENCOUNTER — Emergency Department
Admission: EM | Admit: 2022-05-04 | Discharge: 2022-05-04 | Disposition: A | Discharge status: Home / Self Care | Payer: BC Managed Care – PPO | Attending: Emergency Medicine | Admitting: Emergency Medicine

## 2022-05-04 DIAGNOSIS — M25561 Pain in right knee: Secondary | ICD-10-CM | POA: Insufficient documentation

## 2022-05-04 MED ORDER — ETODOLAC 400 MG PO TABS: 400.0000 mg | ORAL_TABLET | Freq: Two times a day (BID) | ORAL | 0 refills | Status: DC

## 2022-05-04 NOTE — Discharge Instructions (Addendum)
Follow-up with your primary care provider or Dr. Okey Dupre who is on-call for orthopedics if any continued problems with your knee.  Ice and elevate as needed for pain.  A prescription for etodolac 400 mg was sent to the pharmacy for you to take twice a day with food.  If not improving you will definitely need to see an orthopedist for further evaluation.

## 2022-05-04 NOTE — ED Notes (Signed)
See triage note. Provider at bedside examining pt.

## 2022-05-04 NOTE — ED Triage Notes (Signed)
Pt ambulatory to triage with reports that he has been having rt leg pain in bend of leg x 2 days. Pain occurred after working on his knee. Pt denies swelling.

## 2022-05-04 NOTE — ED Provider Notes (Signed)
[facility] Provider Note    Event Date/Time   First MD Initiated Contact with Patient 05/04/22 1023     (approximate)   History   Leg Pain   HPI  Steven [name] is a 31 y.o. male   presents to the ED with complaint of right leg pain for 2 days without known history of injury.  Patient states that pain occurred after working on his knee.  No prior knee injury.      Physical Exam   Triage Vital Signs: ED Triage Vitals  Enc Vitals Group     BP 05/04/22 0929 (!) 142/87     Pulse Rate 05/04/22 0929 66     Resp 05/04/22 0929 16     Temp 05/04/22 0929 97.8 F (36.6 C)     Temp Source 05/04/22 0929 Oral     SpO2 05/04/22 0929 96 %     Weight 05/04/22 0930 160 lb (72.6 kg)     Height 05/04/22 0930 5\' 3"  (1.6 m)     Head Circumference --      Peak Flow --      Pain Score 05/04/22 0929 6     Pain Loc --      Pain Edu? --      Excl. in GC? --     Most recent vital signs: Vitals:   05/04/22 0929  BP: (!) 142/87  Pulse: 66  Resp: 16  Temp: 97.8 F (36.6 C)  SpO2: 96%     General: Awake, no distress.  CV:  Good peripheral perfusion.  Resp:  Normal effort.  Abd:  No distention.  Other:  No deformity noted of the right knee or effusion present.  No erythema or warmth present.  There is some soft tissue tenderness on palpation of the medial aspect of the knee.   ED Results / Procedures / Treatments   Labs (all labs ordered are listed, but only abnormal results are displayed) Labs Reviewed - No data to display    RADIOLOGY  Right knee images were reviewed by me and interpreted independent of the radiologist for negative acute bony changes.  Radiology report confirms this.   PROCEDURES:  Critical Care performed:   Procedures   MEDICATIONS ORDERED IN ED: Medications - No data to display   IMPRESSION / MDM / ASSESSMENT AND PLAN / ED COURSE  I reviewed the triage vital signs and the nursing notes.   Differential diagnosis  includes, but is not limited to, right knee pain, right knee strain, acute gout.  31 year old male presents to the ED with complaint of right knee pain without known history of injury.  Patient states has been hurting for the last 3 days and began while he was at work.  Patient has not taken any over-the-counter medication for his knee pain.  He continues to ambulate without any assistance.  Physical exam is benign.  X-ray of the right knee is reassuring.  Patient was made aware and a prescription was sent to his pharmacy for an anti-inflammatory for him to take twice a day with food.  He is to follow-up with his primary care provider if any continued problems or concerns.      Patient's presentation is most consistent with acute complicated illness / injury requiring diagnostic workup.  FINAL CLINICAL IMPRESSION(S) / ED DIAGNOSES   Final diagnoses:  Acute pain of right knee     Rx / DC Orders   ED Discharge Orders  Ordered    etodolac (LODINE) 400 MG tablet  2 times daily        05/04/22 1140             Note:  This document was prepared using Dragon voice recognition software and may include unintentional dictation errors.   Tommi Rumps, PA-C 05/04/22 1425    Chesley Noon, MD 05/05/22 [phone]

## 2022-06-14 ENCOUNTER — Telehealth: Payer: BC Managed Care – PPO | Admitting: Physician Assistant

## 2022-06-14 DIAGNOSIS — Z202 Contact with and (suspected) exposure to infections with a predominantly sexual mode of transmission: Secondary | ICD-10-CM

## 2022-06-14 NOTE — Progress Notes (Signed)
Patient sent for in-person evaluation due to need for STI testing and proper treatment.

## 2022-06-17 ENCOUNTER — Ambulatory Visit (HOSPITAL_COMMUNITY)
Admission: EM | Admit: 2022-06-17 | Discharge: 2022-06-17 | Disposition: A | Discharge status: Home / Self Care | Payer: BC Managed Care – PPO | Attending: Physician Assistant | Admitting: Physician Assistant

## 2022-06-17 ENCOUNTER — Encounter (HOSPITAL_COMMUNITY): Payer: Self-pay

## 2022-06-17 DIAGNOSIS — Z202 Contact with and (suspected) exposure to infections with a predominantly sexual mode of transmission: Secondary | ICD-10-CM

## 2022-06-17 MED ORDER — METRONIDAZOLE 500 MG PO TABS
2000.0000 mg | ORAL_TABLET | Freq: Once | ORAL | Status: AC
  Administered 2022-06-17: 2000 mg via ORAL

## 2022-06-17 MED ORDER — METRONIDAZOLE 500 MG PO TABS
ORAL_TABLET | ORAL | Status: AC
  Filled 2022-06-17: qty 4

## 2022-06-17 NOTE — ED Triage Notes (Signed)
Patient states his partner had Trich. He is not having any symptoms.

## 2022-06-17 NOTE — ED Provider Notes (Signed)
[facility]    CSN: [id number] Arrival date & time: 06/17/22  0806      History   Chief Complaint Chief Complaint  Patient presents with   Exposure to STD    HPI Steven [name] is a 31 y.o. male.   Patient here today for evaluation of STD exposure. He reports that his friend was recently released from jail and she was never tested for Trich previously (patient's prior exposure). He would like treatment for same. He denies any symptoms at this time.  The history is provided by the patient.  Exposure to STD    Past Medical History:  Diagnosis Date   Allergic rhinitis    Asthma    Bronchitis    Hx of tobacco use, presenting hazards to health    Marijuana abuse    Severe persistent asthma     Patient Active Problem List   Diagnosis Date Noted   Asthma exacerbation 05/07/2017   Non compliance with medical treatment 12/12/2016   Nausea with vomiting 12/03/2016   Acute bronchitis 09/16/2012   Status asthmaticus 09/15/2012   Acute respiratory failure with hypoxia (HCC) 09/15/2012   Sinus tachycardia 09/15/2012   Severe persistent asthma    Allergic rhinitis    Marijuana abuse    Hx of tobacco use, presenting hazards to health     Past Surgical History:  Procedure Laterality Date   TONSILLECTOMY  2008       Home Medications    Prior to Admission medications   Medication Sig Start Date End Date Taking? Authorizing Provider  albuterol (PROVENTIL) (2.5 MG/3ML) 0.083% nebulizer solution Take 3 mLs (2.5 mg total) by nebulization every 6 (six) hours as needed for wheezing or shortness of breath. 12/30/19   Mannie Stabile, PA-C  albuterol (VENTOLIN HFA) 108 (90 Base) MCG/ACT inhaler Inhale 1-2 puffs into the lungs every 6 (six) hours as needed for wheezing or shortness of breath. 12/30/19   Mannie Stabile, PA-C  budesonide (PULMICORT) 0.25 MG/2ML nebulizer solution Take 2 mLs (0.25 mg total) by nebulization 2 (two) times daily. 05/08/17   Meredeth Ide,  MD  etodolac (LODINE) 400 MG tablet Take 1 tablet (400 mg total) by mouth 2 (two) times daily. 05/04/22   Tommi Rumps, PA-C  promethazine (PHENERGAN) 25 MG tablet Take 1 tablet (25 mg total) by mouth every 6 (six) hours as needed for nausea or vomiting. 09/23/17 12/25/19  Gilda Crease, MD    Family History Family History  Problem Relation Age of Onset   Asthma Mother    Cancer Mother    Asthma Brother     Social History Social History   Tobacco Use   Smoking status: Never   Smokeless tobacco: Never  Substance Use Topics   Alcohol use: No    Comment: Occasional   Drug use: Yes    Types: Marijuana    Comment: occasionally     Allergies   Patient has no known allergies.   Review of Systems Review of Systems  Constitutional:  Negative for chills and fever.  Eyes:  Negative for discharge and redness.  Gastrointestinal:  Negative for nausea and vomiting.  Genitourinary:  Negative for flank pain, genital sores and penile discharge.  Neurological:  Negative for numbness.     Physical Exam Triage Vital Signs ED Triage Vitals  Enc Vitals Group     BP      Pulse      Resp  Temp      Temp src      SpO2      Weight      Height      Head Circumference      Peak Flow      Pain Score      Pain Loc      Pain Edu?      Excl. in GC?    No data found.  Updated Vital Signs BP 127/88 (BP Location: Left Arm)   Pulse 75   Temp 98.2 F (36.8 C) (Oral)   Resp 16   Ht 5\' 3"  (1.6 m)   Wt 160 lb 0.9 oz (72.6 kg)   SpO2 93%   BMI 28.35 kg/m      Physical Exam Vitals and nursing note reviewed.  Constitutional:      General: He is not in acute distress.    Appearance: Normal appearance. He is not ill-appearing.  HENT:     Head: Normocephalic and atraumatic.  Eyes:     Conjunctiva/sclera: Conjunctivae normal.  Cardiovascular:     Rate and Rhythm: Normal rate.  Pulmonary:     Effort: Pulmonary effort is normal.  Neurological:     Mental Status:  He is alert.  Psychiatric:        Mood and Affect: Mood normal.        Behavior: Behavior normal.        Thought Content: Thought content normal.      UC Treatments / Results  Labs (all labs ordered are listed, but only abnormal results are displayed) Labs Reviewed  CYTOLOGY, (ORAL, ANAL, URETHRAL) ANCILLARY ONLY    EKG   Radiology No results found.  Procedures Procedures (including critical care time)  Medications Ordered in UC Medications  metroNIDAZOLE (FLAGYL) tablet 2,000 mg (2,000 mg Oral Given 06/17/22 0831)    Initial Impression / Assessment and Plan / UC Course  I have reviewed the triage vital signs and the nursing notes.  Pertinent labs & imaging results that were available during my care of the patient were reviewed by me and considered in my medical decision making (see chart for details).    Metronidazole administered in office. Will await screening results for further recommendation. Encouraged follow up with any further concerns.   Final Clinical Impressions(s) / UC Diagnoses   Final diagnoses:  STD exposure   Discharge Instructions   None    ED Prescriptions   None    PDMP not reviewed this encounter.   06/19/22, PA-C 06/17/22 [phone]

## 2022-06-20 LAB — CYTOLOGY, (ORAL, ANAL, URETHRAL) ANCILLARY ONLY
Chlamydia: NEGATIVE
Comment: NEGATIVE
Comment: NEGATIVE
Comment: NORMAL
Neisseria Gonorrhea: NEGATIVE
Trichomonas: POSITIVE — AB

## 2022-08-27 ENCOUNTER — Emergency Department: Payer: BC Managed Care – PPO

## 2022-08-27 ENCOUNTER — Other Ambulatory Visit: Payer: Self-pay

## 2022-08-27 ENCOUNTER — Emergency Department
Admission: EM | Admit: 2022-08-27 | Discharge: 2022-08-27 | Disposition: A | Discharge status: Home / Self Care | Payer: BC Managed Care – PPO | Attending: Emergency Medicine | Admitting: Emergency Medicine

## 2022-08-27 DIAGNOSIS — D72819 Decreased white blood cell count, unspecified: Secondary | ICD-10-CM | POA: Insufficient documentation

## 2022-08-27 DIAGNOSIS — J4521 Mild intermittent asthma with (acute) exacerbation: Secondary | ICD-10-CM | POA: Diagnosis not present

## 2022-08-27 DIAGNOSIS — F17200 Nicotine dependence, unspecified, uncomplicated: Secondary | ICD-10-CM | POA: Diagnosis not present

## 2022-08-27 DIAGNOSIS — Z20822 Contact with and (suspected) exposure to covid-19: Secondary | ICD-10-CM | POA: Diagnosis not present

## 2022-08-27 DIAGNOSIS — R0602 Shortness of breath: Secondary | ICD-10-CM | POA: Diagnosis present

## 2022-08-27 LAB — TROPONIN I (HIGH SENSITIVITY): Troponin I (High Sensitivity): 5 ng/L (ref <18)

## 2022-08-27 LAB — D-DIMER, QUANTITATIVE: D-Dimer, Quant: <0.27 ug/mL-FEU (ref 0.00–0.50)

## 2022-08-27 LAB — RESP PANEL BY RT-PCR (FLU A&B, COVID) ARPGX2
Influenza A by PCR: NEGATIVE
Influenza B by PCR: NEGATIVE
SARS Coronavirus 2 by RT PCR: NEGATIVE

## 2022-08-27 LAB — COMPREHENSIVE METABOLIC PANEL
ALT: 36 U/L (ref 0–44)
AST: 27 U/L (ref 15–41)
Albumin: 4.1 g/dL (ref 3.5–5.0)
Alkaline Phosphatase: 44 U/L (ref 38–126)
Anion gap: 3 — ABNORMAL LOW (ref 5–15)
BUN: 17 mg/dL (ref 6–20)
CO2: 26 mmol/L (ref 22–32)
Calcium: 8.4 mg/dL — ABNORMAL LOW (ref 8.9–10.3)
Chloride: 110 mmol/L (ref 98–111)
Creatinine, Ser: 0.75 mg/dL (ref 0.61–1.24)
GFR, Estimated: >60 mL/min (ref >60)
Glucose, Bld: 95 mg/dL (ref 70–99)
Potassium: 4 mmol/L (ref 3.5–5.1)
Sodium: 139 mmol/L (ref 135–145)
Total Bilirubin: 1.2 mg/dL (ref 0.3–1.2)
Total Protein: 7 g/dL (ref 6.5–8.1)

## 2022-08-27 LAB — CBC WITH DIFFERENTIAL/PLATELET
Abs Immature Granulocytes: 0 10*3/uL (ref 0.00–0.07)
Basophils Absolute: 0 10*3/uL (ref 0.0–0.1)
Basophils Relative: 2 %
Eosinophils Absolute: 0.3 10*3/uL (ref 0.0–0.5)
Eosinophils Relative: 12 %
HCT: 45.3 % (ref 39.0–52.0)
Hemoglobin: 15.2 g/dL (ref 13.0–17.0)
Immature Granulocytes: 0 %
Lymphocytes Relative: 34 %
Lymphs Abs: 0.8 10*3/uL (ref 0.7–4.0)
MCH: 28.5 pg (ref 26.0–34.0)
MCHC: 33.6 g/dL (ref 30.0–36.0)
MCV: 85 fL (ref 80.0–100.0)
Monocytes Absolute: 0.3 10*3/uL (ref 0.1–1.0)
Monocytes Relative: 14 %
Neutro Abs: 0.9 10*3/uL — ABNORMAL LOW (ref 1.7–7.7)
Neutrophils Relative %: 38 %
Platelets: 234 10*3/uL (ref 150–400)
RBC: 5.33 MIL/uL (ref 4.22–5.81)
RDW: 12.1 % (ref 11.5–15.5)
Smear Review: NORMAL
WBC Morphology: >10
WBC: 2.3 10*3/uL — ABNORMAL LOW (ref 4.0–10.5)
nRBC: 0 % (ref 0.0–0.2)

## 2022-08-27 MED ORDER — ALBUTEROL SULFATE (2.5 MG/3ML) 0.083% IN NEBU
3.0000 mL | INHALATION_SOLUTION | RESPIRATORY_TRACT | Status: DC | PRN
  Administered 2022-08-27: 3 mL via RESPIRATORY_TRACT
  Filled 2022-08-27: qty 3

## 2022-08-27 MED ORDER — METHYLPREDNISOLONE SODIUM SUCC 125 MG IJ SOLR
125.0000 mg | Freq: Once | INTRAMUSCULAR | Status: AC
  Administered 2022-08-27: 125 mg via INTRAVENOUS
  Filled 2022-08-27: qty 2

## 2022-08-27 MED ORDER — STERILE WATER FOR INJECTION IJ SOLN
INTRAMUSCULAR | Status: AC
  Administered 2022-08-27: 2 mL
  Filled 2022-08-27: qty 10

## 2022-08-27 MED ORDER — IPRATROPIUM-ALBUTEROL 0.5-2.5 (3) MG/3ML IN SOLN
3.0000 mL | Freq: Once | RESPIRATORY_TRACT | Status: AC
  Administered 2022-08-27: 3 mL via RESPIRATORY_TRACT
  Filled 2022-08-27: qty 3

## 2022-08-27 MED ORDER — ALBUTEROL SULFATE HFA 108 (90 BASE) MCG/ACT IN AERS: 2.0000 | INHALATION_SPRAY | Freq: Four times a day (QID) | RESPIRATORY_TRACT | 2 refills | Status: DC | PRN

## 2022-08-27 MED ORDER — ALBUTEROL SULFATE (2.5 MG/3ML) 0.083% IN NEBU: 2.5000 mg | INHALATION_SOLUTION | Freq: Four times a day (QID) | RESPIRATORY_TRACT | 12 refills | Status: DC | PRN

## 2022-08-27 MED ORDER — AEROCHAMBER PLUS FLO-VU LARGE MISC: 1.0000 | Freq: Once | 0 refills | Status: AC

## 2022-08-27 MED ORDER — PREDNISONE 20 MG PO TABS: 40.0000 mg | ORAL_TABLET | Freq: Every day | ORAL | 0 refills | Status: AC

## 2022-08-27 NOTE — ED Triage Notes (Signed)
Pt arrives to ED via POV complaining of shortness of breath. Pt states started last night after "running around the house". Pt has hx of asthma but does not have any inhalers. Denies CP. Pt in NAD at this time

## 2022-08-27 NOTE — Discharge Instructions (Signed)
Return to the ER if develop worsening symptoms or any other concern.  Your COVID test was negative I suspect this is most likely related to your asthma.

## 2022-08-27 NOTE — ED Provider Notes (Signed)
[facility] Provider Note    Event Date/Time   First MD Initiated Contact with Patient 08/27/22 0700     (approximate)   History   Shortness of Breath   HPI  Steven [name] is a 31 y.o. male  with asthma history who comes in with SOB.  Patient reports shortness of breath over the past few days.  He denies any other respiratory symptoms.  He reports running out of his inhalers recently.  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  He states that he has a hard time getting them from doctors otherwise.  He denies any chest pain.  Denies any coughing feeling sick although does report one of his coworkers did have [place] but denies any COVID-like symptoms.  He denies any abdominal pain, falls or hitting his head or any other concerns.  Does report occasional smoking history.  On review of records patient had admission back in 2018 June, 17th for asthma exacerbation.  He denies ever needing to be intubated.   Physical Exam   Triage Vital Signs: ED Triage Vitals  Enc Vitals Group     BP 08/27/22 0643 (!) 125/91     Pulse Rate 08/27/22 0643 77     Resp 08/27/22 0643 20     Temp 08/27/22 0643 97.9 F (36.6 C)     Temp Source 08/27/22 0643 Oral     SpO2 08/27/22 0643 94 %     Weight 08/27/22 0644 170 lb (77.1 kg)     Height 08/27/22 0644 5\' 3"  (1.6 m)     Head Circumference --      Peak Flow --      Pain Score 08/27/22 0644 0     Pain Loc --      Pain Edu? --      Excl. in [place]? --     Most recent vital signs: Vitals:   08/27/22 0643  BP: (!) 125/91  Pulse: 77  Resp: 20  Temp: 97.9 F (36.6 C)  SpO2: 94%     General: Awake, no distress.  CV:  Good peripheral perfusion.  Resp:  Normal effort.  Clear lungs Abd:  No distention.  Other:  No swelling.  No calf tenderness   ED Results / Procedures / Treatments   Labs (all labs ordered are listed, but only abnormal results are displayed) Labs Reviewed   RESP PANEL BY RT-PCR (FLU A&B, COVID) ARPGX2  CBC WITH DIFFERENTIAL/PLATELET  COMPREHENSIVE METABOLIC PANEL  D-DIMER, QUANTITATIVE  TROPONIN I (HIGH SENSITIVITY)     EKG  My interpretation of EKG:  Normal sinus rate of 70 without any ST elevation, T wave inversion in lead III and aVF and lead II, normal intervals.  Reviewed prior EKG and similar T wave inversions  RADIOLOGY I have reviewed the xray personally and interpreted and no evidence of any pneumonia  PROCEDURES:  Critical Care performed: No  .1-3 Lead EKG Interpretation  Performed by: Vanessa Rebersburg, MD Authorized by: Vanessa Kahlotus, MD     Interpretation: normal     ECG rate:  70   ECG rate assessment: normal     Rhythm: sinus rhythm     Ectopy: none     Conduction: normal      MEDICATIONS ORDERED IN ED: Medications  albuterol (PROVENTIL) (2.5 MG/3ML) 0.083% nebulizer solution 3 mL (has no administration in time range)  ipratropium-albuterol (DUONEB) 0.5-2.5 (3) MG/3ML  nebulizer solution 3 mL (has no administration in time range)  methylPREDNISolone sodium succinate (SOLU-MEDROL) 125 mg/2 mL injection 125 mg (has no administration in time range)     IMPRESSION / MDM / ASSESSMENT AND PLAN / ED COURSE  I reviewed the triage vital signs and the nursing notes.   Patient's presentation is most consistent with acute presentation with potential threat to life or bodily function.   Differential includes asthma, COVID, PE, anemia  Patient unable to [facility] out due to oxygen levels consistently around 94%.  Given I do not hear any obvious wheezing on examination we will get D-dimer to evaluate for PE although he denies any other risk factors for PE therefore has low Wells criteria.  Troponin ordered to evaluate for ACS.  Patient will be given a breathing treatment and some steroids and reevaluated.  COVID test was negative.  CBC shows low white count.  He has had this previously 5 years ago patient did have HIV testing  done 8 months ago.  Discussed with patient that if he is high risk for sex that he can get a repeat HIV testing done at the health department he expressed understanding and can follow this up with a primary care doctor.  His CMP was overall reassuring.  Troponin negative.  D-dimer negative  On repeat assessment patient's oxygen level was 97 to 98% he reports feeling much better.  He feels comfortable with discharge home on some steroids with an inhaler and will return to the ER if notes worsening shortness of breath or any other concerns  The patient is on the cardiac monitor to evaluate for evidence of arrhythmia and/or significant heart rate changes.      FINAL CLINICAL IMPRESSION(S) / ED DIAGNOSES   Final diagnoses:  Mild intermittent asthma with exacerbation     Rx / DC Orders   ED Discharge Orders          Ordered    predniSONE (DELTASONE) 20 MG tablet  Daily with breakfast        08/27/22 0910    albuterol (VENTOLIN HFA) 108 (90 Base) MCG/ACT inhaler  Every 6 hours PRN        08/27/22 0910    Spacer/Aero-Holding Chambers (AEROCHAMBER PLUS FLO-VU LARGE) MISC   Once        08/27/22 0910    albuterol (PROVENTIL) (2.5 MG/3ML) 0.083% nebulizer solution  Every 6 hours PRN        08/27/22 0910             Note:  This document was prepared using Dragon voice recognition software and may include unintentional dictation errors.   Concha Se, MD 08/27/22 [phone]

## 2022-08-29 LAB — PATHOLOGIST SMEAR REVIEW

## 2022-09-07 ENCOUNTER — Ambulatory Visit: Payer: BC Managed Care – PPO | Admitting: Nurse Practitioner

## 2022-09-07 ENCOUNTER — Encounter: Payer: Self-pay | Admitting: Nurse Practitioner

## 2022-09-07 VITALS — BP 132/90 | HR 79 | Temp 97.5°F | Wt 171.8 lb

## 2022-09-07 DIAGNOSIS — J4540 Moderate persistent asthma, uncomplicated: Secondary | ICD-10-CM

## 2022-09-07 DIAGNOSIS — Z23 Encounter for immunization: Secondary | ICD-10-CM

## 2022-09-07 DIAGNOSIS — J4550 Severe persistent asthma, uncomplicated: Secondary | ICD-10-CM

## 2022-09-07 DIAGNOSIS — L989 Disorder of the skin and subcutaneous tissue, unspecified: Secondary | ICD-10-CM

## 2022-09-07 DIAGNOSIS — J45909 Unspecified asthma, uncomplicated: Secondary | ICD-10-CM | POA: Insufficient documentation

## 2022-09-07 DIAGNOSIS — Z0001 Encounter for general adult medical examination with abnormal findings: Secondary | ICD-10-CM | POA: Diagnosis not present

## 2022-09-07 MED ORDER — QVAR REDIHALER 80 MCG/ACT IN AERB: 2.0000 | INHALATION_SPRAY | Freq: Two times a day (BID) | RESPIRATORY_TRACT | 3 refills | Status: DC

## 2022-09-07 MED ORDER — TRIAMCINOLONE ACETONIDE 0.1 % EX CREA: 1.0000 | TOPICAL_CREAM | Freq: Two times a day (BID) | CUTANEOUS | 1 refills | Status: DC

## 2022-09-07 MED ORDER — ALBUTEROL SULFATE HFA 108 (90 BASE) MCG/ACT IN AERS: 2.0000 | INHALATION_SPRAY | Freq: Four times a day (QID) | RESPIRATORY_TRACT | 2 refills | Status: DC | PRN

## 2022-09-07 MED ORDER — ALBUTEROL SULFATE (2.5 MG/3ML) 0.083% IN NEBU: 2.5000 mg | INHALATION_SOLUTION | Freq: Four times a day (QID) | RESPIRATORY_TRACT | 12 refills | Status: AC | PRN

## 2022-09-07 NOTE — Progress Notes (Signed)
New Patient Visit  BP (!) 132/90 (BP Location: Left Arm)   Pulse 79   Temp (!) 97.5 F (36.4 C) (Temporal)   Wt 171 lb 12.8 oz (77.9 kg)   SpO2 93%   BMI 30.43 kg/m    Subjective:    Patient ID: Steven [name], male    DOB: 11-07-1991, 31 y.o.   MRN: [id number]  CC: Chief Complaint  Patient presents with   Establish Care    Np. Est care. No main concerns. Overall health assessment     HPI: Steven [name] is a 31 y.o. male presents for new patient visit to establish care.  Introduced to Designer, jewellery role and practice setting.  All questions answered.  Discussed provider/patient relationship and expectations.  He has a history of asthma and states that it is not well controlled. He uses an albuterol inhaler that he buys from an acquaintance about 4-5 times per day. He gets short of breath easily with stairs and notices wheezing at times. He states at one point he was told his nasal passages were narrow and he may need surgery. He would like a referral back to ENT.   He also has noticed some bumps on his back that have been there for several years. He states that they itch at times. He would like to see a dermatologist.   Depression and Anxiety Screen done:     09/07/2022   11:20 AM  Depression screen PHQ 2/9  Decreased Interest 1  Down, Depressed, Hopeless 0  PHQ - 2 Score 1  Altered sleeping 3  Change in appetite 2  Feeling bad or failure about yourself  0  Trouble concentrating 0  Moving slowly or fidgety/restless 0  Suicidal thoughts 0  PHQ-9 Score 6      09/07/2022   11:20 AM  GAD 7 : Generalized Anxiety Score  Nervous, Anxious, on Edge 0  Control/stop worrying 0  Worry too much - different things 0  Trouble relaxing 0  Restless 0  Easily annoyed or irritable 0  Afraid - awful might happen 0  Total GAD 7 Score 0  Anxiety Difficulty Not difficult at all    Past Medical History:  Diagnosis Date   Allergic rhinitis    Bronchitis    Hx of tobacco  use, presenting hazards to health    Marijuana abuse    Severe persistent asthma     Past Surgical History:  Procedure Laterality Date   TONSILLECTOMY  2008    Family History  Problem Relation Age of Onset   Asthma Mother    Cancer Mother        unsure   Asthma Brother      Social History   Tobacco Use   Smoking status: Never   Smokeless tobacco: Never  Vaping Use   Vaping Use: Never used  Substance Use Topics   Alcohol use: Not Currently   Drug use: Not Currently    Types: Marijuana    Comment: occasionally    Current Outpatient Medications on File Prior to Visit  Medication Sig Dispense Refill   [DISCONTINUED] promethazine (PHENERGAN) 25 MG tablet Take 1 tablet (25 mg total) by mouth every 6 (six) hours as needed for nausea or vomiting. 15 tablet 0   No current facility-administered medications on file prior to visit.     Review of Systems  Constitutional: Negative.   HENT: Negative.    Eyes: Negative.   Respiratory:  Positive for shortness of breath and  wheezing.   Gastrointestinal: Negative.   Genitourinary: Negative.   Musculoskeletal: Negative.   Skin:        Bumps on back  Neurological: Negative.   Psychiatric/Behavioral: Negative.        Objective:    BP (!) 132/90 (BP Location: Left Arm)   Pulse 79   Temp (!) 97.5 F (36.4 C) (Temporal)   Wt 171 lb 12.8 oz (77.9 kg)   SpO2 93%   BMI 30.43 kg/m   Wt Readings from Last 3 Encounters:  09/07/22 171 lb 12.8 oz (77.9 kg)  08/27/22 170 lb (77.1 kg)  06/17/22 160 lb 0.9 oz (72.6 kg)    BP Readings from Last 3 Encounters:  09/07/22 (!) 132/90  08/27/22 133/80  06/17/22 127/88    Physical Exam Vitals and nursing note reviewed.  Constitutional:      Appearance: Normal appearance.  HENT:     Head: Normocephalic.     Right Ear: Tympanic membrane, ear canal and external ear normal.     Left Ear: Tympanic membrane, ear canal and external ear normal.  Eyes:     Conjunctiva/sclera:  Conjunctivae normal.  Cardiovascular:     Rate and Rhythm: Normal rate and regular rhythm.     Pulses: Normal pulses.     Heart sounds: Normal heart sounds.  Pulmonary:     Effort: Pulmonary effort is normal.     Breath sounds: Normal breath sounds.  Abdominal:     Palpations: Abdomen is soft.     Tenderness: There is no abdominal tenderness.  Musculoskeletal:     Cervical back: Normal range of motion.  Skin:    General: Skin is warm.     Findings: Rash (macular bumps throughout back) present.  Neurological:     General: No focal deficit present.     Mental Status: He is alert and oriented to person, place, and time.  Psychiatric:        Mood and Affect: Mood normal.        Behavior: Behavior normal.        Thought Content: Thought content normal.        Judgment: Judgment normal.        Assessment & Plan:   Problem List Items Addressed This Visit       Respiratory   Asthma    Chronic, not controlled.  He is currently using an albuterol inhaler about 4-5 times per day.  He endorses shortness of breath with activity and wheezing.  We will have him start Qvar inhaler 80 mcg 2 puffs twice a day.  Discussed rinsing mouth out afterwards with water or brushing teeth.  Continue using albuterol inhaler as needed.  We will also order a nebulizer machine and albuterol nebulizer solution to help with shortness of breath and asthma.  He declines flu and pneumonia vaccines today.  He was told in the past that he has small nasal turbinates was also make it harder for him to breathe.  Referral placed to ENT per patient request.  Follow-up in 2 to 3 months, sooner if symptoms worsen.      Relevant Medications   albuterol (VENTOLIN HFA) 108 (90 Base) MCG/ACT inhaler   albuterol (PROVENTIL) (2.5 MG/3ML) 0.083% nebulizer solution   beclomethasone (QVAR REDIHALER) 80 MCG/ACT inhaler   Other Relevant Orders   For home use only DME Nebulizer machine   Ambulatory referral to ENT   Other Visit  Diagnoses     Encounter for general adult medical  examination with abnormal findings    -  Primary   Health maintenance reviewed and updated. Declined flu, pna vaccines. Follow-up 1 year.    Bumps on skin       Will treat with with triamcinolone cream BID prn and place referral to dermatology.    Relevant Orders   Ambulatory referral to Dermatology   Need for Tdap vaccination       Tdap booster given today   Relevant Orders   Tdap vaccine greater than or equal to 7yo IM (Completed)        IMMUNIZATIONS:   - Tdap: Tetanus vaccination status reviewed: Td vaccination indicated and given today. - Influenza: Refused - Pneumovax: Refused - HPV: Not applicable - Zostavax vaccine: Not applicable  SCREENING: - Colonoscopy: Not applicable  Discussed with patient purpose of the colonoscopy is to detect colon cancer at curable precancerous or early stages   - AAA Screening: Not applicable  -Hearing Test: Not applicable  -Spirometry: Not applicable   PATIENT COUNSELING:    Sexuality: Discussed sexually transmitted diseases, partner selection, use of condoms, avoidance of unintended pregnancy  and contraceptive alternatives.   Advised to avoid cigarette smoking.  I discussed with the patient that most people either abstain from alcohol or drink within safe limits (<=14/week and <=4 drinks/occasion for males, <=7/weeks and <= 3 drinks/occasion for females) and that the risk for alcohol disorders and other health effects rises proportionally with the number of drinks per week and how often a drinker exceeds daily limits.  Discussed cessation/primary prevention of drug use and availability of treatment for abuse.   Diet: Encouraged to adjust caloric intake to maintain  or achieve ideal body weight, to reduce intake of dietary saturated fat and total fat, to limit sodium intake by avoiding high sodium foods and not adding table salt, and to maintain adequate dietary potassium and calcium  preferably from fresh fruits, vegetables, and low-fat dairy products.    stressed the importance of regular exercise  Injury prevention: Discussed safety belts, safety helmets, smoke detector, smoking near bedding or upholstery.   Dental health: Discussed importance of regular tooth brushing, flossing, and dental visits.   Follow up plan: NEXT PREVENTATIVE PHYSICAL DUE IN 1 YEAR.   Follow up plan: Return in about 3 months (around 12/08/2022) for asthma.

## 2022-09-07 NOTE — Patient Instructions (Addendum)
It was great to see you!  Start Qvar inhaler 2 puffs twice a day every day. Rinse your mouth out with water afterwards.   I have refilled your ventolin inhaler and albuterol nebulizer to use as needed for shortness of breath. I have ordered the nebulizer machine as well.   I have placed a referral to ENT and dermatology.   You can also start triamcinolone cream twice a day as needed to the bumps on your back.   Let's follow-up in 3 months, sooner if you have concerns.  If a referral was placed today, you will be contacted for an appointment. Please note that routine referrals can sometimes take up to 3-4 weeks to process. Please call our office if you haven't heard anything after this time frame.  Take care,  Vance Peper, NP

## 2022-09-07 NOTE — Assessment & Plan Note (Signed)
Chronic, not controlled.  He is currently using an albuterol inhaler about 4-5 times per day.  He endorses shortness of breath with activity and wheezing.  We will have him start Qvar inhaler 80 mcg 2 puffs twice a day.  Discussed rinsing mouth out afterwards with water or brushing teeth.  Continue using albuterol inhaler as needed.  We will also order a nebulizer machine and albuterol nebulizer solution to help with shortness of breath and asthma.  He declines flu and pneumonia vaccines today.  He was told in the past that he has small nasal turbinates was also make it harder for him to breathe.  Referral placed to ENT per patient request.  Follow-up in 2 to 3 months, sooner if symptoms worsen.

## 2022-09-12 NOTE — Addendum Note (Signed)
Addended by: Vance Peper A on: 09/12/2022 09:08 AM   Modules accepted: Level of Service

## 2022-09-22 ENCOUNTER — Ambulatory Visit: Payer: BC Managed Care – PPO | Admitting: Nurse Practitioner

## 2022-09-22 ENCOUNTER — Telehealth: Payer: Self-pay | Admitting: Nurse Practitioner

## 2022-09-22 NOTE — Telephone Encounter (Signed)
11.2.23 no show letter sent 

## 2022-09-23 NOTE — Telephone Encounter (Signed)
Noted  

## 2022-09-23 NOTE — Telephone Encounter (Signed)
1st no show, fee waived, letter sent 

## 2022-09-27 ENCOUNTER — Ambulatory Visit: Payer: BC Managed Care – PPO | Admitting: Nurse Practitioner

## 2022-10-05 ENCOUNTER — Ambulatory Visit: Payer: BC Managed Care – PPO | Admitting: Nurse Practitioner

## 2022-10-05 ENCOUNTER — Encounter: Payer: Self-pay | Admitting: Nurse Practitioner

## 2022-10-05 VITALS — BP 144/90 | HR 84 | Temp 97.7°F | Ht 63.0 in | Wt 180.4 lb

## 2022-10-05 DIAGNOSIS — Z13220 Encounter for screening for lipoid disorders: Secondary | ICD-10-CM | POA: Diagnosis not present

## 2022-10-05 DIAGNOSIS — J4550 Severe persistent asthma, uncomplicated: Secondary | ICD-10-CM | POA: Diagnosis not present

## 2022-10-05 DIAGNOSIS — E049 Nontoxic goiter, unspecified: Secondary | ICD-10-CM | POA: Diagnosis not present

## 2022-10-05 DIAGNOSIS — E042 Nontoxic multinodular goiter: Secondary | ICD-10-CM

## 2022-10-05 DIAGNOSIS — R030 Elevated blood-pressure reading, without diagnosis of hypertension: Secondary | ICD-10-CM | POA: Insufficient documentation

## 2022-10-05 LAB — COMPREHENSIVE METABOLIC PANEL
ALT: 42 U/L (ref 0–53)
AST: 22 U/L (ref 0–37)
Albumin: 4.2 g/dL (ref 3.5–5.2)
Alkaline Phosphatase: 41 U/L (ref 39–117)
BUN: 12 mg/dL (ref 6–23)
CO2: 29 mEq/L (ref 19–32)
Calcium: 9.3 mg/dL (ref 8.4–10.5)
Chloride: 105 mEq/L (ref 96–112)
Creatinine, Ser: 0.89 mg/dL (ref 0.40–1.50)
GFR: 113.06 mL/min (ref >60.00)
Glucose, Bld: 117 mg/dL — ABNORMAL HIGH (ref 70–99)
Potassium: 3.7 mEq/L (ref 3.5–5.1)
Sodium: 139 mEq/L (ref 135–145)
Total Bilirubin: 1.1 mg/dL (ref 0.2–1.2)
Total Protein: 6.7 g/dL (ref 6.0–8.3)

## 2022-10-05 LAB — CBC WITH DIFFERENTIAL/PLATELET
Basophils Absolute: 0 10*3/uL (ref 0.0–0.1)
Basophils Relative: 1 % (ref 0.0–3.0)
Eosinophils Absolute: 0.3 10*3/uL (ref 0.0–0.7)
Eosinophils Relative: 10.6 % — ABNORMAL HIGH (ref 0.0–5.0)
HCT: 46.8 % (ref 39.0–52.0)
Hemoglobin: 15.8 g/dL (ref 13.0–17.0)
Lymphocytes Relative: 28.1 % (ref 12.0–46.0)
Lymphs Abs: 0.8 10*3/uL (ref 0.7–4.0)
MCHC: 33.8 g/dL (ref 30.0–36.0)
MCV: 84.7 fl (ref 78.0–100.0)
Monocytes Absolute: 0.3 10*3/uL (ref 0.1–1.0)
Monocytes Relative: 10.9 % (ref 3.0–12.0)
Neutro Abs: 1.4 10*3/uL (ref 1.4–7.7)
Neutrophils Relative %: 49.4 % (ref 43.0–77.0)
Platelets: 236 10*3/uL (ref 150.0–400.0)
RBC: 5.52 Mil/uL (ref 4.22–5.81)
RDW: 13.3 % (ref 11.5–15.5)
WBC: 2.8 10*3/uL — ABNORMAL LOW (ref 4.0–10.5)

## 2022-10-05 LAB — LIPID PANEL
Cholesterol: 202 mg/dL — ABNORMAL HIGH (ref 0–200)
HDL: 72.2 mg/dL (ref >39.00)
LDL Cholesterol: 96 mg/dL (ref 0–99)
NonHDL: 129.52
Total CHOL/HDL Ratio: 3
Triglycerides: 169 mg/dL — ABNORMAL HIGH (ref 0.0–149.0)
VLDL: 33.8 mg/dL (ref 0.0–40.0)

## 2022-10-05 NOTE — Progress Notes (Signed)
Established Patient Office Visit  Subjective   Patient ID: Steven [name], male    DOB: [birth year]  Age: 31 y.o. 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  He denies chest pain, shortness of breath, headaches.  He states that he tends to eat out frequently because of his job.  He also has not been exercising as much.  During his DOT exam, they also noticed some swelling in his neck.  He was told to come to his PCP to check on his thyroid.  He denies any pain or trouble swallowing.  He did not notice when this started swelling originally.  He states that his asthma is overall doing better, however he is still having shortness of breath with minimal activity.  He is using his Qvar inhaler twice a day.  He states that at work it is hard for him to climb, sometimes he has to climb a ladder multiple steps which causes him to be short of breath.  He has to take multiple breaks to be able to do this.  He is wondering if he can get a note that says he does not need to climb for work.    ROS See pertinent positives and negatives per HPI.    Objective:     BP (!) 144/90 (BP Location: Left Arm, Cuff Size: Large)   Pulse 84   Temp 97.7 F (36.5 C) (Temporal)   Ht 5\' 3"  (1.6 m)   Wt 180 lb 6.4 oz (81.8 kg)   SpO2 95%   BMI 31.96 kg/m  BP Readings from Last 3 Encounters:  10/05/22 (!) 144/90  09/07/22 (!) 132/90  08/27/22 133/80   Wt Readings from Last 3 Encounters:  10/05/22 180 lb 6.4 oz (81.8 kg)  09/07/22 171 lb 12.8 oz (77.9 kg)  08/27/22 170 lb (77.1 kg)      Physical Exam Vitals and nursing note reviewed.  Constitutional:      Appearance: Normal appearance.  HENT:     Head: Normocephalic.  Eyes:     Conjunctiva/sclera: Conjunctivae normal.  Neck:     Thyroid: Thyromegaly present.   Cardiovascular:     Rate and Rhythm: Normal rate and regular rhythm.     Pulses: Normal pulses.     Heart sounds: Normal heart sounds.  Pulmonary:     Effort: Pulmonary effort is normal.     Breath sounds: Normal breath sounds.  Musculoskeletal:     Cervical back: Normal range of motion.  Skin:    General: Skin is warm.  Neurological:     General: No focal deficit present.     Mental Status: He is alert and oriented to person, place, and time.  Psychiatric:        Mood and Affect: Mood normal.        Behavior: Behavior normal.        Thought Content: Thought content normal.        Judgment: Judgment normal.      Assessment & Plan:   Problem List Items Addressed This Visit       Respiratory   Asthma    Overall, his symptoms are doing much better since starting the Qvar inhaler twice a day.  He is using his albuterol  inhaler as needed which is not too often.  He is still having significant shortness of breath with activity.  We will place referral to pulmonology with ongoing shortness of breath.  Work note provided.      Relevant Orders   Ambulatory referral to Pulmonology   CBC with Differential/Platelet   Comprehensive metabolic panel     Endocrine   Enlarged thyroid - Primary    Enlarged thyroid noted on exam today.  We will check thyroid panel and thyroid ultrasound.      Relevant Orders   US THYROID   CBC with Differential/Platelet   Comprehensive metabolic panel   Thyroid Panel With TSH     Other   Elevated blood pressure reading    Blood pressure is elevated today at 144/90.  Encouraged him to start checking his blood pressure at home.  Also discussed limiting salt, trying to eat out less frequently, and increasing exercise.  Also discussed smoking cessation.  We will have him follow-up in 6 to 8 weeks.      Relevant Orders   CBC with Differential/Platelet   Comprehensive metabolic panel   Other Visit Diagnoses     Screening, lipid       Screen lipid  panel today   Relevant Orders   Lipid panel       Return in about 6 weeks (around 11/16/2022) for 6-8 weeks, elevated blood pressure, thryoid.    Gerre Scull, NP

## 2022-10-05 NOTE — Assessment & Plan Note (Signed)
Enlarged thyroid noted on exam today.  We will check thyroid panel and thyroid ultrasound.

## 2022-10-05 NOTE — Assessment & Plan Note (Signed)
Blood pressure is elevated today at 144/90.  Encouraged him to start checking his blood pressure at home.  Also discussed limiting salt, trying to eat out less frequently, and increasing exercise.  Also discussed smoking cessation.  We will have him follow-up in 6 to 8 weeks.

## 2022-10-05 NOTE — Assessment & Plan Note (Signed)
Overall, his symptoms are doing much better since starting the Qvar inhaler twice a day.  He is using his albuterol inhaler as needed which is not too often.  He is still having significant shortness of breath with activity.  We will place referral to pulmonology with ongoing shortness of breath.  Work note provided.

## 2022-10-05 NOTE — Patient Instructions (Addendum)
It was great to see you!  Try to limit the amount of salt in your diet and increasing fruits and vegetables. Check your blood pressure at home if you are able.   I have referred you to pulmonology for shortness of breath with movement.   We are checking your labs today and will let you know the results via mychart/phone.   We referred you to dermatology:  Dr. Mickel Crow  Address: [address], [place], [place] [address] Phone: [phone]  We referred you to ENT:  Christia Reading [address] [place] [address] [phone]  Let's follow-up in 4 weeks, sooner if you have concerns.  If a referral was placed today, you will be contacted for an appointment. Please note that routine referrals can sometimes take up to 3-4 weeks to process. Please call our office if you haven't heard anything after this time frame.  Take care,  Rodman Pickle, NP

## 2022-10-06 LAB — THYROID PANEL WITH TSH
Free Thyroxine Index: 2.3 (ref 1.4–3.8)
T3 Uptake: 28 % (ref 22–35)
T4, Total: 8.2 ug/dL (ref 4.9–10.5)
TSH: 0.34 mIU/L — ABNORMAL LOW (ref 0.40–4.50)

## 2022-10-06 NOTE — Addendum Note (Signed)
Addended by: Rodman Pickle A on: 10/06/2022 08:28 AM   Modules accepted: Orders

## 2022-10-07 ENCOUNTER — Encounter (HOSPITAL_COMMUNITY): Payer: Self-pay

## 2022-10-07 ENCOUNTER — Ambulatory Visit (HOSPITAL_COMMUNITY)
Admission: EM | Admit: 2022-10-07 | Discharge: 2022-10-07 | Disposition: A | Discharge status: Home / Self Care | Payer: BC Managed Care – PPO | Attending: Emergency Medicine | Admitting: Emergency Medicine

## 2022-10-07 ENCOUNTER — Telehealth: Payer: Self-pay | Admitting: Nurse Practitioner

## 2022-10-07 ENCOUNTER — Encounter: Payer: Self-pay | Admitting: Nurse Practitioner

## 2022-10-07 DIAGNOSIS — Z113 Encounter for screening for infections with a predominantly sexual mode of transmission: Secondary | ICD-10-CM | POA: Insufficient documentation

## 2022-10-07 DIAGNOSIS — Z202 Contact with and (suspected) exposure to infections with a predominantly sexual mode of transmission: Secondary | ICD-10-CM | POA: Diagnosis not present

## 2022-10-07 NOTE — Telephone Encounter (Signed)
LVM to offer work in appt below for 1:20   [11:51 AM] Bralynn, Donado [phone] asking for work in appt for trx of trichomonas and chlamydia.   [11:52 AM] Rodman Pickle he can come at 1:20 If that will work for him, otherwise I can work him in later

## 2022-10-07 NOTE — ED Provider Notes (Signed)
[facility]    CSN: [id number] Arrival date & time: 10/07/22  1757      History   Chief Complaint Chief Complaint  Patient presents with   Exposure to STD    HPI Clemente Dewey is a 31 y.o. male.  Presents for STD testing Denies any symptoms or exposures No penile discharge, testicular pain or swelling, urinary symptoms  Past Medical History:  Diagnosis Date   Allergic rhinitis    Bronchitis    Hx of tobacco use, presenting hazards to health    Marijuana abuse    Severe persistent asthma     Patient Active Problem List   Diagnosis Date Noted   Enlarged thyroid 10/05/2022   Elevated blood pressure reading 10/05/2022   Asthma 09/07/2022   Non compliance with medical treatment 12/12/2016   Sinus tachycardia 09/15/2012   Marijuana abuse    Hx of tobacco use, presenting hazards to health     Past Surgical History:  Procedure Laterality Date   TONSILLECTOMY  2008       Home Medications    Prior to Admission medications   Medication Sig Start Date End Date Taking? Authorizing Provider  albuterol (PROVENTIL) (2.5 MG/3ML) 0.083% nebulizer solution Take 3 mLs (2.5 mg total) by nebulization every 6 (six) hours as needed for wheezing or shortness of breath. 09/07/22   McElwee, Lauren A, NP  albuterol (VENTOLIN HFA) 108 (90 Base) MCG/ACT inhaler Inhale 2 puffs into the lungs every 6 (six) hours as needed for wheezing or shortness of breath. 09/07/22   McElwee, Lauren A, NP  beclomethasone (QVAR REDIHALER) 80 MCG/ACT inhaler Inhale 2 puffs into the lungs 2 (two) times daily. 09/07/22   McElwee, Lauren A, NP  triamcinolone cream (KENALOG) 0.1 % Apply 1 Application topically 2 (two) times daily. 09/07/22   McElwee, Jake Church, NP  promethazine (PHENERGAN) 25 MG tablet Take 1 tablet (25 mg total) by mouth every 6 (six) hours as needed for nausea or vomiting. 09/23/17 12/25/19  Gilda Crease, MD    Family History Family History  Problem Relation Age of Onset    Asthma Mother    Cancer Mother        unsure   Asthma Brother     Social History Social History   Tobacco Use   Smoking status: Never   Smokeless tobacco: Never  Vaping Use   Vaping Use: Never used  Substance Use Topics   Alcohol use: Not Currently   Drug use: Not Currently    Types: Marijuana    Comment: occasionally     Allergies   Patient has no known allergies.   Review of Systems Review of Systems Per HPI  Physical Exam Triage Vital Signs ED Triage Vitals [10/07/22 1910]  Enc Vitals Group     BP (!) 142/92     Pulse Rate 75     Resp 18     Temp 97.9 F (36.6 C)     Temp Source Oral     SpO2 98 %     Weight      Height      Head Circumference      Peak Flow      Pain Score      Pain Loc      Pain Edu?      Excl. in GC?    No data found.  Updated Vital Signs BP (!) 142/92 (BP Location: Left Arm)   Pulse 75   Temp 97.9 F (36.6  C) (Oral)   Resp 18   SpO2 98%   Physical Exam Vitals and nursing note reviewed.  Constitutional:      General: He is not in acute distress.    Appearance: Normal appearance.  HENT:     Mouth/Throat:     Pharynx: Oropharynx is clear.  Cardiovascular:     Rate and Rhythm: Normal rate and regular rhythm.     Pulses: Normal pulses.  Pulmonary:     Effort: Pulmonary effort is normal.  Neurological:     Mental Status: He is alert and oriented to person, place, and time.      UC Treatments / Results  Labs (all labs ordered are listed, but only abnormal results are displayed) Labs Reviewed  CYTOLOGY, (ORAL, ANAL, URETHRAL) ANCILLARY ONLY    EKG   Radiology No results found.  Procedures Procedures (including critical care time)  Medications Ordered in UC Medications - No data to display  Initial Impression / Assessment and Plan / UC Course  I have reviewed the triage vital signs and the nursing notes.  Pertinent labs & imaging results that were available during my care of the patient were  reviewed by me and considered in my medical decision making (see chart for details).  Cytology swab pending.  Will treat positive result as needed No questions or concerns today  Final Clinical Impressions(s) / UC Diagnoses   Final diagnoses:  Screen for STD (sexually transmitted disease)     Discharge Instructions      We will call you if anything on your swab returns positive. Please abstain from sexual intercourse until your results return.    ED Prescriptions   None    PDMP not reviewed this encounter.   Kathrine Haddock 10/07/22 1955

## 2022-10-07 NOTE — ED Triage Notes (Signed)
Pt is here for his monthly STD check-up. Denies any symptoms at this time.

## 2022-10-07 NOTE — Telephone Encounter (Signed)
error 

## 2022-10-07 NOTE — Discharge Instructions (Signed)
We will call you if anything on your swab returns positive. Please abstain from sexual intercourse until your results return. 

## 2022-10-10 LAB — CYTOLOGY, (ORAL, ANAL, URETHRAL) ANCILLARY ONLY
Chlamydia: NEGATIVE
Comment: NEGATIVE
Comment: NEGATIVE
Comment: NORMAL
Neisseria Gonorrhea: NEGATIVE
Trichomonas: POSITIVE — AB

## 2022-10-11 ENCOUNTER — Telehealth (HOSPITAL_COMMUNITY): Payer: Self-pay | Admitting: Emergency Medicine

## 2022-10-11 MED ORDER — METRONIDAZOLE 500 MG PO TABS: 2000.0000 mg | ORAL_TABLET | Freq: Once | ORAL | 0 refills | Status: AC

## 2022-10-18 ENCOUNTER — Ambulatory Visit
Admission: RE | Admit: 2022-10-18 | Discharge: 2022-10-18 | Disposition: A | Discharge status: Home / Self Care | Payer: BC Managed Care – PPO | Source: Ambulatory Visit | Attending: Nurse Practitioner | Admitting: Nurse Practitioner

## 2022-10-18 DIAGNOSIS — E049 Nontoxic goiter, unspecified: Secondary | ICD-10-CM

## 2022-10-19 NOTE — Addendum Note (Signed)
Addended by: Rodman Pickle A on: 10/19/2022 10:05 AM   Modules accepted: Orders

## 2022-10-20 ENCOUNTER — Ambulatory Visit: Payer: BC Managed Care – PPO | Admitting: Pulmonary Disease

## 2022-10-20 ENCOUNTER — Encounter: Payer: Self-pay | Admitting: Pulmonary Disease

## 2022-10-20 VITALS — BP 130/74 | HR 76 | Temp 98.8°F | Ht 64.0 in | Wt 180.0 lb

## 2022-10-20 DIAGNOSIS — J320 Chronic maxillary sinusitis: Secondary | ICD-10-CM

## 2022-10-20 DIAGNOSIS — J4540 Moderate persistent asthma, uncomplicated: Secondary | ICD-10-CM

## 2022-10-20 MED ORDER — BUDESONIDE-FORMOTEROL FUMARATE 160-4.5 MCG/ACT IN AERO: 2.0000 | INHALATION_SPRAY | Freq: Two times a day (BID) | RESPIRATORY_TRACT | 12 refills | Status: DC

## 2022-10-20 NOTE — Progress Notes (Signed)
@Patient  ID: , male    DOB: 11-26-1990, 31 y.o.   MRN: [id number]  Chief Complaint  Patient presents with   Consult    Pt is here for consult for asthma. Pt states he has had asthma since childhood. Pt is Qvar and Albuterol daily. Pt states that the medication does not seem to help all that much. Chest xray was done 08/27/2022    Referring provider: 10/27/2022, NP  HPI:   31 y.o. man whom are seeing in consultation for evaluation of poorly controlled asthma.  Most recent PCP note x2 08/2022 reviewed.  Patient has longstanding history of asthma.  Diagnosed as a child per his report.  Has really struggled over the last few years.  Poorly controlled.  Frequent wheeze, cough, shortness of breath.  Was buying inhalers, albuterol off the streets.  Did not have insurance.  Recently establish insurance with job.  Notices significant shortness of breath with climbing etc.  Needs to rest, use rescue inhaler frequently through the day.  Recently started on Qvar ICS via PCP.  He states this is helped symptoms in general.  Still short of breath but cough and wheeze better.  Shortness of breath has improved as well.  Chest x-ray 08/2022 personally reviewed and interpreted as clear with evidence of mild hyperinflation on the lateral view.  PMH: Asthma Surgical history: Tonsillectomy Family history: Mother with asthma, brother with asthma Social history: Former smoker, lives in 09/2022 / Pulmonary Flowsheets:   ACT:      No data to display          MMRC:     No data to display          Epworth:      No data to display          Tests:   FENO:  No results found for: "NITRICOXIDE"  PFT:     No data to display          WALK:      No data to display          Imaging: Personally reviewed and as per EMR and discussion in this note [employer] THYROID  Result Date: 10/18/2022 CLINICAL DATA:  Enlarged thyroid gland EXAM: THYROID  ULTRASOUND TECHNIQUE: Ultrasound examination of the thyroid gland and adjacent soft tissues was performed. COMPARISON:  None Available. FINDINGS: Parenchymal Echotexture: Mildly heterogenous Isthmus: 2.8 cm Right lobe: 6.6 x 2.8 x 2.3 cm Left lobe: 6.3 x 2.8 x 2.1 cm _________________________________________________________ Estimated total number of nodules >/= 1 cm: 2 Number of spongiform nodules >/=  2 cm not described below (TR1): 0 Number of mixed cystic and solid nodules >/= 1.5 cm not described below (TR2): 0 _________________________________________________________ Nodule labeled 1 is a large solid isoechoic TR 3 nodule in the thyroid isthmus measuring up to 5.0 x 4.8 x 3.0 cm. **Given size (>/= 2.5 cm) and appearance, fine needle aspiration of this mildly suspicious nodule should be considered based on TI-RADS criteria. Nodule labeled 2 is a solid hypoechoic TR 4 nodule along the inferior aspect of the right thyroid lobe that measures 2.0 x 1.5 x 1.1 cm. **Given size (>/= 1.5 cm) and appearance, fine needle aspiration of this moderately suspicious nodule should be considered based on TI-RADS criteria. IMPRESSION: 1. Diffusely enlarged multinodular thyroid gland. 2. Nodule labeled 1 in the thyroid isthmus (5.0 cm TR 3) meets criteria for biopsy. 3. Nodule labeled 2 in the right thyroid  lobe (2.0 cm TR 4) meets criteria for biopsy. The above is in keeping with the ACR TI-RADS recommendations - J Am Coll Radiol 2017;14:587-595. Electronically Signed   By: Olive Bass M.D.   On: 10/18/2022 14:28    Lab Results: Personally reviewed, notably eosinophils 300 09/2022 CBC    Component Value Date/Time   WBC 2.8 (L) 10/05/2022 1054   RBC 5.52 10/05/2022 1054   HGB 15.8 10/05/2022 1054   HCT 46.8 10/05/2022 1054   PLT 236.0 10/05/2022 1054   MCV 84.7 10/05/2022 1054   MCH 28.5 08/27/2022 0729   MCHC 33.8 10/05/2022 1054   RDW 13.3 10/05/2022 1054   LYMPHSABS 0.8 10/05/2022 1054   MONOABS 0.3  10/05/2022 1054   EOSABS 0.3 10/05/2022 1054   BASOSABS 0.0 10/05/2022 1054    BMET    Component Value Date/Time   NA 139 10/05/2022 1054   K 3.7 10/05/2022 1054   CL 105 10/05/2022 1054   CO2 29 10/05/2022 1054   GLUCOSE 117 (H) 10/05/2022 1054   BUN 12 10/05/2022 1054   CREATININE 0.89 10/05/2022 1054   CALCIUM 9.3 10/05/2022 1054   GFRNONAA >60 08/27/2022 0729   GFRAA >60 10/08/2017 1817    BNP No results found for: "BNP"  ProBNP No results found for: "PROBNP"  Specialty Problems       Pulmonary Problems   Asthma    No Known Allergies  Immunization History  Administered Date(s) Administered   Influenza Split 09/16/2012   Pneumococcal Polysaccharide-23 09/16/2012   Tdap 09/07/2022    Past Medical History:  Diagnosis Date   Allergic rhinitis    Bronchitis    Hx of tobacco use, presenting hazards to health    Marijuana abuse    Severe persistent asthma     Tobacco History: Social History   Tobacco Use  Smoking Status Former   Types: Cigarettes  Smokeless Tobacco Never   Counseling given: Not Answered   Continue to not smoke  Outpatient Encounter Medications as of 10/20/2022  Medication Sig   albuterol (PROVENTIL) (2.5 MG/3ML) 0.083% nebulizer solution Take 3 mLs (2.5 mg total) by nebulization every 6 (six) hours as needed for wheezing or shortness of breath.   albuterol (VENTOLIN HFA) 108 (90 Base) MCG/ACT inhaler Inhale 2 puffs into the lungs every 6 (six) hours as needed for wheezing or shortness of breath.   budesonide-formoterol (SYMBICORT) 160-4.5 MCG/ACT inhaler Inhale 2 puffs into the lungs 2 (two) times daily.   triamcinolone cream (KENALOG) 0.1 % Apply 1 Application topically 2 (two) times daily.   [DISCONTINUED] beclomethasone (QVAR REDIHALER) 80 MCG/ACT inhaler Inhale 2 puffs into the lungs 2 (two) times daily.   [DISCONTINUED] promethazine (PHENERGAN) 25 MG tablet Take 1 tablet (25 mg total) by mouth every 6 (six) hours as needed for  nausea or vomiting.   No facility-administered encounter medications on file as of 10/20/2022.     Review of Systems  Review of Systems  No chest pain with exertion.  No orthopnea or PND.  Comprehensive review of systems otherwise negative. Physical Exam  BP 130/74 (BP Location: Left Arm, Patient Position: Sitting, Cuff Size: Normal)   Pulse 76   Temp 98.8 F (37.1 C) (Oral)   Ht 5\' 4"  (1.626 m)   Wt 180 lb (81.6 kg)   SpO2 93%   BMI 30.90 kg/m   Wt Readings from Last 5 Encounters:  10/20/22 180 lb (81.6 kg)  10/05/22 180 lb 6.4 oz (81.8 kg)  09/07/22 171  lb 12.8 oz (77.9 kg)  08/27/22 170 lb (77.1 kg)  06/17/22 160 lb 0.9 oz (72.6 kg)    BMI Readings from Last 5 Encounters:  10/20/22 30.90 kg/m  10/05/22 31.96 kg/m  09/07/22 30.43 kg/m  08/27/22 30.11 kg/m  06/17/22 28.35 kg/m     Physical Exam General: Sitting in chair, no acute distress Eyes: EOMI, no icterus Neck: Supple, no JVP Pulmonary: Distant, clear Cardiovascular: Warm, no edema Abdomen: Nondistended, bowel sounds present MSK: No synovitis, no joint effusion Neuro: Normal gait, no weakness Psych: Normal mood, full affect   Assessment & Plan:   Severe persistent asthma: Unfortunately essentially had no access to medication given lack of insurance.  Multiple exacerbations a year.  Recently established insurance.  Mild improvement in symptoms on Qvar.  Still using rescue inhaler frequently during the day.  Escalate to high-dose Symbicort.  Continue albuterol as needed.  Consider phenotyping in the future if symptoms not well controlled.   Return in about 3 months (around 01/19/2023).   Karren Burly, MD 10/20/2022

## 2022-10-20 NOTE — Patient Instructions (Signed)
Nice to meet you  I recommend a stronger inhaler  Use Symbicort 2 puffs twice a day every day.  Rinse your mouth out with water after every use  Stop using Qvar once you start using Symbicort  Continue to use albuterol as needed  I reprinted the note from your primary care doctor.  If you or your job needs additional documentation please let us know.  Return to clinic in 3 months or sooner as needed with Dr. Judeth Horn

## 2022-10-26 ENCOUNTER — Telehealth: Payer: BC Managed Care – PPO | Admitting: Physician Assistant

## 2022-10-26 DIAGNOSIS — J069 Acute upper respiratory infection, unspecified: Secondary | ICD-10-CM | POA: Diagnosis not present

## 2022-10-26 MED ORDER — BENZONATATE 100 MG PO CAPS: 100.0000 mg | ORAL_CAPSULE | Freq: Three times a day (TID) | ORAL | 0 refills | Status: DC | PRN

## 2022-10-26 NOTE — Patient Instructions (Signed)
Denver Faster, thank you for joining Piedad Climes, PA-C for today's virtual visit.  While this provider is not your primary care provider (PCP), if your PCP is located in our provider database this encounter information will be shared with them immediately following your visit.   A [facility] MyChart account gives you access to today's visit and all your visits, tests, and labs performed at [facility] " click here if you don't have a [facility] MyChart account or go to [email or web address]  Consent: (Patient) Steven [name] provided verbal consent for this virtual visit at the beginning of the encounter.  Current Medications:  Current Outpatient Medications:    albuterol (PROVENTIL) (2.5 MG/3ML) 0.083% nebulizer solution, Take 3 mLs (2.5 mg total) by nebulization every 6 (six) hours as needed for wheezing or shortness of breath., Disp: 75 mL, Rfl: 12   albuterol (VENTOLIN HFA) 108 (90 Base) MCG/ACT inhaler, Inhale 2 puffs into the lungs every 6 (six) hours as needed for wheezing or shortness of breath., Disp: 8 g, Rfl: 2   budesonide-formoterol (SYMBICORT) 160-4.5 MCG/ACT inhaler, Inhale 2 puffs into the lungs 2 (two) times daily., Disp: 1 each, Rfl: 12   triamcinolone cream (KENALOG) 0.1 %, Apply 1 Application topically 2 (two) times daily., Disp: 30 g, Rfl: 1   Medications ordered in this encounter:  No orders of the defined types were placed in this encounter.    *If you need refills on other medications prior to your next appointment, please contact your pharmacy*  Follow-Up: Call back or seek an in-person evaluation if the symptoms worsen or if the condition fails to improve as anticipated.  [facility] [phone]  Other Instructions  Based on what you have shared with me, it looks like you may have a viral upper respiratory infection.  Upper respiratory infections are caused by a large number of viruses; however, rhinovirus is the most  common cause. As discussed, I would home test for COVID to be cautious. Message [place] with results if positive.  Symptoms vary from person to person, with common symptoms including sore throat, cough, fatigue or lack of energy and feeling of general discomfort.  A low-grade fever of up to 100.4 may present, but is often uncommon.  Symptoms vary however, and are closely related to a person's age or underlying illnesses.  The most common symptoms associated with an upper respiratory infection are nasal discharge or congestion, cough, sneezing, headache and pressure in the ears and face.  These symptoms usually persist for about 3 to 10 days, but can last up to 2 weeks.  It is important to know that upper respiratory infections do not cause serious illness or complications in most cases.    Upper respiratory infections can be transmitted from person to person, with the most common method of transmission being a person's hands.  The virus is able to live on the skin and can infect other persons for up to 2 hours after direct contact.  Also, these can be transmitted when someone coughs or sneezes; thus, it is important to cover the mouth to reduce this risk.  To keep the spread of the illness at bay, good hand hygiene is very important.  This is an infection that is most likely caused by a virus. There are no specific treatments other than to help you with the symptoms until the infection runs its course.  We are sorry you are not feeling well.  Here is how we plan to help!  For nasal congestion, you may use an oral decongestants such as Mucinex D or if you have glaucoma or high blood pressure use plain Mucinex.  Saline nasal spray or nasal drops can help and can safely be used as often as needed for congestion.   If you do not have a history of heart disease, hypertension, diabetes or thyroid disease, prostate/bladder issues or glaucoma, you may also use Sudafed to treat nasal congestion.  It is highly  recommended that you consult with a pharmacist or your primary care physician to ensure this medication is safe for you to take.     If you have a cough, you may use cough suppressants such as Delsym and Robitussin.  If you have glaucoma or high blood pressure, you can also use Coricidin HBP.   For cough I have prescribed for you A prescription cough medication called Tessalon Perles 100 mg. You may take 1-2 capsules every 8 hours as needed for cough  If you have a sore or scratchy throat, use a saltwater gargle-  to  teaspoon of salt dissolved in a 4-ounce to 8-ounce glass of warm water.  Gargle the solution for approximately 15-30 seconds and then spit.  It is important not to swallow the solution.  You can also use throat lozenges/cough drops and Chloraseptic spray to help with throat pain or discomfort.  Warm or cold liquids can also be helpful in relieving throat pain.  For headache, pain or general discomfort, you can use Ibuprofen or Tylenol as directed.   Some authorities believe that zinc sprays or the use of Echinacea may shorten the course of your symptoms.  I have sent a work note to Pharmacologist. You can find by going to the Menu on your homepage, scrolling down to the Communications section, and selecting Letters. Let us know if you have any issue locating. Take care and feel better soon!   HOME CARE Only take medications as instructed by your medical team. Be sure to drink plenty of fluids. Water is fine as well as fruit juices, sodas and electrolyte beverages. You may want to stay away from caffeine or alcohol. If you are nauseated, try taking small sips of liquids. How do you know if you are getting enough fluid? Your urine should be a pale yellow or almost colorless. Get rest. Taking a steamy shower or using a humidifier may help nasal congestion and ease sore throat pain. You can place a towel over your head and breathe in the steam from hot water coming from a faucet. Using a  saline nasal spray works much the same way. Cough drops, hard candies and sore throat lozenges may ease your cough. Avoid close contacts especially the very young and the elderly Cover your mouth if you cough or sneeze Always remember to wash your hands.   GET HELP RIGHT AWAY IF: You develop worsening fever. If your symptoms do not improve within 10 days You develop yellow or green discharge from your nose over 3 days. You have coughing fits You develop a severe head ache or visual changes. You develop shortness of breath, difficulty breathing or start having chest pain Your symptoms persist after you have completed your treatment plan  MAKE SURE YOU  Understand these instructions. Will watch your condition. Will get help right away if you are not doing well or get worse.        If you have been instructed to have an in-person evaluation today at a local Urgent Care  facility, please use the link below. It will take you to a list of all of our available [facility], including address, phone number and hours of operation. Please do not delay care.  [facility]  If you or a family member do not have a primary care provider, use the link below to schedule a visit and establish care. When you choose a [facility] primary care physician or advanced practice provider, you gain a long-term partner in health. Find a Primary Care Provider  Learn more about [facility]'s in-office and virtual care options: [facility]

## 2022-10-26 NOTE — Progress Notes (Signed)
Virtual Visit Consent   [name], you are scheduled for a virtual visit with a [facility] provider today. Just as with appointments in the office, your consent must be obtained to participate. Your consent will be active for this visit and any virtual visit you may have with one of our providers in the next 365 days. If you have a MyChart account, a copy of this consent can be sent to you electronically.  As this is a virtual visit, video technology does not allow for your provider to perform a traditional examination. This may limit your provider's ability to fully assess your condition. If your provider identifies any concerns that need to be evaluated in person or the need to arrange testing (such as labs, EKG, etc.), we will make arrangements to do so. Although advances in technology are sophisticated, we cannot ensure that it will always work on either your end or our end. If the connection with a video visit is poor, the visit may have to be switched to a telephone visit. With either a video or telephone visit, we are not always able to ensure that we have a secure connection.  By engaging in this virtual visit, you consent to the provision of healthcare and authorize for your insurance to be billed (if applicable) for the services provided during this visit. Depending on your insurance coverage, you may receive a charge related to this service.  I need to obtain your verbal consent now. Are you willing to proceed with your visit today? Steven [name] has provided verbal consent on 10/26/2022 for a virtual visit (video or telephone). Piedad Climes, [place]  Date: 10/26/2022 3:18 PM  Virtual Visit via Video Note   I, Piedad Climes, connected with  Steven [name]  ([id number], March 08, 1991) on 10/26/22 at  3:00 PM EST by a video-enabled telemedicine application and verified that I am speaking with the correct person using two identifiers.  Location: Patient: Virtual Visit Location  Patient: Home Provider: Virtual Visit Location Provider: Home Office   I discussed the limitations of evaluation and management by telemedicine and the availability of in person appointments. The patient expressed understanding and agreed to proceed.    History of Present Illness: Steven [name] is a 31 y.o. who identifies as a male who was assigned male at birth, and is being seen today for 1.5 days of nasal congestion, drainage and cough. Unsure of fever. Denies recent travel or sick contact. Denies sinus pain or tooth pain. Denies SOB above baseline asthma.   HPI: HPI  Problems:  Patient Active Problem List   Diagnosis Date Noted   Enlarged thyroid 10/05/2022   Elevated blood pressure reading 10/05/2022   Asthma 09/07/2022   Non compliance with medical treatment 12/12/2016   Sinus tachycardia 09/15/2012   Marijuana abuse    Hx of tobacco use, presenting hazards to health     Allergies: No Known Allergies Medications:  Current Outpatient Medications:    benzonatate (TESSALON) 100 MG capsule, Take 1 capsule (100 mg total) by mouth 3 (three) times daily as needed for cough., Disp: 30 capsule, Rfl: 0   albuterol (PROVENTIL) (2.5 MG/3ML) 0.083% nebulizer solution, Take 3 mLs (2.5 mg total) by nebulization every 6 (six) hours as needed for wheezing or shortness of breath., Disp: 75 mL, Rfl: 12   albuterol (VENTOLIN HFA) 108 (90 Base) MCG/ACT inhaler, Inhale 2 puffs into the lungs every 6 (six) hours as needed for wheezing or shortness of breath., Disp: 8 g, Rfl:  2   budesonide-formoterol (SYMBICORT) 160-4.5 MCG/ACT inhaler, Inhale 2 puffs into the lungs 2 (two) times daily., Disp: 1 each, Rfl: 12   triamcinolone cream (KENALOG) 0.1 %, Apply 1 Application topically 2 (two) times daily., Disp: 30 g, Rfl: 1  Observations/Objective: Patient is well-developed, well-nourished in no acute distress.  Resting comfortably at home.  Head is normocephalic, atraumatic.  No labored breathing. Speech  is clear and coherent with logical content.  Patient is alert and oriented at baseline.   Assessment and Plan: 1. Viral URI with cough - benzonatate (TESSALON) 100 MG capsule; Take 1 capsule (100 mg total) by mouth 3 (three) times daily as needed for cough.  Dispense: 30 capsule; Refill: 0  Will have him home test for COVID to be cautious. He is to let us know through MyChart if positive. Increase fluids, rest. OTC medications reviewed. Tessalon per orders. Work note provided.  Follow Up Instructions: I discussed the assessment and treatment plan with the patient. The patient was provided an opportunity to ask questions and all were answered. The patient agreed with the plan and demonstrated an understanding of the instructions.  A copy of instructions were sent to the patient via MyChart unless otherwise noted below.   The patient was advised to call back or seek an in-person evaluation if the symptoms worsen or if the condition fails to improve as anticipated.  Time:  I spent 10 minutes with the patient via telehealth technology discussing the above problems/concerns.    Piedad Climes, PA-C

## 2022-10-27 ENCOUNTER — Encounter (HOSPITAL_COMMUNITY): Payer: Self-pay

## 2022-10-27 ENCOUNTER — Ambulatory Visit (HOSPITAL_COMMUNITY)
Admission: EM | Admit: 2022-10-27 | Discharge: 2022-10-27 | Disposition: A | Discharge status: Home / Self Care | Payer: BC Managed Care – PPO | Attending: Internal Medicine | Admitting: Internal Medicine

## 2022-10-27 DIAGNOSIS — Z1152 Encounter for screening for COVID-19: Secondary | ICD-10-CM | POA: Diagnosis not present

## 2022-10-27 DIAGNOSIS — R0981 Nasal congestion: Secondary | ICD-10-CM | POA: Diagnosis not present

## 2022-10-27 DIAGNOSIS — R6883 Chills (without fever): Secondary | ICD-10-CM | POA: Insufficient documentation

## 2022-10-27 DIAGNOSIS — J069 Acute upper respiratory infection, unspecified: Secondary | ICD-10-CM | POA: Diagnosis not present

## 2022-10-27 DIAGNOSIS — J40 Bronchitis, not specified as acute or chronic: Secondary | ICD-10-CM | POA: Diagnosis present

## 2022-10-27 LAB — RESP PANEL BY RT-PCR (FLU A&B, COVID) ARPGX2
Influenza A by PCR: POSITIVE — AB
Influenza B by PCR: NEGATIVE
SARS Coronavirus 2 by RT PCR: NEGATIVE

## 2022-10-27 MED ORDER — IPRATROPIUM-ALBUTEROL 0.5-2.5 (3) MG/3ML IN SOLN
3.0000 mL | Freq: Once | RESPIRATORY_TRACT | Status: AC
  Administered 2022-10-27: 3 mL via RESPIRATORY_TRACT

## 2022-10-27 MED ORDER — PREDNISONE 20 MG PO TABS: 40.0000 mg | ORAL_TABLET | Freq: Every day | ORAL | 0 refills | Status: AC

## 2022-10-27 MED ORDER — ACETAMINOPHEN 325 MG PO TABS
975.0000 mg | ORAL_TABLET | Freq: Once | ORAL | Status: AC
  Administered 2022-10-27: 975 mg via ORAL

## 2022-10-27 MED ORDER — PROMETHAZINE-DM 6.25-15 MG/5ML PO SYRP: 5.0000 mL | ORAL_SOLUTION | Freq: Every evening | ORAL | 0 refills | Status: DC | PRN

## 2022-10-27 MED ORDER — ACETAMINOPHEN 325 MG PO TABS
ORAL_TABLET | ORAL | Status: AC
  Filled 2022-10-27: qty 3

## 2022-10-27 MED ORDER — IPRATROPIUM-ALBUTEROL 0.5-2.5 (3) MG/3ML IN SOLN
RESPIRATORY_TRACT | Status: AC
  Filled 2022-10-27: qty 3

## 2022-10-27 NOTE — ED Triage Notes (Signed)
Pt states cough,body aches,sob,congestion for the past 4 days.

## 2022-10-27 NOTE — ED Provider Notes (Signed)
[facility]    CSN: [id number] Arrival date & time: 10/27/22  0801      History   Chief Complaint Chief Complaint  Patient presents with   Cough    HPI Steven [name] is a 31 y.o. male.   Patient presents to urgent care for evaluation of cough, nasal congestion, chills, body aches, and headache for the last 4 days since Monday October 24, 2022. No documented fever at home, reports chills. History of asthma, has been using albuterol and Symbicort at home without relief of shortness of breath and cough. Former cigarette smoker, denies other drug use. Cough is significant and sometimes productive, worse at nighttime. No known sick contacts. No recent antibiotics or steroid use. Denies chest pain, heart palpitations, nausea, vomiting, dizziness, diarrhea, abdominal pain, urinary symptoms, and leg swelling. Did an E-Visit yesterday where he was prescribed tessalon pearles, but these have not helped. Cough has been getting worse. Has been using other cold and flu OTC remedies/medications prior to arrival at urgent care without relief.    Cough   Past Medical History:  Diagnosis Date   Allergic rhinitis    Bronchitis    Hx of tobacco use, presenting hazards to health    Marijuana abuse    Severe persistent asthma     Patient Active Problem List   Diagnosis Date Noted   Enlarged thyroid 10/05/2022   Elevated blood pressure reading 10/05/2022   Asthma 09/07/2022   Non compliance with medical treatment 12/12/2016   Sinus tachycardia 09/15/2012   Marijuana abuse    Hx of tobacco use, presenting hazards to health     Past Surgical History:  Procedure Laterality Date   TONSILLECTOMY  2008       Home Medications    Prior to Admission medications   Medication Sig Start Date End Date Taking? Authorizing Provider  predniSONE (DELTASONE) 20 MG tablet Take 2 tablets (40 mg total) by mouth daily for 5 days. 10/27/22 11/01/22 Yes Carlisle Beers, FNP   promethazine-dextromethorphan (PROMETHAZINE-DM) 6.25-15 MG/5ML syrup Take 5 mLs by mouth at bedtime as needed for cough. 10/27/22  Yes Carlisle Beers, FNP  albuterol (PROVENTIL) (2.5 MG/3ML) 0.083% nebulizer solution Take 3 mLs (2.5 mg total) by nebulization every 6 (six) hours as needed for wheezing or shortness of breath. 09/07/22   McElwee, Lauren A, NP  albuterol (VENTOLIN HFA) 108 (90 Base) MCG/ACT inhaler Inhale 2 puffs into the lungs every 6 (six) hours as needed for wheezing or shortness of breath. 09/07/22   McElwee, Lauren A, NP  benzonatate (TESSALON) 100 MG capsule Take 1 capsule (100 mg total) by mouth 3 (three) times daily as needed for cough. 10/26/22   Waldon Merl, PA-C  budesonide-formoterol [facility]) 160-4.5 MCG/ACT inhaler Inhale 2 puffs into the lungs 2 (two) times daily. 10/20/22   Hunsucker, Lesia Sago, MD  triamcinolone cream (KENALOG) 0.1 % Apply 1 Application topically 2 (two) times daily. 09/07/22   McElwee, Jake Church, NP  promethazine (PHENERGAN) 25 MG tablet Take 1 tablet (25 mg total) by mouth every 6 (six) hours as needed for nausea or vomiting. 09/23/17 12/25/19  Gilda Crease, MD    Family History Family History  Problem Relation Age of Onset   Asthma Mother    Cancer Mother        unsure   Asthma Brother     Social History Social History   Tobacco Use   Smoking status: Former    Types: Cigarettes  Smokeless tobacco: Never  Vaping Use   Vaping Use: Never used  Substance Use Topics   Alcohol use: Not Currently   Drug use: Not Currently    Types: Marijuana    Comment: occasionally     Allergies   Patient has no known allergies.   Review of Systems Review of Systems  Respiratory:  Positive for cough.   Per HPI   Physical Exam Triage Vital Signs ED Triage Vitals  Enc Vitals Group     BP 10/27/22 0814 114/80     Pulse Rate 10/27/22 0814 96     Resp 10/27/22 0814 16     Temp 10/27/22 0814 99.4 F (37.4 C)     Temp  Source 10/27/22 0814 Oral     SpO2 10/27/22 0814 100 %     Weight --      Height --      Head Circumference --      Peak Flow --      Pain Score 10/27/22 0816 6     Pain Loc --      Pain Edu? --      Excl. in GC? --    No data found.  Updated Vital Signs BP 114/80 (BP Location: Right Arm)   Pulse 96   Temp 99.4 F (37.4 C) (Oral)   Resp 16   SpO2 100%   Visual Acuity Right Eye Distance:   Left Eye Distance:   Bilateral Distance:    Right Eye Near:   Left Eye Near:    Bilateral Near:     Physical Exam Vitals and nursing note reviewed.  Constitutional:      Appearance: He is ill-appearing. He is not toxic-appearing.  HENT:     Head: Normocephalic and atraumatic.     Right Ear: Hearing, tympanic membrane, ear canal and external ear normal.     Left Ear: Hearing, tympanic membrane, ear canal and external ear normal.     Nose: Congestion present.     Mouth/Throat:     Lips: Pink.     Mouth: Mucous membranes are moist.     Pharynx: Posterior oropharyngeal erythema present.     Comments: Moderate amount of clear/purulent postnasal drainage visualized to the posterior oropharynx.  Eyes:     General: Lids are normal. Vision grossly intact. Gaze aligned appropriately.        Right eye: No discharge.        Left eye: No discharge.     Extraocular Movements: Extraocular movements intact.     Conjunctiva/sclera: Conjunctivae normal.     Pupils: Pupils are equal, round, and reactive to light.  Cardiovascular:     Rate and Rhythm: Normal rate and regular rhythm.     Heart sounds: Normal heart sounds, S1 normal and S2 normal.  Pulmonary:     Effort: Pulmonary effort is normal. No respiratory distress.     Breath sounds: Normal air entry. Wheezing present.     Comments: Significant diffuse inspiratory and expiratory wheezing to all lung fields. No respiratory distress, prolonged expiration, or retractions. Speaking in full sentences without difficulty. Musculoskeletal:      Cervical back: Neck supple.  Lymphadenopathy:     Cervical: Cervical adenopathy present.  Skin:    General: Skin is warm and dry.     Capillary Refill: Capillary refill takes less than 2 seconds.     Findings: No rash.  Neurological:     General: No focal deficit present.  Mental Status: He is alert and oriented to person, place, and time. Mental status is at baseline.     Cranial Nerves: No dysarthria or facial asymmetry.  Psychiatric:        Mood and Affect: Mood normal.        Speech: Speech normal.        Behavior: Behavior normal.        Thought Content: Thought content normal.        Judgment: Judgment normal.      UC Treatments / Results  Labs (all labs ordered are listed, but only abnormal results are displayed) Labs Reviewed  RESP PANEL BY RT-PCR (FLU A&B, COVID) ARPGX2    EKG   Radiology No results found.  Procedures Procedures (including critical care time)  Medications Ordered in UC Medications  ipratropium-albuterol (DUONEB) 0.5-2.5 (3) MG/3ML nebulizer solution 3 mL (3 mLs Nebulization Given 10/27/22 0842)  acetaminophen (TYLENOL) tablet 975 mg (975 mg Oral Given 10/27/22 0842)    Initial Impression / Assessment and Plan / UC Course  I have reviewed the triage vital signs and the nursing notes.  Pertinent labs & imaging results that were available during my care of the patient were reviewed by me and considered in my medical decision making (see chart for details).   Viral URI with cough Symptoms and physical exam consistent with a viral upper respiratory tract infection that will likely resolve with rest, fluids, and prescriptions for symptomatic relief. No indication for imaging today based on stable cardiopulmonary exam and hemodynamically stable vital signs. COVID-19 and influenza PCR testing is pending.  We will call patient if this is positive.  Quarantine guidelines discussed. Currently on day 4 of symptoms and does qualify for antiviral therapy.    Patient given DuoNeb and tylenol in clinic today for cough, wheezing, and low grade temp. Significant improvement in cardiopulmonary exam findings after DuoNeb, patient reports significant subjective relief in shortness of breath as well.   Promethazine DM and Prednisone 40mg  5 day burst sent to pharmacy for symptomatic relief to be taken as prescribed. No NSAIDS with prednisone, he voices understanding.   Promethazine DM cough medication may be used as needed only at bedtime due to possible drowsiness side effect (no alcohol, working, or driving while taking this advised).   May use tylenol over the counter for body aches, fever/chills, and overall discomfort associated with viral illness. May use ibuprofen once done with prednisone burst. Nonpharmacologic interventions for symptom relief provided and after visit summary below.   Strict ED/urgent care return precautions given.  Patient verbalizes understanding and agreement with plan.  Counseled patient regarding possible side effects and uses of all medications prescribed at today's visit.  Patient verbalizes understanding and agreement with plan.  All questions answered.  Patient discharged from urgent care in stable condition.       Final Clinical Impressions(s) / UC Diagnoses   Final diagnoses:  Bronchitis  Viral URI with cough  Chills  Nasal congestion     Discharge Instructions      You have bronchitis which is inflammation of the upper airways in your lungs.  19 and flu testing is pending.  Will call you if your result is positive.  Please stay at home until day 6 of symptoms, then you may return to public but must wear a mask until day 11 of symptoms.  Your work note was at the end of your packet.  Take prednisone once daily for the next 5 days with  breakfast starting tomorrow morning.  Do not take any other NSAID containing medications such as ibuprofen or naproxen/Aleve while taking prednisone. You may take 1000 mg of  Tylenol every 6 hours as needed for fever and chills while you are taking prednisone.  You may use albuterol inhaler 1 to 2 puffs every 4-6 hours as needed for cough, shortness of breath, and wheezing.  You may use Promethazine DM cough syrup at bedtime as needed.  Do not use this during the day, if you go to work, or drink alcohol when using this as it can make you very sleepy.   Purchase Mucinex over-the-counter and take this every 12 hours to help with your nasal congestion and cough.  This will help to thin your mucus so that you are able to get it out of your body by coughing and blowing your nose easier.  If you develop any new or worsening symptoms or do not improve in the next 2 to 3 days, please return.  If your symptoms are severe, please go to the emergency room.  Follow-up with your primary care provider for further evaluation and management of your symptoms as well as ongoing wellness visits.  I hope you feel better!   ED Prescriptions     Medication Sig Dispense Auth. Provider   predniSONE (DELTASONE) 20 MG tablet Take 2 tablets (40 mg total) by mouth daily for 5 days. 10 tablet Carlisle Beers, FNP   promethazine-dextromethorphan (PROMETHAZINE-DM) 6.25-15 MG/5ML syrup Take 5 mLs by mouth at bedtime as needed for cough. 118 mL Carlisle Beers, FNP      PDMP not reviewed this encounter.   Reita May [place], [place] 10/29/22 [phone]

## 2022-10-27 NOTE — Discharge Instructions (Addendum)
You have bronchitis which is inflammation of the upper airways in your lungs.  19 and flu testing is pending.  Will call you if your result is positive.  Please stay at home until day 6 of symptoms, then you may return to public but must wear a mask until day 11 of symptoms.  Your work note was at the end of your packet.  Take prednisone once daily for the next 5 days with breakfast starting tomorrow morning.  Do not take any other NSAID containing medications such as ibuprofen or naproxen/Aleve while taking prednisone. You may take 1000 mg of Tylenol every 6 hours as needed for fever and chills while you are taking prednisone.  You may use albuterol inhaler 1 to 2 puffs every 4-6 hours as needed for cough, shortness of breath, and wheezing.  You may use Promethazine DM cough syrup at bedtime as needed.  Do not use this during the day, if you go to work, or drink alcohol when using this as it can make you very sleepy.   Purchase Mucinex over-the-counter and take this every 12 hours to help with your nasal congestion and cough.  This will help to thin your mucus so that you are able to get it out of your body by coughing and blowing your nose easier.  If you develop any new or worsening symptoms or do not improve in the next 2 to 3 days, please return.  If your symptoms are severe, please go to the emergency room.  Follow-up with your primary care provider for further evaluation and management of your symptoms as well as ongoing wellness visits.  I hope you feel better!

## 2022-11-24 ENCOUNTER — Ambulatory Visit: Payer: BC Managed Care – PPO | Admitting: Nurse Practitioner

## 2022-11-24 ENCOUNTER — Encounter: Payer: Self-pay | Admitting: Nurse Practitioner

## 2022-11-24 VITALS — BP 136/84 | HR 74 | Temp 96.9°F | Ht 64.0 in | Wt 178.4 lb

## 2022-11-24 DIAGNOSIS — R030 Elevated blood-pressure reading, without diagnosis of hypertension: Secondary | ICD-10-CM | POA: Diagnosis not present

## 2022-11-24 DIAGNOSIS — J4550 Severe persistent asthma, uncomplicated: Secondary | ICD-10-CM

## 2022-11-24 NOTE — Assessment & Plan Note (Signed)
Educated him to stop using the qvar and start using symbicort inhaler 2 puffs twice a day, every day. He can still use the albuterol inhaler as needed. He is to follow-up with pulmonology in 3 months.

## 2022-11-24 NOTE — Assessment & Plan Note (Signed)
Blood pressure has improved today and is 136/84. Encouraged him to continue limiting salt in his diet and to start exercising when able.

## 2022-11-24 NOTE — Progress Notes (Signed)
   Established Patient Office Visit  Subjective   Patient ID: Steven [name], male    DOB: 1990-12-31  Age: 32 y.o. MRN: [id number]  Chief Complaint  Patient presents with   Follow-up    6 week follow up  for high blood pressure  , pt hasn't been checking it, pt needs refills on his inhaler     HPI  Steven [name] is here to follow-up on elevated blood pressure and asthma.   He states that he has been trying to limit salt in his diet. He has not been able to start exercising yet. He checked his blood pressure once at home and it was normal. He denies chest pain and shortness of breath.   He saw a pulmonologist and was switched to symbicort from qvar. He states that he has the new inhaler, but has still been using the qvar. He is still needing to use his albuterol inhaler at least daily. He denies shortness of breath and wheezing right now.     ROS See pertinent positives and negatives per HPI.    Objective:     BP 136/84   Pulse 74   Temp (!) 96.9 F (36.1 C)   Ht 5\' 4"  (1.626 m)   Wt 178 lb 6.4 oz (80.9 kg)   SpO2 96%   BMI 30.62 kg/m  BP Readings from Last 3 Encounters:  11/24/22 136/84  10/27/22 114/80  10/20/22 130/74   Wt Readings from Last 3 Encounters:  11/24/22 178 lb 6.4 oz (80.9 kg)  10/20/22 180 lb (81.6 kg)  10/05/22 180 lb 6.4 oz (81.8 kg)      Physical Exam Vitals and nursing note reviewed.  Constitutional:      Appearance: Normal appearance.  HENT:     Head: Normocephalic.  Eyes:     Conjunctiva/sclera: Conjunctivae normal.  Cardiovascular:     Rate and Rhythm: Normal rate and regular rhythm.     Pulses: Normal pulses.     Heart sounds: Normal heart sounds.  Pulmonary:     Effort: Pulmonary effort is normal.     Breath sounds: Normal breath sounds.  Musculoskeletal:     Cervical back: Normal range of motion.  Skin:    General: Skin is warm.  Neurological:     General: No focal deficit present.     Mental Status: He is alert and oriented  to person, place, and time.  Psychiatric:        Mood and Affect: Mood normal.        Behavior: Behavior normal.        Thought Content: Thought content normal.        Judgment: Judgment normal.      Assessment & Plan:   Problem List Items Addressed This Visit       Respiratory   Asthma    Educated him to stop using the qvar and start using symbicort inhaler 2 puffs twice a day, every day. He can still use the albuterol inhaler as needed. He is to follow-up with pulmonology in 3 months.         Other   Elevated blood pressure reading - Primary    Blood pressure has improved today and is 136/84. Encouraged him to continue limiting salt in his diet and to start exercising when able.        Return in about 3 months (around 02/23/2023) for asthma, thryoid biopsy follow-up.    Charyl Dancer, NP

## 2022-11-24 NOTE — Patient Instructions (Signed)
It was great to see you!  Keep taking the symbicort inhaler twice a day and using your albuterol as needed. Stop using the QVAR.   Keep up the great work limiting your salt.   You have your thryoid biopsy next week on 12/01/22 at 1pm.   Let's follow-up in 3 months, sooner if you have concerns.  If a referral was placed today, you will be contacted for an appointment. Please note that routine referrals can sometimes take up to 3-4 weeks to process. Please call our office if you haven't heard anything after this time frame.  Take care,  Vance Peper, NP

## 2022-12-01 ENCOUNTER — Other Ambulatory Visit (HOSPITAL_COMMUNITY)
Admission: RE | Admit: 2022-12-01 | Discharge: 2022-12-01 | Disposition: A | Discharge status: Home / Self Care | Payer: BC Managed Care – PPO | Source: Ambulatory Visit | Attending: Nurse Practitioner | Admitting: Nurse Practitioner

## 2022-12-01 ENCOUNTER — Telehealth: Payer: BC Managed Care – PPO

## 2022-12-01 ENCOUNTER — Ambulatory Visit
Admission: RE | Admit: 2022-12-01 | Discharge: 2022-12-01 | Disposition: A | Discharge status: Home / Self Care | Payer: BC Managed Care – PPO | Source: Ambulatory Visit | Attending: Nurse Practitioner | Admitting: Nurse Practitioner

## 2022-12-01 ENCOUNTER — Telehealth: Payer: BC Managed Care – PPO | Admitting: Nurse Practitioner

## 2022-12-01 DIAGNOSIS — E049 Nontoxic goiter, unspecified: Secondary | ICD-10-CM | POA: Diagnosis not present

## 2022-12-01 DIAGNOSIS — E042 Nontoxic multinodular goiter: Secondary | ICD-10-CM

## 2022-12-01 NOTE — Progress Notes (Signed)
Virtual Visit Consent   Steven [name], you are scheduled for a virtual visit with a [facility] provider today. Just as with appointments in the office, your consent must be obtained to participate. Your consent will be active for this visit and any virtual visit you may have with one of our providers in the next 365 days. If you have a MyChart account, a copy of this consent can be sent to you electronically.  As this is a virtual visit, video technology does not allow for your provider to perform a traditional examination. This may limit your provider's ability to fully assess your condition. If your provider identifies any concerns that need to be evaluated in person or the need to arrange testing (such as labs, EKG, etc.), we will make arrangements to do so. Although advances in technology are sophisticated, we cannot ensure that it will always work on either your end or our end. If the connection with a video visit is poor, the visit may have to be switched to a telephone visit. With either a video or telephone visit, we are not always able to ensure that we have a secure connection.  By engaging in this virtual visit, you consent to the provision of healthcare and authorize for your insurance to be billed (if applicable) for the services provided during this visit. Depending on your insurance coverage, you may receive a charge related to this service.  I need to obtain your verbal consent now. Are you willing to proceed with your visit today? Steven [name] has provided verbal consent on 12/01/2022 for a virtual visit (video or telephone). Steven Pounds, NP  Date: 12/01/2022 7:13 PM  Virtual Visit via Video Note   I, Steven [name], connected with  Steven [name]  ([id number], 11-22-1990) on 12/01/22 at  7:15 PM EST by a video-enabled telemedicine application and verified that I am speaking with the correct person using two identifiers.  Location: Patient: Virtual Visit Location Patient:  Home Provider: Virtual Visit Location Provider: Home Office   I discussed the limitations of evaluation and management by telemedicine and the availability of in person appointments. The patient expressed understanding and agreed to proceed.    History of Present Illness: Steven [name] is a 32 y.o. who identifies as a male who was assigned male at birth, and is being seen today for work note.  [name] had a thyroid biopsy performed today and is requesting a work note to be off tomorrow. Still with pain in neck and will not be able to perform job duties tomorrow.    Problems:  Patient Active Problem List   Diagnosis Date Noted   Enlarged thyroid 10/05/2022   Elevated blood pressure reading 10/05/2022   Asthma 09/07/2022   Non compliance with medical treatment 12/12/2016   Sinus tachycardia 09/15/2012   Marijuana abuse    Hx of tobacco use, presenting hazards to health     Allergies: No Known Allergies Medications:  Current Outpatient Medications:    albuterol (PROVENTIL) (2.5 MG/3ML) 0.083% nebulizer solution, Take 3 mLs (2.5 mg total) by nebulization every 6 (six) hours as needed for wheezing or shortness of breath., Disp: 75 mL, Rfl: 12   albuterol (VENTOLIN HFA) 108 (90 Base) MCG/ACT inhaler, Inhale 2 puffs into the lungs every 6 (six) hours as needed for wheezing or shortness of breath., Disp: 8 g, Rfl: 2   budesonide-formoterol (SYMBICORT) 160-4.5 MCG/ACT inhaler, Inhale 2 puffs into the lungs 2 (two) times daily., Disp: 1 each, Rfl:  12   promethazine-dextromethorphan (PROMETHAZINE-DM) 6.25-15 MG/5ML syrup, Take 5 mLs by mouth at bedtime as needed for cough. (Patient not taking: Reported on 11/24/2022), Disp: 118 mL, Rfl: 0   triamcinolone cream (KENALOG) 0.1 %, Apply 1 Application topically 2 (two) times daily. (Patient not taking: Reported on 11/24/2022), Disp: 30 g, Rfl: 1  Observations/Objective: Patient is well-developed, well-nourished in no acute distress.  Resting  comfortably at home.  Head is normocephalic, atraumatic.  No labored breathing.  Speech is clear and coherent with logical content.  Patient is alert and oriented at baseline.    Assessment and Plan: 1. Enlarged thyroid Work note placed in [place].  Follow Up Instructions: I discussed the assessment and treatment plan with the patient. The patient was provided an opportunity to ask questions and all were answered. The patient agreed with the plan and demonstrated an understanding of the instructions.  A copy of instructions were sent to the patient via MyChart unless otherwise noted below.    The patient was advised to call back or seek an in-person evaluation if the symptoms worsen or if the condition fails to improve as anticipated.  Time:  I spent 11 minutes with the patient via telehealth technology discussing the above problems/concerns.    Steven Pounds, NP

## 2022-12-01 NOTE — Patient Instructions (Signed)
  Reginia Forts, thank you for joining Gildardo Pounds, NP for today's virtual visit.  While this provider is not your primary care provider (PCP), if your PCP is located in our provider database this encounter information will be shared with them immediately following your visit.   [facility] account gives you access to today's visit and all your visits, tests, and labs performed at [facility] " click here if you don't have a [facility] account or go to [email or web address]  Consent: (Patient) Steven [name] provided verbal consent for this virtual visit at the beginning of the encounter.  Current Medications:  Current Outpatient Medications:    albuterol (PROVENTIL) (2.5 MG/3ML) 0.083% nebulizer solution, Take 3 mLs (2.5 mg total) by nebulization every 6 (six) hours as needed for wheezing or shortness of breath., Disp: 75 mL, Rfl: 12   albuterol (VENTOLIN HFA) 108 (90 Base) MCG/ACT inhaler, Inhale 2 puffs into the lungs every 6 (six) hours as needed for wheezing or shortness of breath., Disp: 8 g, Rfl: 2   budesonide-formoterol (SYMBICORT) 160-4.5 MCG/ACT inhaler, Inhale 2 puffs into the lungs 2 (two) times daily., Disp: 1 each, Rfl: 12   promethazine-dextromethorphan (PROMETHAZINE-DM) 6.25-15 MG/5ML syrup, Take 5 mLs by mouth at bedtime as needed for cough. (Patient not taking: Reported on 11/24/2022), Disp: 118 mL, Rfl: 0   triamcinolone cream (KENALOG) 0.1 %, Apply 1 Application topically 2 (two) times daily. (Patient not taking: Reported on 11/24/2022), Disp: 30 g, Rfl: 1   Medications ordered in this encounter:  No orders of the defined types were placed in this encounter.    *If you need refills on other medications prior to your next appointment, please contact your pharmacy*  Follow-Up: Call back or seek an in-person evaluation if the symptoms worsen or if the condition fails to improve as anticipated.  [facility] [phone]  Other  Instructions Work note placed in [place]   If you have been instructed to have an in-person evaluation today at a local Urgent Care facility, please use the link below. It will take you to a list of all of our available [facility], including address, phone number and hours of operation. Please do not delay care.  [facility]  If you or a family member do not have a primary care provider, use the link below to schedule a visit and establish care. When you choose a [facility] primary care physician or advanced practice provider, you gain a long-term partner in health. Find a Primary Care Provider  Learn more about [facility]'s in-office and virtual care options: [facility]

## 2022-12-05 LAB — CYTOLOGY - NON PAP

## 2022-12-07 LAB — CYTOLOGY - NON PAP

## 2022-12-19 ENCOUNTER — Telehealth: Payer: BC Managed Care – PPO | Admitting: Family Medicine

## 2022-12-19 DIAGNOSIS — R6889 Other general symptoms and signs: Secondary | ICD-10-CM

## 2022-12-19 NOTE — Progress Notes (Signed)
Virtual Visit Consent   Steven [name], you are scheduled for a virtual visit with a [facility] provider today. Just as with appointments in the office, your consent must be obtained to participate. Your consent will be active for this visit and any virtual visit you may have with one of our providers in the next 365 days. If you have a MyChart account, a copy of this consent can be sent to you electronically.  As this is a virtual visit, video technology does not allow for your provider to perform a traditional examination. This may limit your provider's ability to fully assess your condition. If your provider identifies any concerns that need to be evaluated in person or the need to arrange testing (such as labs, EKG, etc.), we will make arrangements to do so. Although advances in technology are sophisticated, we cannot ensure that it will always work on either your end or our end. If the connection with a video visit is poor, the visit may have to be switched to a telephone visit. With either a video or telephone visit, we are not always able to ensure that we have a secure connection.  By engaging in this virtual visit, you consent to the provision of healthcare and authorize for your insurance to be billed (if applicable) for the services provided during this visit. Depending on your insurance coverage, you may receive a charge related to this service.  I need to obtain your verbal consent now. Are you willing to proceed with your visit today? Steven [name] has provided verbal consent on 12/19/2022 for a virtual visit (video or telephone). Steven Mayo, NP  Date: 12/19/2022 4:15 PM  Virtual Visit via Video Note   I, Steven [name], connected with  Steven [name]  ([id number], 04/05/1991) on 12/19/22 at  4:15 PM EST by a video-enabled telemedicine application and verified that I am speaking with the correct person using two identifiers.  Location: Patient: Virtual Visit Location Patient:  Home Provider: Virtual Visit Location Provider: Home Office   I discussed the limitations of evaluation and management by telemedicine and the availability of in person appointments. The patient expressed understanding and agreed to proceed.    History of Present Illness: Steven [name] is a 32 y.o. who identifies as a male who was assigned male at birth, and is being seen today for flu like illness. Onset of Thursday last week- his daughter was sick a few days prior- for a day or so- she took OTC and was better. Symptoms include- headache yesterday and today- took ibuprofen, it helped some. Feels weak and tired, some feelings of chills/cold, no fever to report, some body aches.Denies chest pain, shortness of breath, ear pain, sore throat, and congestion.    Problems:  Patient Active Problem List   Diagnosis Date Noted   Enlarged thyroid 10/05/2022   Elevated blood pressure reading 10/05/2022   Asthma 09/07/2022   Non compliance with medical treatment 12/12/2016   Sinus tachycardia 09/15/2012   Marijuana abuse    Hx of tobacco use, presenting hazards to health     Allergies: No Known Allergies Medications:  Current Outpatient Medications:    albuterol (PROVENTIL) (2.5 MG/3ML) 0.083% nebulizer solution, Take 3 mLs (2.5 mg total) by nebulization every 6 (six) hours as needed for wheezing or shortness of breath., Disp: 75 mL, Rfl: 12   albuterol (VENTOLIN HFA) 108 (90 Base) MCG/ACT inhaler, Inhale 2 puffs into the lungs every 6 (six) hours as needed for wheezing or shortness  of breath., Disp: 8 g, Rfl: 2   budesonide-formoterol (SYMBICORT) 160-4.5 MCG/ACT inhaler, Inhale 2 puffs into the lungs 2 (two) times daily., Disp: 1 each, Rfl: 12   promethazine-dextromethorphan (PROMETHAZINE-DM) 6.25-15 MG/5ML syrup, Take 5 mLs by mouth at bedtime as needed for cough. (Patient not taking: Reported on 11/24/2022), Disp: 118 mL, Rfl: 0   triamcinolone cream (KENALOG) 0.1 %, Apply 1 Application topically 2  (two) times daily. (Patient not taking: Reported on 11/24/2022), Disp: 30 g, Rfl: 1  Observations/Objective: Patient is well-developed, well-nourished in no acute distress.  Resting comfortably  at home.  Head is normocephalic, atraumatic.  No labored breathing.  Speech is clear and coherent with logical content.  Patient is alert and oriented at baseline.    Assessment and Plan:  1. Flu-like symptoms  Outside window for antiviral OTC reviewed and on AVS - Push fluids - Rest as needed   Patient acknowledged agreement and understanding of the plan.   Past Medical, Surgical, Social History, Allergies, and Medications have been Reviewed.    Follow Up Instructions: I discussed the assessment and treatment plan with the patient. The patient was provided an opportunity to ask questions and all were answered. The patient agreed with the plan and demonstrated an understanding of the instructions.  A copy of instructions were sent to the patient via MyChart unless otherwise noted below.     The patient was advised to call back or seek an in-person evaluation if the symptoms worsen or if the condition fails to improve as anticipated.  Time:  I spent 10 minutes with the patient via telehealth technology discussing the above problems/concerns.    Steven Mayo, NP

## 2022-12-19 NOTE — Patient Instructions (Addendum)
  Reginia Forts, thank you for joining Perlie Mayo, NP for today's virtual visit.  While this provider is not your primary care provider (PCP), if your PCP is located in our provider database this encounter information will be shared with them immediately following your visit.   [facility] account gives you access to today's visit and all your visits, tests, and labs performed at [facility] " click here if you don't have a [facility] account or go to [email or web address]  Consent: (Patient) Steven [name] provided verbal consent for this virtual visit at the beginning of the encounter.  Current Medications:  Current Outpatient Medications:    albuterol (PROVENTIL) (2.5 MG/3ML) 0.083% nebulizer solution, Take 3 mLs (2.5 mg total) by nebulization every 6 (six) hours as needed for wheezing or shortness of breath., Disp: 75 mL, Rfl: 12   albuterol (VENTOLIN HFA) 108 (90 Base) MCG/ACT inhaler, Inhale 2 puffs into the lungs every 6 (six) hours as needed for wheezing or shortness of breath., Disp: 8 g, Rfl: 2   budesonide-formoterol (SYMBICORT) 160-4.5 MCG/ACT inhaler, Inhale 2 puffs into the lungs 2 (two) times daily., Disp: 1 each, Rfl: 12   promethazine-dextromethorphan (PROMETHAZINE-DM) 6.25-15 MG/5ML syrup, Take 5 mLs by mouth at bedtime as needed for cough. (Patient not taking: Reported on 11/24/2022), Disp: 118 mL, Rfl: 0   triamcinolone cream (KENALOG) 0.1 %, Apply 1 Application topically 2 (two) times daily. (Patient not taking: Reported on 11/24/2022), Disp: 30 g, Rfl: 1   Medications ordered in this encounter:  No orders of the defined types were placed in this encounter.    *If you need refills on other medications prior to your next appointment, please contact your pharmacy*  Follow-Up: Call back or seek an in-person evaluation if the symptoms worsen or if the condition fails to improve as anticipated.  [facility] [phone]  Other  Instructions  -rest -hydrate -Tylenol and ibuprofen as needed for aches and pains -flonase daily  -follow up if not improving   If you have been instructed to have an in-person evaluation today at a local Urgent Care facility, please use the link below. It will take you to a list of all of our available [facility], including address, phone number and hours of operation. Please do not delay care.  [facility]  If you or a family member do not have a primary care provider, use the link below to schedule a visit and establish care. When you choose a [facility] primary care physician or advanced practice provider, you gain a long-term partner in health. Find a Primary Care Provider  Learn more about [facility]'s in-office and virtual care options: [facility]

## 2022-12-20 ENCOUNTER — Telehealth: Payer: BC Managed Care – PPO | Admitting: Physician Assistant

## 2022-12-20 ENCOUNTER — Encounter (HOSPITAL_COMMUNITY): Payer: Self-pay

## 2022-12-20 DIAGNOSIS — U071 COVID-19: Secondary | ICD-10-CM | POA: Diagnosis not present

## 2022-12-20 MED ORDER — NIRMATRELVIR/RITONAVIR (PAXLOVID)TABLET: 3.0000 | ORAL_TABLET | Freq: Two times a day (BID) | ORAL | 0 refills | Status: AC

## 2022-12-20 NOTE — Progress Notes (Signed)
Virtual Visit Consent   Steven [name], you are scheduled for a virtual visit with a [facility] provider today. Just as with appointments in the office, your consent must be obtained to participate. Your consent will be active for this visit and any virtual visit you may have with one of our providers in the next 365 days. If you have a MyChart account, a copy of this consent can be sent to you electronically.  As this is a virtual visit, video technology does not allow for your provider to perform a traditional examination. This may limit your provider's ability to fully assess your condition. If your provider identifies any concerns that need to be evaluated in person or the need to arrange testing (such as labs, EKG, etc.), we will make arrangements to do so. Although advances in technology are sophisticated, we cannot ensure that it will always work on either your end or our end. If the connection with a video visit is poor, the visit may have to be switched to a telephone visit. With either a video or telephone visit, we are not always able to ensure that we have a secure connection.  By engaging in this virtual visit, you consent to the provision of healthcare and authorize for your insurance to be billed (if applicable) for the services provided during this visit. Depending on your insurance coverage, you may receive a charge related to this service.  I need to obtain your verbal consent now. Are you willing to proceed with your visit today? Steven [name] has provided verbal consent on 12/20/2022 for a virtual visit (video or telephone). [name], [place]  Date: 12/20/2022 1:27 PM  Virtual Visit via Video Note   I, [name], connected with  Steven [name]  ([id number], 1990-12-04) on 12/20/22 at  1:15 PM EST by a video-enabled telemedicine application and verified that I am speaking with the correct person using two identifiers.  Location: Patient: Virtual Visit Location  Patient: Home Provider: Virtual Visit Location Provider: Home Office   I discussed the limitations of evaluation and management by telemedicine and the availability of in person appointments. The patient expressed understanding and agreed to proceed.    History of Present Illness: Steven [name] is a 32 y.o. who identifies as a male who was assigned male at birth, and is being seen today for COVID-19. Symptoms starting last Thursday night into Friday morning with some anorexia and mild fatigue. Over weekend with development of URI symptoms including head and chest congestion, cough. Yesterday into this morning with chills, aches. Denies chest pain or SOB. Tested for COVID at [facility] this morning and was positive. Marland Kitchen  HPI: HPI  Problems:  Patient Active Problem List   Diagnosis Date Noted   Enlarged thyroid 10/05/2022   Elevated blood pressure reading 10/05/2022   Asthma 09/07/2022   Non compliance with medical treatment 12/12/2016   Sinus tachycardia 09/15/2012   Marijuana abuse    Hx of tobacco use, presenting hazards to health     Allergies: No Known Allergies Medications:  Current Outpatient Medications:    nirmatrelvir/ritonavir (PAXLOVID) 20 x 150 MG & 10 x 100MG  TABS, Take 3 tablets by mouth 2 (two) times daily for 5 days. (Take nirmatrelvir 150 mg two tablets twice daily for 5 days and ritonavir 100 mg one tablet twice daily for 5 days) Patient GFR is > 60, Disp: 30 tablet, Rfl: 0   albuterol (PROVENTIL) (2.5 MG/3ML) 0.083% nebulizer solution, Take 3 mLs (2.5 mg total)  by nebulization every 6 (six) hours as needed for wheezing or shortness of breath., Disp: 75 mL, Rfl: 12   albuterol (VENTOLIN HFA) 108 (90 Base) MCG/ACT inhaler, Inhale 2 puffs into the lungs every 6 (six) hours as needed for wheezing or shortness of breath., Disp: 8 g, Rfl: 2   budesonide-formoterol (SYMBICORT) 160-4.5 MCG/ACT inhaler, Inhale 2 puffs into the lungs 2 (two) times daily., Disp: 1 each, Rfl:  12  Observations/Objective: Patient is well-developed, well-nourished in no acute distress.  Resting comfortably at home.  Head is normocephalic, atraumatic.  No labored breathing. Speech is clear and coherent with logical content.  Patient is alert and oriented at baseline.   Assessment and Plan: 1. COVID-19 - nirmatrelvir/ritonavir (PAXLOVID) 20 x 150 MG & 10 x 100MG  TABS; Take 3 tablets by mouth 2 (two) times daily for 5 days. (Take nirmatrelvir 150 mg two tablets twice daily for 5 days and ritonavir 100 mg one tablet twice daily for 5 days) Patient GFR is > 60  Dispense: 30 tablet; Refill: 0  Patient with multiple risk factors for complicated course of illness. Discussed risks/benefits of antiviral medications including most common potential ADRs. Patient voiced understanding and would like to proceed with antiviral medication. They are candidate for Paxlovid. Rx sent to pharmacy. Supportive measures, OTC medications and vitamin regimen reviewed. Quarantine reviewed in detail. Strict ER precautions discussed with patient.    Follow Up Instructions: I discussed the assessment and treatment plan with the patient. The patient was provided an opportunity to ask questions and all were answered. The patient agreed with the plan and demonstrated an understanding of the instructions.  A copy of instructions were sent to the patient via MyChart unless otherwise noted below.   The patient was advised to call back or seek an in-person evaluation if the symptoms worsen or if the condition fails to improve as anticipated.  Time:  I spent 10 minutes with the patient via telehealth technology discussing the above problems/concerns.    [name], PA-C

## 2022-12-20 NOTE — Patient Instructions (Addendum)
Steven [name], thank you for joining [name], PA-C for today's virtual visit.  While this provider is not your primary care provider (PCP), if your PCP is located in our provider database this encounter information will be shared with them immediately following your visit.   [name] account gives you access to today's visit and all your visits, tests, and labs performed at [facility] " click here if you don't have a [facility] account or go to [email or web address]  Consent: (Patient) Steven [name] provided verbal consent for this virtual visit at the beginning of the encounter.  Current Medications:  Current Outpatient Medications:    albuterol (PROVENTIL) (2.5 MG/3ML) 0.083% nebulizer solution, Take 3 mLs (2.5 mg total) by nebulization every 6 (six) hours as needed for wheezing or shortness of breath., Disp: 75 mL, Rfl: 12   albuterol (VENTOLIN HFA) 108 (90 Base) MCG/ACT inhaler, Inhale 2 puffs into the lungs every 6 (six) hours as needed for wheezing or shortness of breath., Disp: 8 g, Rfl: 2   budesonide-formoterol (SYMBICORT) 160-4.5 MCG/ACT inhaler, Inhale 2 puffs into the lungs 2 (two) times daily., Disp: 1 each, Rfl: 12   promethazine-dextromethorphan (PROMETHAZINE-DM) 6.25-15 MG/5ML syrup, Take 5 mLs by mouth at bedtime as needed for cough. (Patient not taking: Reported on 11/24/2022), Disp: 118 mL, Rfl: 0   triamcinolone cream (KENALOG) 0.1 %, Apply 1 Application topically 2 (two) times daily. (Patient not taking: Reported on 11/24/2022), Disp: 30 g, Rfl: 1   Medications ordered in this encounter:  No orders of the defined types were placed in this encounter.    *If you need refills on other medications prior to your next appointment, please contact your pharmacy*  Follow-Up: Call back or seek an in-person evaluation if the symptoms worsen or if the condition fails to improve as anticipated.  [place] [phone]  Other Instructions Please keep well-hydrated and get plenty of rest. Start a saline nasal rinse to flush out your nasal passages. You can use plain Mucinex to help thin congestion. If you have a humidifier, running in the bedroom at night. I want you to start OTC vitamin D3 1000 units daily, vitamin C 1000 mg daily, and a zinc supplement. Please take prescribed medications as directed.  You have been enrolled in a MyChart symptom monitoring program. Please answer these questions daily so we can keep track of how you are doing.  You were to quarantine for 5 days from onset of your symptoms.  After day 5, if you have had no fever and you are feeling better, you can end quarantine but need to mask for an additional 5 days. After day 5 if you have a fever or are having significant symptoms, please quarantine for full 10 days.  If you note any worsening of symptoms, any significant shortness of breath or any chest pain, please seek ER evaluation ASAP.  Please do not delay care!  COVID-19: What to Do if You Are Sick If you test positive and are an older adult or someone who is at high risk of getting very sick from COVID-19, treatment may be available. Contact a healthcare provider right away after a positive test to determine if you are eligible, even if your symptoms are mild right now. You can also visit a Test to Treat location and, if eligible, receive a prescription from a provider. Don't delay: Treatment must be started within the first few days to be effective. If you have  a fever, cough, or other symptoms, you might have COVID-19. Most people have mild illness and are able to recover at home. If you are sick: Keep track of your symptoms. If you have an emergency warning sign (including trouble breathing), call 911. Steps to help prevent the spread of COVID-19 if you are sick If you are sick with COVID-19 or think you might have COVID-19, follow the steps below to care for  yourself and to help protect other people in your home and community. Stay home except to get medical care Stay home. Most people with COVID-19 have mild illness and can recover at home without medical care. Do not leave your home, except to get medical care. Do not visit public areas and do not go to places where you are unable to wear a mask. Take care of yourself. Get rest and stay hydrated. Take over-the-counter medicines, such as acetaminophen, to help you feel better. Stay in touch with your doctor. Call before you get medical care. Be sure to get care if you have trouble breathing, or have any other emergency warning signs, or if you think it is an emergency. Avoid public transportation, ride-sharing, or taxis if possible. Get tested If you have symptoms of COVID-19, get tested. While waiting for test results, stay away from others, including staying apart from those living in your household. Get tested as soon as possible after your symptoms start. Treatments may be available for people with COVID-19 who are at risk for becoming very sick. Don't delay: Treatment must be started early to be effective--some treatments must begin within 5 days of your first symptoms. Contact your healthcare provider right away if your test result is positive to determine if you are eligible. Self-tests are one of several options for testing for the virus that causes COVID-19 and may be more convenient than laboratory-based tests and point-of-care tests. Ask your healthcare provider or your local health department if you need help interpreting your test results. You can visit your state, tribal, local, and territorial health department's website to look for the latest local information on testing sites. Separate yourself from other people As much as possible, stay in a specific room and away from other people and pets in your home. If possible, you should use a separate bathroom. If you need to be around other people  or animals in or outside of the home, wear a well-fitting mask. Tell your close contacts that they may have been exposed to COVID-19. An infected person can spread COVID-19 starting 48 hours (or 2 days) before the person has any symptoms or tests positive. By letting your close contacts know they may have been exposed to COVID-19, you are helping to protect everyone. See COVID-19 and Animals if you have questions about pets. If you are diagnosed with COVID-19, someone from the health department may call you. Answer the call to slow the spread. Monitor your symptoms Symptoms of COVID-19 include fever, cough, or other symptoms. Follow care instructions from your healthcare provider and local health department. Your local health authorities may give instructions on checking your symptoms and reporting information. When to seek emergency medical attention Look for emergency warning signs* for COVID-19. If someone is showing any of these signs, seek emergency medical care immediately: Trouble breathing Persistent pain or pressure in the chest New confusion Inability to wake or stay awake Pale, gray, or blue-colored skin, lips, or nail beds, depending on skin tone *This list is not all possible symptoms. Please call your  medical provider for any other symptoms that are severe or concerning to you. Call 911 or call ahead to your local emergency facility: Notify the operator that you are seeking care for someone who has or may have COVID-19. Call ahead before visiting your doctor Call ahead. Many medical visits for routine care are being postponed or done by phone or telemedicine. If you have a medical appointment that cannot be postponed, call your doctor's office, and tell them you have or may have COVID-19. This will help the office protect themselves and other patients. If you are sick, wear a well-fitting mask You should wear a mask if you must be around other people or animals, including pets (even  at home). Wear a mask with the best fit, protection, and comfort for you. You don't need to wear the mask if you are alone. If you can't put on a mask (because of trouble breathing, for example), cover your coughs and sneezes in some other way. Try to stay at least 6 feet away from other people. This will help protect the people around you. Masks should not be placed on young children under age 72 years, anyone who has trouble breathing, or anyone who is not able to remove the mask without help. Cover your coughs and sneezes Cover your mouth and nose with a tissue when you cough or sneeze. Throw away used tissues in a lined trash can. Immediately wash your hands with soap and water for at least 20 seconds. If soap and water are not available, clean your hands with an alcohol-based hand sanitizer that contains at least 60% alcohol. Clean your hands often Wash your hands often with soap and water for at least 20 seconds. This is especially important after blowing your nose, coughing, or sneezing; going to the bathroom; and before eating or preparing food. Use hand sanitizer if soap and water are not available. Use an alcohol-based hand sanitizer with at least 60% alcohol, covering all surfaces of your hands and rubbing them together until they feel dry. Soap and water are the best option, especially if hands are visibly dirty. Avoid touching your eyes, nose, and mouth with unwashed hands. Handwashing Tips Avoid sharing personal household items Do not share dishes, drinking glasses, cups, eating utensils, towels, or bedding with other people in your home. Wash these items thoroughly after using them with soap and water or put in the dishwasher. Clean surfaces in your home regularly Clean and disinfect high-touch surfaces (for example, doorknobs, tables, handles, light switches, and countertops) in your "sick room" and bathroom. In shared spaces, you should clean and disinfect surfaces and items after  each use by the person who is ill. If you are sick and cannot clean, a caregiver or other person should only clean and disinfect the area around you (such as your bedroom and bathroom) on an as needed basis. Your caregiver/other person should wait as long as possible (at least several hours) and wear a mask before entering, cleaning, and disinfecting shared spaces that you use. Clean and disinfect areas that may have blood, stool, or body fluids on them. Use household cleaners and disinfectants. Clean visible dirty surfaces with household cleaners containing soap or detergent. Then, use a household disinfectant. Use a product from [employer] List N: Disinfectants for Coronavirus (NWGNF-62). Be sure to follow the instructions on the label to ensure safe and effective use of the product. Many products recommend keeping the surface wet with a disinfectant for a certain period of time (look  at "contact time" on the product label). You may also need to wear personal protective equipment, such as gloves, depending on the directions on the product label. Immediately after disinfecting, wash your hands with soap and water for 20 seconds. For completed guidance on cleaning and disinfecting your home, visit Complete Disinfection Guidance. Take steps to improve ventilation at home Improve ventilation (air flow) at home to help prevent from spreading COVID-19 to other people in your household. Clear out COVID-19 virus particles in the air by opening windows, using air filters, and turning on fans in your home. Use this interactive tool to learn how to improve air flow in your home. When you can be around others after being sick with COVID-19 Deciding when you can be around others is different for different situations. Find out when you can safely end home isolation. For any additional questions about your care, contact your healthcare provider or state or local health department. 02/09/2021 Content source: [facility] for Immunization and Respiratory Diseases ([facility]), Division of Viral Diseases This information is not intended to replace advice given to you by your health care provider. Make sure you discuss any questions you have with your health care provider. Document Revised: 03/25/2021 Document Reviewed: 03/25/2021 Elsevier Patient Education  2022 [employer].    If you have been instructed to have an in-person evaluation today at a local Urgent Care facility, please use the link below. It will take you to a list of all of our available [facility], including address, phone number and hours of operation. Please do not delay care.  [facility]  If you or a family member do not have a primary care provider, use the link below to schedule a visit and establish care. When you choose a [facility] primary care physician or advanced practice provider, you gain a long-term partner in health. Find a Primary Care Provider  Learn more about [facility] in-office and virtual care options: [facility]

## 2022-12-22 ENCOUNTER — Telehealth: Payer: BC Managed Care – PPO | Admitting: Nurse Practitioner

## 2022-12-22 DIAGNOSIS — U071 COVID-19: Secondary | ICD-10-CM | POA: Diagnosis not present

## 2022-12-22 NOTE — Patient Instructions (Signed)
  Reginia Forts, thank you for joining Gildardo Pounds, NP for today's virtual visit.  While this provider is not your primary care provider (PCP), if your PCP is located in our provider database this encounter information will be shared with them immediately following your visit.   [facility] account gives you access to today's visit and all your visits, tests, and labs performed at [facility] " click here if you don't have a [facility] account or go to [email or web address]  Consent: (Patient) Steven [name] provided verbal consent for this virtual visit at the beginning of the encounter.  Current Medications:  Current Outpatient Medications:    albuterol (PROVENTIL) (2.5 MG/3ML) 0.083% nebulizer solution, Take 3 mLs (2.5 mg total) by nebulization every 6 (six) hours as needed for wheezing or shortness of breath., Disp: 75 mL, Rfl: 12   albuterol (VENTOLIN HFA) 108 (90 Base) MCG/ACT inhaler, Inhale 2 puffs into the lungs every 6 (six) hours as needed for wheezing or shortness of breath., Disp: 8 g, Rfl: 2   budesonide-formoterol (SYMBICORT) 160-4.5 MCG/ACT inhaler, Inhale 2 puffs into the lungs 2 (two) times daily., Disp: 1 each, Rfl: 12   nirmatrelvir/ritonavir (PAXLOVID) 20 x 150 MG & 10 x 100MG  TABS, Take 3 tablets by mouth 2 (two) times daily for 5 days. (Take nirmatrelvir 150 mg two tablets twice daily for 5 days and ritonavir 100 mg one tablet twice daily for 5 days) Patient GFR is > 60, Disp: 30 tablet, Rfl: 0   Medications ordered in this encounter:  No orders of the defined types were placed in this encounter.    *If you need refills on other medications prior to your next appointment, please contact your pharmacy*  Follow-Up: Call back or seek an in-person evaluation if the symptoms worsen or if the condition fails to improve as anticipated.  [place] [phone]  Other Instructions INSTRUCTIONS: use a humidifier for nasal  congestion Drink plenty of fluids, rest and wash hands frequently to avoid the spread of infection Alternate tylenol and Motrin for relief of fever and pain   If you have been instructed to have an in-person evaluation today at a local Urgent Care facility, please use the link below. It will take you to a list of all of our available [facility], including address, phone number and hours of operation. Please do not delay care.  [facility]  If you or a family member do not have a primary care provider, use the link below to schedule a visit and establish care. When you choose a [facility] primary care physician or advanced practice provider, you gain a long-term partner in health. Find a Primary Care Provider  Learn more about [facility] in-office and virtual care options: [facility]

## 2022-12-22 NOTE — Progress Notes (Signed)
Virtual Visit Consent   Reinaldo Helt, you are scheduled for a virtual visit with a [facility] provider today. Just as with appointments in the office, your consent must be obtained to participate. Your consent will be active for this visit and any virtual visit you may have with one of our providers in the next 365 days. If you have a MyChart account, a copy of this consent can be sent to you electronically.  As this is a virtual visit, video technology does not allow for your provider to perform a traditional examination. This may limit your provider's ability to fully assess your condition. If your provider identifies any concerns that need to be evaluated in person or the need to arrange testing (such as labs, EKG, etc.), we will make arrangements to do so. Although advances in technology are sophisticated, we cannot ensure that it will always work on either your end or our end. If the connection with a video visit is poor, the visit may have to be switched to a telephone visit. With either a video or telephone visit, we are not always able to ensure that we have a secure connection.  By engaging in this virtual visit, you consent to the provision of healthcare and authorize for your insurance to be billed (if applicable) for the services provided during this visit. Depending on your insurance coverage, you may receive a charge related to this service.  I need to obtain your verbal consent now. Are you willing to proceed with your visit today? Kaliel Bolds has provided verbal consent on 12/22/2022 for a virtual visit (video or telephone). Gildardo Pounds, NP  Date: 12/22/2022 5:49 PM  Virtual Visit via Video Note   I, Gildardo Pounds, connected with  Steven [name]  ([id number], [birth year]) on 12/22/22 at  5:30 PM EST by a video-enabled telemedicine application and verified that I am speaking with the correct person using two identifiers.  Location: Patient: Virtual Visit Location Patient:  Home Provider: Virtual Visit Location Provider: Home Office   I discussed the limitations of evaluation and management by telemedicine and the availability of in person appointments. The patient expressed understanding and agreed to proceed.    History of Present Illness: Steven [name] is a 32 y.o. who identifies as a male who was assigned male at birth, and is being seen today for COVID symptoms.  Mr. Bellew was diagnosed with COVID 3 days ago. He is currently  beint treated with Paxlovid but is requesting a work note due to persistent body aches mostly in his legs. Other symptoms include fatigue, chills and chest congestion. I have instructed him we will provide a note for his next 2 scheduled work days. Any additional days off would need to be approved by his PCP.     Problems:  Patient Active Problem List   Diagnosis Date Noted   Enlarged thyroid 10/05/2022   Elevated blood pressure reading 10/05/2022   Asthma 09/07/2022   Non compliance with medical treatment 12/12/2016   Sinus tachycardia 09/15/2012   Marijuana abuse    Hx of tobacco use, presenting hazards to health     Allergies: No Known Allergies Medications:  Current Outpatient Medications:    albuterol (PROVENTIL) (2.5 MG/3ML) 0.083% nebulizer solution, Take 3 mLs (2.5 mg total) by nebulization every 6 (six) hours as needed for wheezing or shortness of breath., Disp: 75 mL, Rfl: 12   albuterol (VENTOLIN HFA) 108 (90 Base) MCG/ACT inhaler, Inhale 2 puffs into the lungs every 6 (  six) hours as needed for wheezing or shortness of breath., Disp: 8 g, Rfl: 2   budesonide-formoterol (SYMBICORT) 160-4.5 MCG/ACT inhaler, Inhale 2 puffs into the lungs 2 (two) times daily., Disp: 1 each, Rfl: 12   nirmatrelvir/ritonavir (PAXLOVID) 20 x 150 MG & 10 x 100MG  TABS, Take 3 tablets by mouth 2 (two) times daily for 5 days. (Take nirmatrelvir 150 mg two tablets twice daily for 5 days and ritonavir 100 mg one tablet twice daily for 5 days) Patient  GFR is > 60, Disp: 30 tablet, Rfl: 0  Observations/Objective: Patient is well-developed, well-nourished in no acute distress.  Resting comfortably at home.  Head is normocephalic, atraumatic.  No labored breathing.  Speech is clear and coherent with logical content.  Patient is alert and oriented at baseline.    Assessment and Plan: 1. COVID-19 INSTRUCTIONS: use a humidifier for nasal congestion Drink plenty of fluids, rest and wash hands frequently to avoid the spread of infection Alternate tylenol and Motrin for relief of fever and pain  Follow Up Instructions: I discussed the assessment and treatment plan with the patient. The patient was provided an opportunity to ask questions and all were answered. The patient agreed with the plan and demonstrated an understanding of the instructions.  A copy of instructions were sent to the patient via MyChart unless otherwise noted below.    The patient was advised to call back or seek an in-person evaluation if the symptoms worsen or if the condition fails to improve as anticipated.  Time:  I spent 12 minutes with the patient via telehealth technology discussing the above problems/concerns.    Gildardo Pounds, NP

## 2023-01-17 ENCOUNTER — Emergency Department: Payer: PRIVATE HEALTH INSURANCE

## 2023-01-17 ENCOUNTER — Emergency Department
Admission: EM | Admit: 2023-01-17 | Discharge: 2023-01-17 | Disposition: A | Discharge status: Home / Self Care | Payer: PRIVATE HEALTH INSURANCE | Source: Ambulatory Visit | Attending: Emergency Medicine | Admitting: Emergency Medicine

## 2023-01-17 ENCOUNTER — Encounter: Payer: Self-pay | Admitting: Emergency Medicine

## 2023-01-17 DIAGNOSIS — R0789 Other chest pain: Secondary | ICD-10-CM | POA: Insufficient documentation

## 2023-01-17 DIAGNOSIS — R062 Wheezing: Secondary | ICD-10-CM | POA: Insufficient documentation

## 2023-01-17 DIAGNOSIS — R079 Chest pain, unspecified: Secondary | ICD-10-CM

## 2023-01-17 DIAGNOSIS — Z760 Encounter for issue of repeat prescription: Secondary | ICD-10-CM | POA: Insufficient documentation

## 2023-01-17 DIAGNOSIS — W1789XA Other fall from one level to another, initial encounter: Secondary | ICD-10-CM

## 2023-01-17 DIAGNOSIS — R0602 Shortness of breath: Secondary | ICD-10-CM

## 2023-01-17 MED ORDER — IPRATROPIUM-ALBUTEROL 0.5-2.5 MG/3ML IN SOLN *I*
3.0000 mL | Freq: Once | RESPIRATORY_TRACT | Status: AC
Start: 2023-01-17 — End: 2023-01-17
  Administered 2023-01-17: 3 mL via RESPIRATORY_TRACT
  Filled 2023-01-17: qty 3

## 2023-01-17 MED ORDER — PREDNISONE 20 MG PO TABS *I*
40.0000 mg | ORAL_TABLET | Freq: Every day | ORAL | 0 refills | Status: AC
Start: 2023-01-17 — End: 2023-01-22

## 2023-01-17 MED ORDER — ALBUTEROL SULFATE HFA 108 (90 BASE) MCG/ACT IN AERS *I*
1.0000 | INHALATION_SPRAY | Freq: Once | RESPIRATORY_TRACT | Status: AC
Start: 2023-01-17 — End: 2023-01-17
  Administered 2023-01-17: 1 via RESPIRATORY_TRACT
  Filled 2023-01-17: qty 6.7

## 2023-01-17 NOTE — ED Provider Notes (Signed)
History     Chief Complaint   Patient presents with    Chest Injury     32 year old male PMH of asthma presents to the ED for evaluation of dyspnea.      Patient reports that for the past few days his asthma is bothering him.  States he has been experiencing wheezing and shortness of breath.  He states that he is here visiting from [place] to do some work.  States that earlier today while helping to move a dresser he fell off a truck and landed on his back.  The dresser did not in fact fall on him.  He states that after the fall seems his breathing has been worse.  He is requesting refill for his inhaler.    Denies chest pain.      History provided by:  Patient  Language interpreter used: No          Medical/Surgical/Family History     History reviewed. No pertinent past medical history.     There is no problem list on file for this patient.           History reviewed. No pertinent surgical history.                  Review of Systems    Physical Exam     Triage Vitals  Triage Start: Start, (01/17/23 2057)  First Recorded BP: (!) 133/108, Resp: 18, Temp: 36 C (96.8 F), Temp src: Infrared Oxygen Therapy SpO2: 98 %, Oximetry Source: Lt Hand, O2 Device: None (Room air), Heart Rate: 81, (01/17/23 2058)  .  First Pain Reported  0-10 Scale: 10, Pain Location/Orientation: Chest, (01/17/23 2059)       Physical Exam  Vitals and nursing note reviewed.   Constitutional:       Appearance: He is well-developed.   HENT:      Head: Normocephalic and atraumatic.      Right Ear: External ear normal.      Left Ear: External ear normal.      Nose: Nose normal.   Eyes:      Conjunctiva/sclera: Conjunctivae normal.      Pupils: Pupils are equal, round, and reactive to light.   Cardiovascular:      Rate and Rhythm: Normal rate.   Pulmonary:      Effort: Pulmonary effort is normal.      Breath sounds: Wheezing present.      Comments: Scattered expiratory wheezes present in all lung fields..  Speaking complete sentences.  No  tachypnea.  No accessory muscle use.  Abdominal:      General: There is no distension.   Musculoskeletal:         General: No deformity. Normal range of motion.      Cervical back: Normal range of motion and neck supple.   Skin:     General: Skin is warm and dry.      Findings: No erythema or rash.   Neurological:      Mental Status: He is alert and oriented to person, place, and time.         Medical Decision Making   Patient seen by me on:  01/17/2023    Assessment:  32 year old male PMH of asthma ED with wheezing x 3 days, dyspnea.  [place] today and landed on his back now with worsened dyspnea.    Differential diagnosis:  PTX, PNA, asthma exacerbation, bronchitis    Plan:  Orders Placed This Encounter      *  Chest standard frontal and lateral views    Medications  ipratropium-albuterol (DUONEB) 0.5-2.'5mg'$  /15m nebulization solution 3 mL (3 mLs Nebulization Given 01/17/23 2212)  albuterol HFA (PROVENTIL, VENTOLIN, PROAIR HFA) inhaler 1 puff (1 puff Inhalation Given 01/17/23 2214)  ipratropium-albuterol (DUONEB) 0.5-2.'5mg'$  /386mnebulization solution 3 mL (3 mLs Nebulization Given 01/17/23 2233)      ED Course and Disposition:  CXR negative.  No PTX.  Patient feeling improved after receiving DuoNeb's.  Prescription for prednisone sent to pharmacy.  Patient discharged home with return precautions.  Patient agreeable with plan.        *Chest standard frontal and lateral views   Final Result        No acute disease in the chest.        END OF IMPRESSION                      [facility] submits this DICOM format image data and final report to the [facility] independent secure electronic health information exchange, on a reciprocally searchable basis (with patient authorization) for a minimum of 12 months after exam     date.                   BeDrucie IpPAGrovelandBeAccovillePAUtah03/04/24 [phone]

## 2023-01-17 NOTE — ED Triage Notes (Signed)
Pt to ED following a an accident where a dresser fell on him. Pt states dresser landed on his chest. Pt states "it hurts to breath". Pt seems to breathing comfortably at triage.     Prehospital medications given: No

## 2023-01-17 NOTE — Discharge Instructions (Signed)
Return to the ED if you develop persistent chest pain, shortness of breath

## 2023-01-22 ENCOUNTER — Other Ambulatory Visit: Payer: Self-pay

## 2023-01-22 ENCOUNTER — Emergency Department (HOSPITAL_BASED_OUTPATIENT_CLINIC_OR_DEPARTMENT_OTHER)
Admission: EM | Admit: 2023-01-22 | Discharge: 2023-01-22 | Disposition: A | Discharge status: Home / Self Care | Payer: 59 | Attending: Emergency Medicine | Admitting: Emergency Medicine

## 2023-01-22 ENCOUNTER — Emergency Department (HOSPITAL_BASED_OUTPATIENT_CLINIC_OR_DEPARTMENT_OTHER): Payer: 59

## 2023-01-22 ENCOUNTER — Encounter (HOSPITAL_BASED_OUTPATIENT_CLINIC_OR_DEPARTMENT_OTHER): Payer: Self-pay | Admitting: Emergency Medicine

## 2023-01-22 DIAGNOSIS — J45901 Unspecified asthma with (acute) exacerbation: Secondary | ICD-10-CM | POA: Insufficient documentation

## 2023-01-22 DIAGNOSIS — R0602 Shortness of breath: Secondary | ICD-10-CM | POA: Diagnosis present

## 2023-01-22 MED ORDER — MAGNESIUM SULFATE 2 GM/50ML IV SOLN
2.0000 g | Freq: Once | INTRAVENOUS | Status: AC
  Administered 2023-01-22: 2 g via INTRAVENOUS
  Filled 2023-01-22: qty 50

## 2023-01-22 MED ORDER — IPRATROPIUM-ALBUTEROL 0.5-2.5 (3) MG/3ML IN SOLN
3.0000 mL | Freq: Once | RESPIRATORY_TRACT | Status: AC
  Administered 2023-01-22: 3 mL via RESPIRATORY_TRACT

## 2023-01-22 MED ORDER — ALBUTEROL SULFATE (2.5 MG/3ML) 0.083% IN NEBU
2.5000 mg | INHALATION_SOLUTION | Freq: Once | RESPIRATORY_TRACT | Status: AC
  Administered 2023-01-22: 2.5 mg via RESPIRATORY_TRACT

## 2023-01-22 MED ORDER — IPRATROPIUM-ALBUTEROL 0.5-2.5 (3) MG/3ML IN SOLN
RESPIRATORY_TRACT | Status: AC
  Filled 2023-01-22: qty 3

## 2023-01-22 MED ORDER — ALBUTEROL SULFATE HFA 108 (90 BASE) MCG/ACT IN AERS
INHALATION_SPRAY | RESPIRATORY_TRACT | Status: AC
  Filled 2023-01-22: qty 6.7

## 2023-01-22 MED ORDER — DEXAMETHASONE SODIUM PHOSPHATE 10 MG/ML IJ SOLN
10.0000 mg | Freq: Once | INTRAMUSCULAR | Status: AC
  Administered 2023-01-22: 10 mg via INTRAVENOUS
  Filled 2023-01-22: qty 1

## 2023-01-22 MED ORDER — IPRATROPIUM-ALBUTEROL 0.5-2.5 (3) MG/3ML IN SOLN
RESPIRATORY_TRACT | Status: AC
  Administered 2023-01-22: 3 mL
  Filled 2023-01-22: qty 3

## 2023-01-22 MED ORDER — ALBUTEROL SULFATE HFA 108 (90 BASE) MCG/ACT IN AERS: 2.0000 | INHALATION_SPRAY | Freq: Once | RESPIRATORY_TRACT | Status: AC

## 2023-01-22 MED ORDER — ALBUTEROL SULFATE (2.5 MG/3ML) 0.083% IN NEBU
INHALATION_SOLUTION | RESPIRATORY_TRACT | Status: AC
  Filled 2023-01-22: qty 3

## 2023-01-22 MED ORDER — AEROCHAMBER PLUS FLO-VU LARGE MISC
1.0000 | Freq: Once | Status: AC
  Administered 2023-01-22: 1
  Filled 2023-01-22: qty 1

## 2023-01-22 MED ORDER — PREDNISONE 10 MG PO TABS: 40.0000 mg | ORAL_TABLET | Freq: Every day | ORAL | 0 refills | Status: AC

## 2023-01-22 MED ORDER — ALBUTEROL SULFATE (2.5 MG/3ML) 0.083% IN NEBU
INHALATION_SOLUTION | RESPIRATORY_TRACT | Status: AC
  Administered 2023-01-22: 2.5 mg via RESPIRATORY_TRACT
  Filled 2023-01-22: qty 3

## 2023-01-22 NOTE — Discharge Instructions (Signed)
You have been seen today for your complaint of shortness of breath. Your imaging was reassuring and showed no abnormalities. Your discharge medications include prednisone.  This is a steroid.  Take it as prescribed and for the entire duration of the prescription. Follow up with: A primary care provider of your choice.  You should follow-up within the next week Please seek immediate medical care if you develop any of the following symptoms: Your peak flow reading is less than 50% of your personal best. This is in the red zone, which means "danger." You develop chest pain or discomfort. Your medicines no longer seem to be helping. You are coughing up bloody mucus. You have a fever and your symptoms suddenly get worse. You have trouble swallowing. You feel very tired, and breathing becomes tiring. At this time there does not appear to be the presence of an emergent medical condition, however there is always the potential for conditions to change. Please read and follow the below instructions.  Do not take your medicine if  develop an itchy rash, swelling in your mouth or lips, or difficulty breathing; call 911 and seek immediate emergency medical attention if this occurs.  You may review your lab tests and imaging results in their entirety on your MyChart account.  Please discuss all results of fully with your primary care provider and other specialist at your follow-up visit.  Note: Portions of this text may have been transcribed using voice recognition software. Every effort was made to ensure accuracy; however, inadvertent computerized transcription errors may still be present.

## 2023-01-22 NOTE — ED Provider Notes (Signed)
[facility] Provider Note   CSN: [id number] Arrival date & time: 01/22/23  1223     History  No chief complaint on file.   Steven [name] is a 32 y.o. male.  With history of severe persistent asthma, bronchitis, marijuana use who presents to the ED for evaluation of shortness of breath.  He states he has had shortness of breath for the past 2 to 3 days.  He was in [place] last week for work and was seen in an emergency room there for similar symptoms.  He was discharged on steroids and an albuterol inhaler.  He states he lost his albuterol inhaler and finish his steroids.  His shortness of breath and returned when he returned to [place] 2 days ago.  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  Believes this is what has caused his acute exacerbation.  He denies unilateral leg swelling, hemoptysis, history of DVT or PE, recent surgery or cancer treatments, recent surgery, estrogen or progesterone use.  HPI     Home Medications Prior to Admission medications   Medication Sig Start Date End Date Taking? Authorizing Provider  predniSONE (DELTASONE) 10 MG tablet Take 4 tablets (40 mg total) by mouth daily with breakfast for 5 days. 01/22/23 01/27/23 Yes Johndaniel Catlin, Grafton Folk, PA-C  albuterol (PROVENTIL) (2.5 MG/3ML) 0.083% nebulizer solution Take 3 mLs (2.5 mg total) by nebulization every 6 (six) hours as needed for wheezing or shortness of breath. 09/07/22   McElwee, Lauren A, NP  albuterol (VENTOLIN HFA) 108 (90 Base) MCG/ACT inhaler Inhale 2 puffs into the lungs every 6 (six) hours as needed for wheezing or shortness of breath. 09/07/22   McElwee, Scheryl Darter, NP  budesonide-formoterol (SYMBICORT) 160-4.5 MCG/ACT inhaler Inhale 2 puffs into the lungs 2 (two) times daily. 10/20/22   Hunsucker, Bonna Gains, MD  promethazine (PHENERGAN) 25 MG tablet Take 1 tablet (25 mg total) by mouth every 6 (six) hours as needed for nausea or  vomiting. 09/23/17 12/25/19  Orpah Greek, MD      Allergies    Patient has no known allergies.    Review of Systems   Review of Systems  Respiratory:  Positive for shortness of breath.   All other systems reviewed and are negative.   Physical Exam Updated Vital Signs BP 137/88 (BP Location: Right Arm)   Pulse 93   Resp 20   SpO2 95%  Physical Exam Vitals and nursing note reviewed.  Constitutional:      General: He is not in acute distress.    Appearance: He is well-developed.     Comments: Resting comfortably in bed  HENT:     Head: Normocephalic and atraumatic.  Eyes:     Conjunctiva/sclera: Conjunctivae normal.  Cardiovascular:     Rate and Rhythm: Normal rate and regular rhythm.     Heart sounds: No murmur heard. Pulmonary:     Effort: Pulmonary effort is normal. No respiratory distress.     Breath sounds: Wheezing (Diffuse inspiratory and expiratory wheezing) present.     Comments: No respiratory distress Abdominal:     Palpations: Abdomen is soft.     Tenderness: There is no abdominal tenderness.  Musculoskeletal:        General: No swelling.     Cervical back: Neck supple.  Skin:    General: Skin is warm and dry.     Capillary Refill: Capillary refill takes less than 2 seconds.  Neurological:     Mental Status: He is alert.  Psychiatric:        Mood and Affect: Mood normal.     ED Results / Procedures / Treatments   Labs (all labs ordered are listed, but only abnormal results are displayed) Labs Reviewed - No data to display  EKG EKG Interpretation  Date/Time:  Sunday January 22 2023 15:55:51 EST Ventricular Rate:  88 PR Interval:  174 QRS Duration: 87 QT Interval:  344 QTC Calculation: 417 R Axis:   74 Text Interpretation: Sinus rhythm Anteroseptal infarct, old Nonspecific T abnormalities, inferior leads No acute changes No significant change since last tracing Confirmed by Varney Biles [id number]) on 01/22/2023 4:13:09 PM  Radiology DG  Chest Port 1 View  Result Date: 01/22/2023 CLINICAL DATA:  Shortness of breath EXAM: PORTABLE CHEST 1 VIEW COMPARISON:  August 27, 2022 FINDINGS: The heart size and mediastinal contours are within normal limits. Both lungs are clear. The visualized skeletal structures are unremarkable. IMPRESSION: No active disease. Electronically Signed   By: Dorise Bullion III M.D.   On: 01/22/2023 15:55    Procedures Procedures    Medications Ordered in ED Medications  albuterol (PROVENTIL) (2.5 MG/3ML) 0.083% nebulizer solution (  Not Given 01/22/23 1346)  albuterol (PROVENTIL) (2.5 MG/3ML) 0.083% nebulizer solution 2.5 mg (2.5 mg Nebulization Given 01/22/23 1345)  ipratropium-albuterol (DUONEB) 0.5-2.5 (3) MG/3ML nebulizer solution 3 mL (3 mLs Nebulization Not Given 01/22/23 1343)  ipratropium-albuterol (DUONEB) 0.5-2.5 (3) MG/3ML nebulizer solution (3 mLs  Given 01/22/23 1344)  magnesium sulfate IVPB 2 g 50 mL (0 g Intravenous Stopped 01/22/23 1620)  dexamethasone (DECADRON) injection 10 mg (10 mg Intravenous Given 01/22/23 1406)  albuterol (VENTOLIN HFA) 108 (90 Base) MCG/ACT inhaler 2 puff ( Inhalation Given 01/22/23 1519)  AeroChamber Plus Flo-Vu Large MISC 1 each (1 each Other Given 01/22/23 1520)    ED Course/ Medical Decision Making/ A&P Clinical Course as of 01/22/23 1629  Sun Jan 22, 2023  1514 On reevaluation patient feels much better.  Has some wheezing in the right upper lobe but is overall significantly improved [AS]    Clinical Course User Index [AS] Claudie Fisherman Grafton Folk, PA-C                             Medical Decision Making Risk Prescription drug management.  This patient presents to the ED for concern of shortness of breath, this involves an extensive number of treatment options, and is a complaint that carries with it a high risk of complications and morbidity.  The emergent differential diagnosis for shortness of breath includes, but is not limited to, Pulmonary edema, bronchoconstriction,  Pneumonia, Pulmonary embolism, Pneumotherax/ Hemothorax, Dysrythmia, ACS.    Co morbidities that complicate the patient evaluation  severe persistent asthma, bronchitis, marijuana use  My initial workup includes DuoNeb, IV magnesium, Decadron  Additional history obtained from: Nursing notes from this visit. Previous records within EMR system ED visit for similar on 08/27/2022 and 10/27/2022  Cardiac Monitoring:  The patient was maintained on a cardiac monitor.  I personally viewed and interpreted the cardiac monitored which showed an underlying rhythm of: NSR  I ordered chest x-ray.  Personally reviewed and interpreted the image as normal.  Agree with radiologist interpretation  Afebrile, hemodynamically stable.  He maintained a pulse ox of 96+ percent on room air.  32 year old male presenting to the ED for evaluation of shortness of breath.  Has an extensive  history of severe persistent asthma.  States feels like an asthma exacerbation.  Symptoms began 2 to 3 days ago after being exposed to secondhand smoke.  States he ran lost his albuterol inhaler.  He appears overall very well on initial exam, however had significant inspiratory and expiratory wheezing in all lung fields.  Was given multiple DuoNeb treatments, magnesium and steroids with significant improvement in his symptoms.  He reported feeling much better after the symptoms.  He is PERC negative and I have low suspicion for PE as a cause of his symptoms. EKG shows t wave inversions in lead III. This is unchanged from 2021. No acute ischemic changes. No chest pain.  He does not follow with primary care on a regular basis.  He was encouraged to regularly follow with a primary care provider and would likely benefit from a daily inhaled corticosteroid.  He was given an albuterol inhaler and an AeroChamber in the ED and educated on appropriate use.  He was also sent prescription for steroid burst.  He was given return precautions.  Stable at  discharge.  At this time there does not appear to be any evidence of an acute emergency medical condition and the patient appears stable for discharge with appropriate outpatient follow up. Diagnosis was discussed with patient who verbalizes understanding of care plan and is agreeable to discharge. I have discussed return precautions with patient who verbalizes understanding. Patient encouraged to follow-up with their PCP within 1 week. All questions answered.  Note: Portions of this report may have been transcribed using voice recognition software. Every effort was made to ensure accuracy; however, inadvertent computerized transcription errors may still be present.        Final Clinical Impression(s) / ED Diagnoses Final diagnoses:  Exacerbation of asthma, unspecified asthma severity, unspecified whether persistent    Rx / DC Orders ED Discharge Orders          Ordered    predniSONE (DELTASONE) 10 MG tablet  Daily with breakfast        01/22/23 1609              Roylene Reason, PA-C 01/22/23 1629    Regan Lemming, MD 01/23/23 [phone]

## 2023-01-22 NOTE — ED Triage Notes (Signed)
Hx asthma, needs inhaler, has lost it. Hard time breathing for about a week, just got back from [place].

## 2023-01-22 NOTE — ED Notes (Signed)
As I write this, pt. Is receiving a h.h.n. being administered by our R.T., Robin.

## 2023-01-22 NOTE — Progress Notes (Signed)
Pt was diminished and stated he wasn't able to catch his breath. RT gave the Pt duoneb and albuterol and the Pt was still diminished. He received another nebulizer and I heard some wheezes but was still diminished. Doctor ordered magnesium and steroids IV. RT will continue to monitor

## 2023-01-22 NOTE — ED Notes (Signed)
He tells me he feels "a lot better".

## 2023-01-27 ENCOUNTER — Emergency Department (HOSPITAL_BASED_OUTPATIENT_CLINIC_OR_DEPARTMENT_OTHER): Payer: 59

## 2023-01-27 ENCOUNTER — Other Ambulatory Visit: Payer: Self-pay

## 2023-01-27 ENCOUNTER — Emergency Department (HOSPITAL_BASED_OUTPATIENT_CLINIC_OR_DEPARTMENT_OTHER)
Admission: EM | Admit: 2023-01-27 | Discharge: 2023-01-27 | Disposition: A | Discharge status: Home / Self Care | Payer: 59 | Attending: Emergency Medicine | Admitting: Emergency Medicine

## 2023-01-27 ENCOUNTER — Encounter (HOSPITAL_BASED_OUTPATIENT_CLINIC_OR_DEPARTMENT_OTHER): Payer: Self-pay

## 2023-01-27 DIAGNOSIS — B338 Other specified viral diseases: Secondary | ICD-10-CM

## 2023-01-27 DIAGNOSIS — B974 Respiratory syncytial virus as the cause of diseases classified elsewhere: Secondary | ICD-10-CM | POA: Diagnosis not present

## 2023-01-27 DIAGNOSIS — J101 Influenza due to other identified influenza virus with other respiratory manifestations: Secondary | ICD-10-CM | POA: Diagnosis not present

## 2023-01-27 DIAGNOSIS — Z20822 Contact with and (suspected) exposure to covid-19: Secondary | ICD-10-CM | POA: Diagnosis not present

## 2023-01-27 DIAGNOSIS — J4541 Moderate persistent asthma with (acute) exacerbation: Secondary | ICD-10-CM | POA: Diagnosis not present

## 2023-01-27 DIAGNOSIS — R0602 Shortness of breath: Secondary | ICD-10-CM | POA: Diagnosis present

## 2023-01-27 LAB — BASIC METABOLIC PANEL
Anion gap: 8 (ref 5–15)
BUN: 23 mg/dL — ABNORMAL HIGH (ref 6–20)
CO2: 26 mmol/L (ref 22–32)
Calcium: 9.1 mg/dL (ref 8.9–10.3)
Chloride: 103 mmol/L (ref 98–111)
Creatinine, Ser: 0.76 mg/dL (ref 0.61–1.24)
GFR, Estimated: >60 mL/min (ref >60)
Glucose, Bld: 95 mg/dL (ref 70–99)
Potassium: 3.9 mmol/L (ref 3.5–5.1)
Sodium: 137 mmol/L (ref 135–145)

## 2023-01-27 LAB — CBC WITH DIFFERENTIAL/PLATELET
Abs Immature Granulocytes: 0.01 10*3/uL (ref 0.00–0.07)
Basophils Absolute: 0 10*3/uL (ref 0.0–0.1)
Basophils Relative: 0 %
Eosinophils Absolute: 0.7 10*3/uL — ABNORMAL HIGH (ref 0.0–0.5)
Eosinophils Relative: 16 %
HCT: 47.4 % (ref 39.0–52.0)
Hemoglobin: 15.9 g/dL (ref 13.0–17.0)
Immature Granulocytes: 0 %
Lymphocytes Relative: 11 %
Lymphs Abs: 0.5 10*3/uL — ABNORMAL LOW (ref 0.7–4.0)
MCH: 28.5 pg (ref 26.0–34.0)
MCHC: 33.5 g/dL (ref 30.0–36.0)
MCV: 84.9 fL (ref 80.0–100.0)
Monocytes Absolute: 0.8 10*3/uL (ref 0.1–1.0)
Monocytes Relative: 19 %
Neutro Abs: 2.4 10*3/uL (ref 1.7–7.7)
Neutrophils Relative %: 54 %
Platelets: 237 10*3/uL (ref 150–400)
RBC: 5.58 MIL/uL (ref 4.22–5.81)
RDW: 13.2 % (ref 11.5–15.5)
WBC: 4.5 10*3/uL (ref 4.0–10.5)
nRBC: 0 % (ref 0.0–0.2)

## 2023-01-27 LAB — RESP PANEL BY RT-PCR (RSV, FLU A&B, COVID)  RVPGX2
Influenza A by PCR: NEGATIVE
Influenza B by PCR: POSITIVE — AB
Resp Syncytial Virus by PCR: POSITIVE — AB
SARS Coronavirus 2 by RT PCR: NEGATIVE

## 2023-01-27 LAB — D-DIMER, QUANTITATIVE: D-Dimer, Quant: <0.27 ug/mL-FEU (ref 0.00–0.50)

## 2023-01-27 MED ORDER — ALBUTEROL (5 MG/ML) CONTINUOUS INHALATION SOLN
10.0000 mg/h | INHALATION_SOLUTION | Freq: Once | RESPIRATORY_TRACT | Status: AC
  Administered 2023-01-27: 10 mg/h via RESPIRATORY_TRACT
  Filled 2023-01-27: qty 20

## 2023-01-27 MED ORDER — IPRATROPIUM BROMIDE 0.02 % IN SOLN
0.5000 mg | Freq: Once | RESPIRATORY_TRACT | Status: AC
  Administered 2023-01-27: 0.5 mg via RESPIRATORY_TRACT
  Filled 2023-01-27: qty 2.5

## 2023-01-27 MED ORDER — ACETAMINOPHEN 500 MG PO TABS: 500.0000 mg | ORAL_TABLET | Freq: Four times a day (QID) | ORAL | 0 refills | Status: AC | PRN

## 2023-01-27 MED ORDER — PREDNISONE 50 MG PO TABS
60.0000 mg | ORAL_TABLET | Freq: Once | ORAL | Status: AC
  Administered 2023-01-27: 60 mg via ORAL
  Filled 2023-01-27: qty 1

## 2023-01-27 MED ORDER — GUAIFENESIN 200 MG PO TABS: 200.0000 mg | ORAL_TABLET | ORAL | 0 refills | Status: AC | PRN

## 2023-01-27 MED ORDER — MAGNESIUM SULFATE 50 % IJ SOLN
1.0000 g | Freq: Once | INTRAMUSCULAR | Status: AC
  Administered 2023-01-27: 1 g via INTRAVENOUS
  Filled 2023-01-27: qty 2

## 2023-01-27 MED ORDER — BENZONATATE 100 MG PO CAPS: 100.0000 mg | ORAL_CAPSULE | Freq: Three times a day (TID) | ORAL | 0 refills | Status: AC

## 2023-01-27 NOTE — ED Notes (Signed)
Pt ambulated well with a steady gait. His oxygen level stayed between 94-95 and HR 102-106

## 2023-01-27 NOTE — Discharge Instructions (Addendum)
You have been diagnosed with Influenza and RVS virus infection affecting your upper respiratory tract and affecting your asthma.  Take medications prescribed and get plenty of rest. Return if you have other concerns.

## 2023-01-27 NOTE — ED Provider Notes (Signed)
[facility] Provider Note   CSN: [id number] Arrival date & time: 01/27/23  1240     History  Chief Complaint  Patient presents with   Shortness of Breath    Steven [name] is a 32 y.o. male.  The history is provided by the patient and medical records. No language interpreter was used.  Shortness of Breath    32 year old male with significant history of asthma, tobacco use, marijuana use, who presenting with shortness of breath.  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  For the past week he has had increased shortness of breath, wheezing, cough productive with yellow sputum and overall not feeling well.  He also reported having bouts of night sweats.  He mention he has been taking medication previously prescribed when he was seen for his complaint and noticed no significant improvement.  He felt his symptoms is likely due to his coworker who smokes tobacco which irritate his lung.  He admits to history of asthma for which he uses inhalers on a regular basis.  He is increasing his inhaler use without adequate relief.  He denies any prior history of PE or DVT denies any leg swelling or calf pain.  No runny nose sneezing or sore throat.  Home Medications Prior to Admission medications   Medication Sig Start Date End Date Taking? Authorizing Provider  albuterol (PROVENTIL) (2.5 MG/3ML) 0.083% nebulizer solution Take 3 mLs (2.5 mg total) by nebulization every 6 (six) hours as needed for wheezing or shortness of breath. 09/07/22   McElwee, Lauren A, NP  albuterol (VENTOLIN HFA) 108 (90 Base) MCG/ACT inhaler Inhale 2 puffs into the lungs every 6 (six) hours as needed for wheezing or shortness of breath. 09/07/22   McElwee, Scheryl Darter, NP  budesonide-formoterol (SYMBICORT) 160-4.5 MCG/ACT inhaler Inhale 2 puffs into the lungs 2 (two) times daily. 10/20/22   Hunsucker, Bonna Gains, MD  predniSONE (DELTASONE) 10 MG tablet Take 4 tablets (40 mg total)  by mouth daily with breakfast for 5 days. 01/22/23 01/27/23  Schutt, Grafton Folk, PA-C  promethazine (PHENERGAN) 25 MG tablet Take 1 tablet (25 mg total) by mouth every 6 (six) hours as needed for nausea or vomiting. 09/23/17 12/25/19  Orpah Greek, MD      Allergies    Patient has no known allergies.    Review of Systems   Review of Systems  Respiratory:  Positive for shortness of breath.   All other systems reviewed and are negative.   Physical Exam Updated Vital Signs BP 133/81 (BP Location: Right Arm)   Pulse 93   Temp 98 F (36.7 C)   Resp 15   Ht '5\' 5"'$  (1.651 m)   Wt 81.6 kg   SpO2 97%   BMI 29.95 kg/m  Physical Exam Vitals and nursing note reviewed.  Constitutional:      General: He is not in acute distress.    Appearance: He is well-developed.  HENT:     Head: Atraumatic.  Eyes:     Conjunctiva/sclera: Conjunctivae normal.  Cardiovascular:     Rate and Rhythm: Tachycardia present.     Heart sounds: No murmur heard.    No friction rub. No gallop.  Pulmonary:     Breath sounds: Wheezing present. No rhonchi or rales.  Abdominal:     Palpations: Abdomen is soft.  Musculoskeletal:     Cervical back: Normal range of motion and neck supple.     Right lower leg:  No edema.     Left lower leg: No edema.  Skin:    Findings: No rash.  Neurological:     Mental Status: He is alert.     ED Results / Procedures / Treatments   Labs (all labs ordered are listed, but only abnormal results are displayed) Labs Reviewed  RESP PANEL BY RT-PCR (RSV, FLU A&B, COVID)  RVPGX2 - Abnormal; Notable for the following components:      Result Value   Influenza B by PCR POSITIVE (*)    Resp Syncytial Virus by PCR POSITIVE (*)    All other components within normal limits  CBC WITH DIFFERENTIAL/PLATELET - Abnormal; Notable for the following components:   Lymphs Abs 0.5 (*)    Eosinophils Absolute 0.7 (*)    All other components within normal limits  BASIC METABOLIC PANEL -  Abnormal; Notable for the following components:   BUN 23 (*)    All other components within normal limits  D-DIMER, QUANTITATIVE    EKG None  Radiology DG Chest Portable 1 View  Result Date: 01/27/2023 CLINICAL DATA:  Shortness of breath for four days. EXAM: PORTABLE CHEST 1 VIEW COMPARISON:  01/22/2023 and prior radiographs FINDINGS: Telemetry leads overlie the chest. The cardiomediastinal silhouette is unremarkable. There is no evidence of focal airspace disease, pulmonary edema, suspicious pulmonary nodule/mass, pleural effusion, or pneumothorax. No acute bony abnormalities are identified. IMPRESSION: No active disease. Electronically Signed   By: Margarette Canada M.D.   On: 01/27/2023 13:23    Procedures Procedures    Medications Ordered in ED Medications  predniSONE (DELTASONE) tablet 60 mg (60 mg Oral Given 01/27/23 1331)  magnesium sulfate (IV Push/IM) injection 1 g (1 g Intravenous Given 01/27/23 1331)  albuterol (PROVENTIL,VENTOLIN) solution continuous neb (10 mg/hr Nebulization Given 01/27/23 1342)  ipratropium (ATROVENT) nebulizer solution 0.5 mg (0.5 mg Nebulization Given 01/27/23 1342)    ED Course/ Medical Decision Making/ A&P                             Medical Decision Making Amount and/or Complexity of Data Reviewed Labs: ordered. Radiology: ordered.  Risk Prescription drug management.   BP 133/81 (BP Location: Right Arm)   Pulse 93   Temp 98 F (36.7 C)   Resp 15   Ht '5\' 5"'$  (1.651 m)   Wt 81.6 kg   SpO2 97%   BMI 29.95 kg/m   36:66 PM 32 year old male with significant history of asthma, tobacco use, marijuana use, who presenting with shortness of breath.  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  For the past week he has had increased shortness of breath, wheezing, cough productive with yellow sputum and overall not feeling well.  He also reported having bouts of night sweats.  He mention he has been taking medication previously prescribed when he was  seen for his complaint and noticed no significant improvement.  He felt his symptoms is likely due to his coworker who smokes tobacco which irritate his lung.  He admits to history of asthma for which he uses inhalers on a regular basis.  He is increasing his inhaler use without adequate relief.  He denies any prior history of PE or DVT denies any leg swelling or calf pain.  No runny nose sneezing or sore throat.  On exam, this is a well-appearing male appears to be in no acute discomfort.  He coughs on occasion.  Heart exam with  normal rate and rhythm, lungs with both inspiratory and expiratory wheezes and decreased breath sounds.  Abdomen soft nontender, no peripheral edema noted.  No calf tenderness.  -Labs ordered, independently viewed and interpreted by me.  Labs remarkable for Influenza B and RSV positive. Normal d-dimer, doubt PE. -The patient was maintained on a cardiac monitor.  I personally viewed and interpreted the cardiac monitored which showed an underlying rhythm of: NSR -Imaging independently viewed and interpreted by me and I agree with radiologist's interpretation.  Result remarkable for CXR without focal infiltrates or acute changes -This patient presents to the ED for concern of SOB, this involves an extensive number of treatment options, and is a complaint that carries with it a high risk of complications and morbidity.  The differential diagnosis includes covid, flu, rsv, pna, PE, pleurisy, CHF, reactive airway disease, asthma -Co morbidities that complicate the patient evaluation includes asthma -Treatment includes albuterol, atrovent, mag, prednisone -Reevaluation of the patient after these medicines showed that the patient improved -PCP office notes or outside notes reviewed -Escalation to admission/observation considered: patients feels much better, is comfortable with discharge, and will follow up with PCP -Prescription medication considered, patient comfortable with tessalon,  tylenol and continue with rescue inhaler -Social Determinant of Health considered          Final Clinical Impression(s) / ED Diagnoses Final diagnoses:  Influenza B  RSV (respiratory syncytial virus infection)  Moderate persistent asthma with exacerbation    Rx / DC Orders ED Discharge Orders          Ordered    benzonatate (TESSALON) 100 MG capsule  Every 8 hours        01/27/23 1543    acetaminophen (TYLENOL) 500 MG tablet  Every 6 hours PRN        01/27/23 1543    guaiFENesin 200 MG tablet  Every 4 hours PRN        01/27/23 1543              Domenic Moras, PA-C 01/27/23 1547    Wyvonnia Dusky, MD 01/27/23 [phone]

## 2023-01-27 NOTE — ED Triage Notes (Signed)
Patient here POV from Home.  Endorses being seen approximately 4 Days ago for SOB related to Asthma. Has been feeling ill recently with a Cough and Chills. Also notes exposure to Cigarette Smoke which has worsened breathing. Inhaler Use with Some relief.   NAD Noted during Triage. A&Ox4. GCS 15. Ambulatory.

## 2023-01-28 ENCOUNTER — Emergency Department (HOSPITAL_BASED_OUTPATIENT_CLINIC_OR_DEPARTMENT_OTHER): Payer: Self-pay

## 2023-01-28 ENCOUNTER — Emergency Department (HOSPITAL_BASED_OUTPATIENT_CLINIC_OR_DEPARTMENT_OTHER)
Admission: EM | Admit: 2023-01-28 | Discharge: 2023-01-28 | Disposition: A | Discharge status: Home / Self Care | Payer: Self-pay | Attending: Emergency Medicine | Admitting: Emergency Medicine

## 2023-01-28 ENCOUNTER — Encounter (HOSPITAL_BASED_OUTPATIENT_CLINIC_OR_DEPARTMENT_OTHER): Payer: Self-pay

## 2023-01-28 ENCOUNTER — Other Ambulatory Visit: Payer: Self-pay

## 2023-01-28 DIAGNOSIS — R0602 Shortness of breath: Secondary | ICD-10-CM | POA: Insufficient documentation

## 2023-01-28 DIAGNOSIS — Z7951 Long term (current) use of inhaled steroids: Secondary | ICD-10-CM | POA: Insufficient documentation

## 2023-01-28 DIAGNOSIS — Z87891 Personal history of nicotine dependence: Secondary | ICD-10-CM | POA: Insufficient documentation

## 2023-01-28 DIAGNOSIS — J4550 Severe persistent asthma, uncomplicated: Secondary | ICD-10-CM | POA: Insufficient documentation

## 2023-01-28 LAB — CBC WITH DIFFERENTIAL/PLATELET
Abs Immature Granulocytes: 0.01 10*3/uL (ref 0.00–0.07)
Basophils Absolute: 0 10*3/uL (ref 0.0–0.1)
Basophils Relative: 1 %
Eosinophils Absolute: 0.2 10*3/uL (ref 0.0–0.5)
Eosinophils Relative: 4 %
HCT: 46.9 % (ref 39.0–52.0)
Hemoglobin: 15.9 g/dL (ref 13.0–17.0)
Immature Granulocytes: 0 %
Lymphocytes Relative: 14 %
Lymphs Abs: 0.5 10*3/uL — ABNORMAL LOW (ref 0.7–4.0)
MCH: 28.4 pg (ref 26.0–34.0)
MCHC: 33.9 g/dL (ref 30.0–36.0)
MCV: 83.9 fL (ref 80.0–100.0)
Monocytes Absolute: 0.9 10*3/uL (ref 0.1–1.0)
Monocytes Relative: 25 %
Neutro Abs: 2.1 10*3/uL (ref 1.7–7.7)
Neutrophils Relative %: 56 %
Platelets: 217 10*3/uL (ref 150–400)
RBC: 5.59 MIL/uL (ref 4.22–5.81)
RDW: 13.2 % (ref 11.5–15.5)
WBC: 3.7 10*3/uL — ABNORMAL LOW (ref 4.0–10.5)
nRBC: 0 % (ref 0.0–0.2)

## 2023-01-28 LAB — BASIC METABOLIC PANEL
Anion gap: 10 (ref 5–15)
BUN: 16 mg/dL (ref 6–20)
CO2: 24 mmol/L (ref 22–32)
Calcium: 9.4 mg/dL (ref 8.9–10.3)
Chloride: 103 mmol/L (ref 98–111)
Creatinine, Ser: 0.88 mg/dL (ref 0.61–1.24)
GFR, Estimated: >60 mL/min (ref >60)
Glucose, Bld: 137 mg/dL — ABNORMAL HIGH (ref 70–99)
Potassium: 3.3 mmol/L — ABNORMAL LOW (ref 3.5–5.1)
Sodium: 137 mmol/L (ref 135–145)

## 2023-01-28 MED ORDER — IPRATROPIUM-ALBUTEROL 0.5-2.5 (3) MG/3ML IN SOLN
3.0000 mL | RESPIRATORY_TRACT | Status: AC
  Administered 2023-01-28 (×2): 3 mL via RESPIRATORY_TRACT
  Filled 2023-01-28: qty 6

## 2023-01-28 MED ORDER — PREDNISONE 10 MG (21) PO TBPK: ORAL_TABLET | Freq: Every day | ORAL | 0 refills | Status: DC

## 2023-01-28 MED ORDER — ALBUTEROL SULFATE (2.5 MG/3ML) 0.083% IN NEBU
5.0000 mg | INHALATION_SOLUTION | Freq: Once | RESPIRATORY_TRACT | Status: AC
  Filled 2023-01-28: qty 6

## 2023-01-28 MED ORDER — METHYLPREDNISOLONE SODIUM SUCC 125 MG IJ SOLR
125.0000 mg | Freq: Once | INTRAMUSCULAR | Status: AC
  Administered 2023-01-28: 125 mg via INTRAVENOUS
  Filled 2023-01-28: qty 2

## 2023-01-28 MED ORDER — POTASSIUM CHLORIDE CRYS ER 20 MEQ PO TBCR
20.0000 meq | EXTENDED_RELEASE_TABLET | Freq: Once | ORAL | Status: AC
  Administered 2023-01-28: 20 meq via ORAL
  Filled 2023-01-28: qty 1

## 2023-01-28 MED ORDER — IPRATROPIUM-ALBUTEROL 0.5-2.5 (3) MG/3ML IN SOLN: 3.0000 mL | Freq: Four times a day (QID) | RESPIRATORY_TRACT | 0 refills | Status: DC | PRN

## 2023-01-28 MED ORDER — ALBUTEROL SULFATE (2.5 MG/3ML) 0.083% IN NEBU
INHALATION_SOLUTION | RESPIRATORY_TRACT | Status: AC
  Administered 2023-01-28: 5 mg via RESPIRATORY_TRACT
  Filled 2023-01-28: qty 6

## 2023-01-28 MED ORDER — ALBUTEROL SULFATE (2.5 MG/3ML) 0.083% IN NEBU
5.0000 mg | INHALATION_SOLUTION | Freq: Once | RESPIRATORY_TRACT | Status: AC
  Administered 2023-01-28: 5 mg via RESPIRATORY_TRACT
  Filled 2023-01-28: qty 6

## 2023-01-28 NOTE — ED Notes (Signed)
Discharge instructions discussed with pt. Pt verbalized understanding. Pt stable and ambulatory.  °

## 2023-01-28 NOTE — ED Provider Notes (Signed)
Care of patient received from prior provider at 3:11 PM, please see their note for complete H/P and care plan.  Received handoff per ED course.  Clinical Course as of 01/28/23 1635  Sat Jan 28, 2023  1414 DG Chest Portable 1 View IMPRESSION: 1. No acute cardiopulmonary process. 2. Hyperinflated lungs, which can be seen in the setting of asthma.   [HN]  1414 CBC with Differential(!) No significant findings [HN]  99991111 Basic metabolic panel(!) Very mild hypokalemia, which will replete [HN]  1509 Stable asthma exacerbation Flu/RSV+. 2nd visit for asthma [CC]  1523 Reassessed at bedside.  Comfortably breathing no acute distress. States he has a nebulizer at home and would prefer outpatient care and management.  De-escalated back to room air satting 93%. RT called back to room to complete another treatment.  Will observe for another 90 minutes hopefully patient will continue to improve will be stable for outpatient care management with p.o. steroids, nebulizer therapy as needed. [CC]    Clinical Course User Index [CC] Tretha Sciara, MD [HN] Audley Hose, MD    Reassessment: On reassessment, symptoms resolved.  Patient feels comfortable outpatient care management.  Disposition:  I have considered need for hospitalization, however, considering all of the above, I believe this patient is stable for discharge at this time.  Patient/family educated about specific return precautions for given chief complaint and symptoms.  Patient/family educated about follow-up with PCP.     Patient/family expressed understanding of return precautions and need for follow-up. Patient spoken to regarding all imaging and laboratory results and appropriate follow up for these results. All education provided in verbal form with additional information in written form. Time was allowed for answering of patient questions. Patient discharged.    Emergency Department Medication Summary:   Medications   albuterol (PROVENTIL) (2.5 MG/3ML) 0.083% nebulizer solution 5 mg (5 mg Nebulization Given 01/28/23 1313)  ipratropium-albuterol (DUONEB) 0.5-2.5 (3) MG/3ML nebulizer solution 3 mL (3 mLs Nebulization Given 01/28/23 1335)  methylPREDNISolone sodium succinate (SOLU-MEDROL) 125 mg/2 mL injection 125 mg (125 mg Intravenous Given 01/28/23 1332)  potassium chloride SA (KLOR-CON M) CR tablet 20 mEq (20 mEq Oral Given 01/28/23 1423)  albuterol (PROVENTIL) (2.5 MG/3ML) 0.083% nebulizer solution 5 mg (5 mg Nebulization Given 01/28/23 1528)            Tretha Sciara, MD 01/28/23 1635

## 2023-01-28 NOTE — ED Triage Notes (Signed)
Patient here POV from Home.  Endorses SOB that began this AM. Was in ED yesterday for Asthma Exacerbation and diagnosed with Flu B and RSV. Did use Inhaler today and provided not much relief. Warm Shower also not very effective.   Possible Fever.   NAD noted during Triage. A&Ox4. Gcs 15. Ambulatory.

## 2023-01-28 NOTE — ED Provider Notes (Signed)
[facility] Provider Note   CSN: [id number] Arrival date & time: 01/28/23  1253     History {Add pertinent medical, surgical, social history, OB history to HPI:1} Chief Complaint  Patient presents with   Shortness of Breath    Tilton Cata is a 32 y.o. male with asthma, tobacco/marijuana use who presents with shortness of breath.   Patient was seen in the emergency department yesterday for asthma exacerbation and diagnosed with flu B and RSV.  He has been coughing and wheezing with mild shortness of breath since the third.  He does use a daily inhaler and was given more inhaler yesterday which after he got home did not help very much.  He notes that he got up this morning and got into the shower and could not catch his breath.  Tried to go to work but was so short of breath he came to the ED.  He endorses left shoulder pain that seems to happen every time he gets short of breath, that is not abnormal for him.  Albuterol and allergy did not help.  Thinks he maybe had a fever yesterday.  Endorses productive cough yellow sputum.  Has never been intubated for his asthma before. Denies leg swelling or h/o DVT/PE.    Shortness of Breath      Home Medications Prior to Admission medications   Medication Sig Start Date End Date Taking? Authorizing Provider  acetaminophen (TYLENOL) 500 MG tablet Take 1 tablet (500 mg total) by mouth every 6 (six) hours as needed. 01/27/23   Domenic Moras, PA-C  albuterol (PROVENTIL) (2.5 MG/3ML) 0.083% nebulizer solution Take 3 mLs (2.5 mg total) by nebulization every 6 (six) hours as needed for wheezing or shortness of breath. 09/07/22   McElwee, Lauren A, NP  albuterol (VENTOLIN HFA) 108 (90 Base) MCG/ACT inhaler Inhale 2 puffs into the lungs every 6 (six) hours as needed for wheezing or shortness of breath. 09/07/22   McElwee, Lauren A, NP  benzonatate (TESSALON) 100 MG capsule Take 1 capsule (100 mg total) by mouth  every 8 (eight) hours. 01/27/23   Domenic Moras, PA-C  budesonide-formoterol [facility]) 160-4.5 MCG/ACT inhaler Inhale 2 puffs into the lungs 2 (two) times daily. 10/20/22   Hunsucker, Bonna Gains, MD  guaiFENesin 200 MG tablet Take 1 tablet (200 mg total) by mouth every 4 (four) hours as needed for cough or to loosen phlegm. 01/27/23   Domenic Moras, PA-C  promethazine (PHENERGAN) 25 MG tablet Take 1 tablet (25 mg total) by mouth every 6 (six) hours as needed for nausea or vomiting. 09/23/17 12/25/19  Orpah Greek, MD      Allergies    Patient has no known allergies.    Review of Systems   Review of Systems  Respiratory:  Positive for shortness of breath.    Review of systems Positive for SOB, negative for CP.  A 10 point review of systems was performed and is negative unless otherwise reported in HPI.  Physical Exam Updated Vital Signs BP (!) 155/83 (BP Location: Right Arm)   Pulse (!) 110   Temp 98.5 F (36.9 C) (Oral)   Resp 20   Ht '5\' 5"'$  (1.651 m)   Wt 81.6 kg   SpO2 93%   BMI 29.94 kg/m  Physical Exam General: Normal appearing male, lying in bed.  No significant distress. HEENT: Sclera anicteric, MMM, trachea midline.  Cardiology: Tachycardic but regular rate, no murmurs/rubs/gallops. BL radial and DP pulses equal  bilaterally.  Resp: Normal respiratory effort though rate is increased and patient is breathing in the middle of a sentence.  Poor air movement bilaterally with inspiratory and expiratory wheezing.  Abd: Soft, non-tender, non-distended. No rebound tenderness or guarding.  GU: Deferred. MSK: No peripheral edema or signs of trauma. Extremities without deformity or TTP. No cyanosis or clubbing. Skin: warm, dry. No rashes or lesions. Neuro: A&Ox4, CNs II-XII grossly intact. MAEs. Sensation grossly intact.  Psych: Normal mood and affect.   ED Results / Procedures / Treatments   Labs (all labs ordered are listed, but only abnormal results are displayed) Labs Reviewed   CBC WITH DIFFERENTIAL/PLATELET  BASIC METABOLIC PANEL    EKG None  Radiology DG Chest Portable 1 View  Result Date: 01/27/2023 CLINICAL DATA:  Shortness of breath for four days. EXAM: PORTABLE CHEST 1 VIEW COMPARISON:  01/22/2023 and prior radiographs FINDINGS: Telemetry leads overlie the chest. The cardiomediastinal silhouette is unremarkable. There is no evidence of focal airspace disease, pulmonary edema, suspicious pulmonary nodule/mass, pleural effusion, or pneumothorax. No acute bony abnormalities are identified. IMPRESSION: No active disease. Electronically Signed   By: Margarette Canada M.D.   On: 01/27/2023 13:23    Procedures Procedures  {Document cardiac monitor, telemetry assessment procedure when appropriate:1}  Medications Ordered in ED Medications  ipratropium-albuterol (DUONEB) 0.5-2.5 (3) MG/3ML nebulizer solution 3 mL (has no administration in time range)  methylPREDNISolone sodium succinate (SOLU-MEDROL) 125 mg/2 mL injection 125 mg (has no administration in time range)  albuterol (PROVENTIL) (2.5 MG/3ML) 0.083% nebulizer solution 5 mg (5 mg Nebulization Given 01/28/23 1313)    ED Course/ Medical Decision Making/ A&P                          Medical Decision Making Amount and/or Complexity of Data Reviewed Labs: ordered. Radiology: ordered.  Risk Prescription drug management.    This patient presents to the ED for concern of SOB, this involves an extensive number of treatment options, and is a complaint that carries with it a high risk of complications and morbidity.  I considered the following differential and admission for this acute, potentially life threatening condition. Patient is mildly tachycardic and tachypneic but not hypoxic on RA.   MDM:    Greatest concern for asthma exacerbation in setting of URI known to be positive flu B and RSV diagnosed yesterday.  On chest x-ray yesterday patient did not have any demonstrated pneumonia but he has had worsening  shortness of breath and thinks he did have a fever yesterday, will get another chest x-ray to see if any pneumonia has declared itself over the course of the last 24 hours.  Patient cannot PERC out due to tachycardia, however he has no history of DVT/PE, has no leg swelling, and I do not believe it is the most likely cause of his symptoms in his clinical setting.  Per chart review yesterday he received 60 mg p.o. prednisone, 1 g IV magnesium, and a DuoNeb.  He also had a normal D-dimer yesterday making PE that much less likely.  He was prescribed Tessalon Tylenol and guaifenesin. Will give solumedrol and 3 duonebs here and reevaluate.      Labs: I Ordered, and personally interpreted labs.  The pertinent results include: Those listed above  Imaging Studies ordered: I ordered imaging studies including chest x-ray I independently visualized and interpreted imaging. I agree with the radiologist interpretation  Additional history obtained from chart review.  Cardiac Monitoring: The patient was maintained on a cardiac monitor.  I personally viewed and interpreted the cardiac monitored which showed an underlying rhythm of: sinus tachycardia  Reevaluation: After the interventions noted above, I reevaluated the patient and found that they have :{resolved/improved/worsened:23923::"improved"}  Social Determinants of Health: ***  Disposition:  ***  Co morbidities that complicate the patient evaluation  Past Medical History:  Diagnosis Date   Allergic rhinitis    Bronchitis    Hx of tobacco use, presenting hazards to health    Marijuana abuse    Severe persistent asthma      Medicines Meds ordered this encounter  Medications   albuterol (PROVENTIL) (2.5 MG/3ML) 0.083% nebulizer solution 5 mg   DISCONTD: albuterol (PROVENTIL) (2.5 MG/3ML) 0.083% nebulizer solution    Florene Glen, Robin F: cabinet override   ipratropium-albuterol (DUONEB) 0.5-2.5 (3) MG/3ML nebulizer solution 3 mL    methylPREDNISolone sodium succinate (SOLU-MEDROL) 125 mg/2 mL injection 125 mg    IV methylprednisolone will be converted to either a q12h or q24h frequency with the same total daily dose (TDD).  Ordered Dose: 1 to 125 mg TDD; convert to: TDD q24h.  Ordered Dose: 126 to 250 mg TDD; convert to: TDD div q12h.  Ordered Dose: >250 mg TDD; DAW.    I have reviewed the patients home medicines and have made adjustments as needed  Problem List / ED Course: Problem List Items Addressed This Visit   None        {Document critical care time when appropriate:1} {Document review of labs and clinical decision tools ie heart score, Chads2Vasc2 etc:1}  {Document your independent review of radiology images, and any outside records:1} {Document your discussion with family members, caretakers, and with consultants:1} {Document social determinants of health affecting pt's care:1} {Document your decision making why or why not admission, treatments were needed:1}  This note was created using dictation software, which may contain spelling or grammatical errors.

## 2023-01-28 NOTE — Progress Notes (Signed)
RT placed the Pt on '5mg'$  albuterol. The Pt was coughing and the albuterol pilled out. RT pulled 2 more albuterol to give the Pt. RT will continue to monitor.

## 2023-01-31 ENCOUNTER — Encounter: Payer: Self-pay | Admitting: Nurse Practitioner

## 2023-02-01 ENCOUNTER — Telehealth: Payer: Self-pay

## 2023-02-01 NOTE — Telephone Encounter (Signed)
I spoke with patient and he said that he feels a little better but as he is climbing steps he can feel the SOB. Patient wants to be seen on Friday and appointment was made.

## 2023-02-03 ENCOUNTER — Inpatient Hospital Stay: Payer: Self-pay | Admitting: Nurse Practitioner

## 2023-02-03 ENCOUNTER — Telehealth: Payer: Self-pay | Admitting: Nurse Practitioner

## 2023-02-03 NOTE — Telephone Encounter (Signed)
Pt was a no show for a hosp f/up with Lauren on 02/03/23, I sent a no show letter.

## 2023-02-08 NOTE — Telephone Encounter (Signed)
2nd no show, fee generated, letter sent/text sent

## 2023-02-08 NOTE — Telephone Encounter (Signed)
Noted  

## 2023-03-03 ENCOUNTER — Other Ambulatory Visit: Payer: Self-pay

## 2023-03-03 ENCOUNTER — Emergency Department (HOSPITAL_BASED_OUTPATIENT_CLINIC_OR_DEPARTMENT_OTHER)
Admission: EM | Admit: 2023-03-03 | Discharge: 2023-03-03 | Disposition: A | Discharge status: Home / Self Care | Payer: Self-pay | Attending: Emergency Medicine | Admitting: Emergency Medicine

## 2023-03-03 ENCOUNTER — Encounter (HOSPITAL_BASED_OUTPATIENT_CLINIC_OR_DEPARTMENT_OTHER): Payer: Self-pay

## 2023-03-03 DIAGNOSIS — Z113 Encounter for screening for infections with a predominantly sexual mode of transmission: Secondary | ICD-10-CM | POA: Insufficient documentation

## 2023-03-03 DIAGNOSIS — Z711 Person with feared health complaint in whom no diagnosis is made: Secondary | ICD-10-CM

## 2023-03-03 LAB — URINALYSIS, ROUTINE W REFLEX MICROSCOPIC
Bilirubin Urine: NEGATIVE
Glucose, UA: NEGATIVE mg/dL
Hgb urine dipstick: NEGATIVE
Ketones, ur: NEGATIVE mg/dL
Leukocytes,Ua: NEGATIVE
Nitrite: NEGATIVE
Protein, ur: NEGATIVE mg/dL
Specific Gravity, Urine: 1.01 (ref 1.005–1.030)
pH: 6 (ref 5.0–8.0)

## 2023-03-03 MED ORDER — DOXYCYCLINE HYCLATE 100 MG PO CAPS: 100.0000 mg | ORAL_CAPSULE | Freq: Two times a day (BID) | ORAL | 0 refills | Status: DC

## 2023-03-03 MED ORDER — DOXYCYCLINE HYCLATE 100 MG PO TABS
100.0000 mg | ORAL_TABLET | Freq: Once | ORAL | Status: AC
  Administered 2023-03-03: 100 mg via ORAL
  Filled 2023-03-03: qty 1

## 2023-03-03 MED ORDER — CEFTRIAXONE SODIUM 500 MG IJ SOLR
500.0000 mg | Freq: Once | INTRAMUSCULAR | Status: AC
  Administered 2023-03-03: 500 mg via INTRAMUSCULAR
  Filled 2023-03-03: qty 500

## 2023-03-03 NOTE — ED Notes (Signed)
Discharge instructions, follow up care, and prescriptions reviewed and explained, pt verbalized understanding.  

## 2023-03-03 NOTE — ED Provider Notes (Signed)
[place] EMERGENCY DEPARTMENT AT [facility] Provider Note   CSN: [id number] Arrival date & time: 03/03/23  1656     History  Chief Complaint  Patient presents with   SEXUALLY TRANSMITTED DISEASE    Wwilliam [name] is a 32 y.o. male.  Patient with no pertinent past medical history presents today requesting STD check. He states that his girlfriend recently found out that she was pregnant and they have not had sexual intercourse in several months and he is therefore suspecting that she has another partner. Last night he woke up and she was having intercourse with him and he is therefore concerned for STDs. He denies any fevers, chills, abdominal pain, nausea, vomiting, penile discharge, or dysuria.  The history is provided by the patient. No language interpreter was used.       Home Medications Prior to Admission medications   Medication Sig Start Date End Date Taking? Authorizing Provider  acetaminophen (TYLENOL) 500 MG tablet Take 1 tablet (500 mg total) by mouth every 6 (six) hours as needed. 01/27/23   Fayrene Helper, PA-C  albuterol (PROVENTIL) (2.5 MG/3ML) 0.083% nebulizer solution Take 3 mLs (2.5 mg total) by nebulization every 6 (six) hours as needed for wheezing or shortness of breath. 09/07/22   McElwee, Lauren A, NP  albuterol (VENTOLIN HFA) 108 (90 Base) MCG/ACT inhaler Inhale 2 puffs into the lungs every 6 (six) hours as needed for wheezing or shortness of breath. 09/07/22   McElwee, Lauren A, NP  benzonatate (TESSALON) 100 MG capsule Take 1 capsule (100 mg total) by mouth every 8 (eight) hours. 01/27/23   Fayrene Helper, PA-C  budesonide-formoterol [facility]) 160-4.5 MCG/ACT inhaler Inhale 2 puffs into the lungs 2 (two) times daily. 10/20/22   Hunsucker, Lesia Sago, MD  guaiFENesin 200 MG tablet Take 1 tablet (200 mg total) by mouth every 4 (four) hours as needed for cough or to loosen phlegm. 01/27/23   Fayrene Helper, PA-C  ipratropium-albuterol (DUONEB) 0.5-2.5 (3) MG/3ML  SOLN Take 3 mLs by nebulization every 6 (six) hours as needed for up to 30 doses. 01/28/23   Glyn Ade, MD  predniSONE (STERAPRED UNI-PAK 21 TAB) 10 MG (21) TBPK tablet Take by mouth daily. Take 6 tabs by mouth daily  for 2 days, then 5 tabs for 2 days, then 4 tabs for 2 days, then 3 tabs for 2 days, 2 tabs for 2 days, then 1 tab by mouth daily for 2 days 01/28/23   Glyn Ade, MD  promethazine (PHENERGAN) 25 MG tablet Take 1 tablet (25 mg total) by mouth every 6 (six) hours as needed for nausea or vomiting. 09/23/17 12/25/19  Gilda Crease, MD      Allergies    Patient has no known allergies.    Review of Systems   Review of Systems  All other systems reviewed and are negative.   Physical Exam Updated Vital Signs BP (!) 153/95 (BP Location: Right Arm)   Pulse 80   Temp (!) 97.4 F (36.3 C)   Resp 18   Ht 5\' 5"  (1.651 m)   Wt 77.1 kg   SpO2 97%   BMI 28.29 kg/m  Physical Exam Vitals and nursing note reviewed.  Constitutional:      General: He is not in acute distress.    Appearance: Normal appearance. He is normal weight. He is not ill-appearing, toxic-appearing or diaphoretic.  HENT:     Head: Normocephalic and atraumatic.  Cardiovascular:     Rate and Rhythm: Normal  rate.  Pulmonary:     Effort: Pulmonary effort is normal. No respiratory distress.  Musculoskeletal:        General: Normal range of motion.     Cervical back: Normal range of motion.  Skin:    General: Skin is warm and dry.  Neurological:     General: No focal deficit present.     Mental Status: He is alert.  Psychiatric:        Mood and Affect: Mood normal.        Behavior: Behavior normal.     ED Results / Procedures / Treatments   Labs (all labs ordered are listed, but only abnormal results are displayed) Labs Reviewed  URINALYSIS, ROUTINE W REFLEX MICROSCOPIC - Abnormal; Notable for the following components:      Result Value   Color, Urine COLORLESS (*)    All other  components within normal limits  GC/CHLAMYDIA PROBE AMP ([place]) NOT AT [facility]    EKG None  Radiology No results found.  Procedures Procedures    Medications Ordered in ED Medications  cefTRIAXone (ROCEPHIN) injection 500 mg (has no administration in time range)  doxycycline (VIBRA-TABS) tablet 100 mg (has no administration in time range)    ED Course/ Medical Decision Making/ A&P                             Medical Decision Making Amount and/or Complexity of Data Reviewed Labs: ordered.  Risk Prescription drug management.   Patient presents today requesting STD check. Patient is afebrile without abdominal tenderness, abdominal pain or painful bowel movements to indicate prostatitis.  Denies testicular pain.  STD cultures obtained including gonorrhea and chlamydia. UA noninfectious. Patient to be discharged with instructions to follow up with PCP. Discussed importance of using protection when sexually active. Pt understands that they have GC/Chlamydia cultures pending and that they will need to inform all sexual partners if results return positive. Patient has been treated prophylactically with doxycycline and Rocephin. Evaluation and diagnostic testing in the emergency department does not suggest an emergent condition requiring admission or immediate intervention beyond what has been performed at this time.  Plan for discharge with close PCP follow-up.  Patient is understanding and amenable with plan, educated on red flag symptoms that would prompt immediate return.  Patient discharged in stable condition.    Final Clinical Impression(s) / ED Diagnoses Final diagnoses:  Concern about STD in male without diagnosis    Rx / DC Orders ED Discharge Orders          Ordered    doxycycline (VIBRAMYCIN) 100 MG capsule  2 times daily        03/03/23 1833          An After Visit Summary was printed and given to the patient.     Vear Clock 03/03/23 1835     Loetta Rough, MD 03/04/23 1350

## 2023-03-03 NOTE — ED Triage Notes (Signed)
Patient here POV from Home.  Endorses being with Partner recently. No Symptoms.   NAD Noted during Triage. A&Ox4. Gcs 15. Ambulatory.

## 2023-03-03 NOTE — Discharge Instructions (Signed)
As we discussed, your gonorrhea and Chlamydia swabs are pending at your discharge today.  You will need to monitor these results online on your MyChart as they take 24 to 48 hours to result.  If they are positive, you will need to inform all sexual partners of a positive test result and abstain from sexual intercourse until you can follow-up with your PCP for a test of cure.  You have chosen to go ahead and proceed with treatment for both gonorrhea and chlamydia and we have given you a shot in the arm in the ER today as well as the first round of the oral antibiotic doxycycline.  I have also given you a prescription for the rest of the doxycycline which you need to fill and take in its entirety.  Follow-up with your PCP.  Return if development of any new or worsening symptoms.

## 2023-03-04 ENCOUNTER — Telehealth: Payer: Self-pay | Admitting: Nurse Practitioner

## 2023-03-04 DIAGNOSIS — Z712 Person consulting for explanation of examination or test findings: Secondary | ICD-10-CM

## 2023-03-04 DIAGNOSIS — Z7251 High risk heterosexual behavior: Secondary | ICD-10-CM

## 2023-03-04 NOTE — Progress Notes (Signed)
Virtual Visit Consent   Steven [name], you are scheduled for a virtual visit with a [facility] provider today. Just as with appointments in the office, your consent must be obtained to participate. Your consent will be active for this visit and any virtual visit you may have with one of our providers in the next 365 days. If you have a MyChart account, a copy of this consent can be sent to you electronically.  As this is a virtual visit, video technology does not allow for your provider to perform a traditional examination. This may limit your provider's ability to fully assess your condition. If your provider identifies any concerns that need to be evaluated in person or the need to arrange testing (such as labs, EKG, etc.), we will make arrangements to do so. Although advances in technology are sophisticated, we cannot ensure that it will always work on either your end or our end. If the connection with a video visit is poor, the visit may have to be switched to a telephone visit. With either a video or telephone visit, we are not always able to ensure that we have a secure connection.  By engaging in this virtual visit, you consent to the provision of healthcare and authorize for your insurance to be billed (if applicable) for the services provided during this visit. Depending on your insurance coverage, you may receive a charge related to this service.  I need to obtain your verbal consent now. Are you willing to proceed with your visit today? Steven [name] has provided verbal consent on 03/04/2023 for a virtual visit (video or telephone). Claiborne Rigg, NP  Date: 03/04/2023 3:20 PM  Virtual Visit via Video Note   I, Claiborne Rigg, connected with  Steven [name]  ([id number], [birth year]) on 03/04/23 at  3:15 PM EDT by a video-enabled telemedicine application and verified that I am speaking with the correct person using two identifiers.  Location: Patient: Virtual Visit Location Patient:  Home Provider: Virtual Visit Location Provider: Home Office   I discussed the limitations of evaluation and management by telemedicine and the availability of in person appointments. The patient expressed understanding and agreed to proceed.    History of Present Illness: Steven [name] is a 32 y.o. who identifies as a male who was assigned male at birth, and is being seen today for STD testing results.  Mr. Campi is requesting results of his recent STD testing. He was instructed that these results are still pending at this time. He was prescribed doxy prophylactically after his ED visit yesterday but states this has not been picked up yet.  PER ED NOTE: Patient with no pertinent past medical history presents today requesting STD check. He states that his girlfriend recently found out that she was pregnant and they have not had sexual intercourse in several months and he is therefore suspecting that she has another partner. Last night he woke up and she was having intercourse with him and he is therefore concerned for STDs. He denies any fevers, chills, abdominal pain, nausea, vomiting, penile discharge, or dysuria  Problems:  Patient Active Problem List   Diagnosis Date Noted   Enlarged thyroid 10/05/2022   Elevated blood pressure reading 10/05/2022   Asthma 09/07/2022   Non compliance with medical treatment 12/12/2016   Sinus tachycardia 09/15/2012   Marijuana abuse    Hx of tobacco use, presenting hazards to health     Allergies: No Known Allergies Medications:  Current Outpatient Medications:  acetaminophen (TYLENOL) 500 MG tablet, Take 1 tablet (500 mg total) by mouth every 6 (six) hours as needed., Disp: 30 tablet, Rfl: 0   albuterol (PROVENTIL) (2.5 MG/3ML) 0.083% nebulizer solution, Take 3 mLs (2.5 mg total) by nebulization every 6 (six) hours as needed for wheezing or shortness of breath., Disp: 75 mL, Rfl: 12   albuterol (VENTOLIN HFA) 108 (90 Base) MCG/ACT inhaler, Inhale 2  puffs into the lungs every 6 (six) hours as needed for wheezing or shortness of breath., Disp: 8 g, Rfl: 2   benzonatate (TESSALON) 100 MG capsule, Take 1 capsule (100 mg total) by mouth every 8 (eight) hours., Disp: 21 capsule, Rfl: 0   budesonide-formoterol (SYMBICORT) 160-4.5 MCG/ACT inhaler, Inhale 2 puffs into the lungs 2 (two) times daily., Disp: 1 each, Rfl: 12   doxycycline (VIBRAMYCIN) 100 MG capsule, Take 1 capsule (100 mg total) by mouth 2 (two) times daily., Disp: 19 capsule, Rfl: 0   guaiFENesin 200 MG tablet, Take 1 tablet (200 mg total) by mouth every 4 (four) hours as needed for cough or to loosen phlegm., Disp: 30 suppository, Rfl: 0   ipratropium-albuterol (DUONEB) 0.5-2.5 (3) MG/3ML SOLN, Take 3 mLs by nebulization every 6 (six) hours as needed for up to 30 doses., Disp: 90 mL, Rfl: 0   predniSONE (STERAPRED UNI-PAK 21 TAB) 10 MG (21) TBPK tablet, Take by mouth daily. Take 6 tabs by mouth daily  for 2 days, then 5 tabs for 2 days, then 4 tabs for 2 days, then 3 tabs for 2 days, 2 tabs for 2 days, then 1 tab by mouth daily for 2 days, Disp: 42 tablet, Rfl: 0  Observations/Objective: Patient is well-developed, well-nourished in no acute distress.  Resting comfortably  at home.  Head is normocephalic, atraumatic.  No labored breathing.  Speech is clear and coherent with logical content.  Patient is alert and oriented at baseline.    Assessment and Plan: 1. High risk heterosexual behavior Pick up doxy from pharmacy. Still waiting GC/Chlamydia probe results  Follow Up Instructions: I discussed the assessment and treatment plan with the patient. The patient was provided an opportunity to ask questions and all were answered. The patient agreed with the plan and demonstrated an understanding of the instructions.  A copy of instructions were sent to the patient via MyChart unless otherwise noted below.    The patient was advised to call back or seek an in-person evaluation if the  symptoms worsen or if the condition fails to improve as anticipated.  Time:  I spent 7 minutes with the patient via telehealth technology discussing the above problems/concerns.    Claiborne Rigg, NP

## 2023-03-04 NOTE — Patient Instructions (Signed)
Denver Faster, thank you for joining Claiborne Rigg, NP for today's virtual visit.  While this provider is not your primary care provider (PCP), if your PCP is located in our provider database this encounter information will be shared with them immediately following your visit.   A [facility] MyChart account gives you access to today's visit and all your visits, tests, and labs performed at [facility] " click here if you don't have a [facility] MyChart account or go to [email or web address]  Consent: (Patient) Steven [name] provided verbal consent for this virtual visit at the beginning of the encounter.  Current Medications:  Current Outpatient Medications:    acetaminophen (TYLENOL) 500 MG tablet, Take 1 tablet (500 mg total) by mouth every 6 (six) hours as needed., Disp: 30 tablet, Rfl: 0   albuterol (PROVENTIL) (2.5 MG/3ML) 0.083% nebulizer solution, Take 3 mLs (2.5 mg total) by nebulization every 6 (six) hours as needed for wheezing or shortness of breath., Disp: 75 mL, Rfl: 12   albuterol (VENTOLIN HFA) 108 (90 Base) MCG/ACT inhaler, Inhale 2 puffs into the lungs every 6 (six) hours as needed for wheezing or shortness of breath., Disp: 8 g, Rfl: 2   benzonatate (TESSALON) 100 MG capsule, Take 1 capsule (100 mg total) by mouth every 8 (eight) hours., Disp: 21 capsule, Rfl: 0   budesonide-formoterol (SYMBICORT) 160-4.5 MCG/ACT inhaler, Inhale 2 puffs into the lungs 2 (two) times daily., Disp: 1 each, Rfl: 12   doxycycline (VIBRAMYCIN) 100 MG capsule, Take 1 capsule (100 mg total) by mouth 2 (two) times daily., Disp: 19 capsule, Rfl: 0   guaiFENesin 200 MG tablet, Take 1 tablet (200 mg total) by mouth every 4 (four) hours as needed for cough or to loosen phlegm., Disp: 30 suppository, Rfl: 0   ipratropium-albuterol (DUONEB) 0.5-2.5 (3) MG/3ML SOLN, Take 3 mLs by nebulization every 6 (six) hours as needed for up to 30 doses., Disp: 90 mL, Rfl: 0   predniSONE (STERAPRED UNI-PAK  21 TAB) 10 MG (21) TBPK tablet, Take by mouth daily. Take 6 tabs by mouth daily  for 2 days, then 5 tabs for 2 days, then 4 tabs for 2 days, then 3 tabs for 2 days, 2 tabs for 2 days, then 1 tab by mouth daily for 2 days, Disp: 42 tablet, Rfl: 0   Medications ordered in this encounter:  No orders of the defined types were placed in this encounter.    *If you need refills on other medications prior to your next appointment, please contact your pharmacy*  Follow-Up: Call back or seek an in-person evaluation if the symptoms worsen or if the condition fails to improve as anticipated.  [facility] [phone]  Other Instructions Will be notified by ED of results    If you have been instructed to have an in-person evaluation today at a local Urgent Care facility, please use the link below. It will take you to a list of all of our available [facility], including address, phone number and hours of operation. Please do not delay care.  [facility]  If you or a family member do not have a primary care provider, use the link below to schedule a visit and establish care. When you choose a [facility] primary care physician or advanced practice provider, you gain a long-term partner in health. Find a Primary Care Provider  Learn more about [facility]'s in-office and virtual care options: [facility] - Get  Care Now

## 2023-03-07 LAB — GC/CHLAMYDIA PROBE AMP (~~LOC~~) NOT AT ARMC
Chlamydia: POSITIVE — AB
Comment: NEGATIVE
Comment: NORMAL
Neisseria Gonorrhea: NEGATIVE

## 2023-03-14 ENCOUNTER — Telehealth: Payer: Self-pay | Admitting: Physician Assistant

## 2023-03-14 ENCOUNTER — Encounter: Payer: Self-pay | Admitting: Physician Assistant

## 2023-03-14 DIAGNOSIS — J4550 Severe persistent asthma, uncomplicated: Secondary | ICD-10-CM

## 2023-03-14 NOTE — Progress Notes (Signed)
Patient in [place], where we are not licensed to provide care. Sent for [place] video visit to connect with an [place] licensed provider. No charge.

## 2023-03-14 NOTE — Patient Instructions (Signed)
HI Alinda Money,  Go to [email or web address] to register for an [place] video visit, selecting your current state [facility]) to be connected with a provider licensed there.

## 2023-04-11 ENCOUNTER — Other Ambulatory Visit: Payer: Self-pay

## 2023-04-11 ENCOUNTER — Emergency Department (HOSPITAL_BASED_OUTPATIENT_CLINIC_OR_DEPARTMENT_OTHER)
Admission: EM | Admit: 2023-04-11 | Discharge: 2023-04-11 | Disposition: A | Discharge status: Home / Self Care | Payer: Self-pay | Attending: Emergency Medicine | Admitting: Emergency Medicine

## 2023-04-11 ENCOUNTER — Emergency Department (HOSPITAL_BASED_OUTPATIENT_CLINIC_OR_DEPARTMENT_OTHER): Payer: Self-pay | Admitting: Radiology

## 2023-04-11 DIAGNOSIS — J4541 Moderate persistent asthma with (acute) exacerbation: Secondary | ICD-10-CM

## 2023-04-11 DIAGNOSIS — J45909 Unspecified asthma, uncomplicated: Secondary | ICD-10-CM | POA: Insufficient documentation

## 2023-04-11 DIAGNOSIS — R0602 Shortness of breath: Secondary | ICD-10-CM | POA: Insufficient documentation

## 2023-04-11 DIAGNOSIS — R059 Cough, unspecified: Secondary | ICD-10-CM | POA: Insufficient documentation

## 2023-04-11 MED ORDER — IPRATROPIUM-ALBUTEROL 0.5-2.5 (3) MG/3ML IN SOLN
3.0000 mL | Freq: Once | RESPIRATORY_TRACT | Status: AC
  Administered 2023-04-11: 3 mL via RESPIRATORY_TRACT
  Filled 2023-04-11: qty 3

## 2023-04-11 MED ORDER — PREDNISONE 10 MG PO TABS: 20.0000 mg | ORAL_TABLET | Freq: Two times a day (BID) | ORAL | 0 refills | Status: AC

## 2023-04-11 MED ORDER — ALBUTEROL SULFATE (2.5 MG/3ML) 0.083% IN NEBU
2.5000 mg | INHALATION_SOLUTION | Freq: Once | RESPIRATORY_TRACT | Status: AC
  Administered 2023-04-11: 2.5 mg via RESPIRATORY_TRACT
  Filled 2023-04-11: qty 3

## 2023-04-11 MED ORDER — PREDNISONE 50 MG PO TABS
60.0000 mg | ORAL_TABLET | Freq: Once | ORAL | Status: AC
  Administered 2023-04-11: 60 mg via ORAL
  Filled 2023-04-11: qty 1

## 2023-04-11 NOTE — Discharge Instructions (Signed)
Begin taking prednisone as prescribed.  Continue use of albuterol by nebulizer every 4 hours as needed.  Return to the ER if symptoms worsen or change.

## 2023-04-11 NOTE — ED Provider Notes (Signed)
[place] EMERGENCY DEPARTMENT AT [facility] Provider Note   CSN: [id number] Arrival date & time: 04/11/23  8413     History  Chief Complaint  Patient presents with   Shortness of Breath    Steven [name] is a 32 y.o. male.  Patient is a 32 year old male with history of asthma.  He presents today with a 2-day history of wheezing and cough and shortness of breath.  He has been using his albuterol inhaler and nebulizer at home with little relief.  No fevers or chills.  No chest pain.  He denies any ill contacts.  The history is provided by the patient.       Home Medications Prior to Admission medications   Medication Sig Start Date End Date Taking? Authorizing Provider  acetaminophen (TYLENOL) 500 MG tablet Take 1 tablet (500 mg total) by mouth every 6 (six) hours as needed. 01/27/23   Fayrene Helper, PA-C  albuterol (PROVENTIL) (2.5 MG/3ML) 0.083% nebulizer solution Take 3 mLs (2.5 mg total) by nebulization every 6 (six) hours as needed for wheezing or shortness of breath. 09/07/22   McElwee, Lauren A, NP  albuterol (VENTOLIN HFA) 108 (90 Base) MCG/ACT inhaler Inhale 2 puffs into the lungs every 6 (six) hours as needed for wheezing or shortness of breath. 09/07/22   McElwee, Lauren A, NP  benzonatate (TESSALON) 100 MG capsule Take 1 capsule (100 mg total) by mouth every 8 (eight) hours. 01/27/23   Fayrene Helper, PA-C  budesonide-formoterol [facility]) 160-4.5 MCG/ACT inhaler Inhale 2 puffs into the lungs 2 (two) times daily. 10/20/22   Hunsucker, Lesia Sago, MD  doxycycline (VIBRAMYCIN) 100 MG capsule Take 1 capsule (100 mg total) by mouth 2 (two) times daily. 03/03/23   Smoot, Sarah A, PA-C  guaiFENesin 200 MG tablet Take 1 tablet (200 mg total) by mouth every 4 (four) hours as needed for cough or to loosen phlegm. 01/27/23   Fayrene Helper, PA-C  ipratropium-albuterol (DUONEB) 0.5-2.5 (3) MG/3ML SOLN Take 3 mLs by nebulization every 6 (six) hours as needed for up to 30 doses. 01/28/23    Glyn Ade, MD  promethazine (PHENERGAN) 25 MG tablet Take 1 tablet (25 mg total) by mouth every 6 (six) hours as needed for nausea or vomiting. 09/23/17 12/25/19  Gilda Crease, MD      Allergies    Patient has no known allergies.    Review of Systems   Review of Systems  All other systems reviewed and are negative.   Physical Exam Updated Vital Signs BP (!) 150/90   Pulse 89   Temp 97.9 F (36.6 C) (Oral)   Resp 20   SpO2 95%  Physical Exam Vitals and nursing note reviewed.  Constitutional:      General: He is not in acute distress.    Appearance: He is well-developed. He is not diaphoretic.  HENT:     Head: Normocephalic and atraumatic.  Cardiovascular:     Rate and Rhythm: Normal rate and regular rhythm.     Heart sounds: No murmur heard.    No friction rub.  Pulmonary:     Effort: Pulmonary effort is normal. No respiratory distress.     Breath sounds: Examination of the right-middle field reveals rhonchi. Examination of the left-middle field reveals rhonchi. Rhonchi present. No wheezing or rales.  Abdominal:     General: Bowel sounds are normal. There is no distension.     Palpations: Abdomen is soft.     Tenderness: There is no abdominal  tenderness.  Musculoskeletal:        General: Normal range of motion.     Cervical back: Normal range of motion and neck supple.  Skin:    General: Skin is warm and dry.  Neurological:     Mental Status: He is alert and oriented to person, place, and time.     Coordination: Coordination normal.     ED Results / Procedures / Treatments   Labs (all labs ordered are listed, but only abnormal results are displayed) Labs Reviewed - No data to display  EKG None  Radiology No results found.  Procedures Procedures    Medications Ordered in ED Medications  ipratropium-albuterol (DUONEB) 0.5-2.5 (3) MG/3ML nebulizer solution 3 mL (has no administration in time range)  albuterol (PROVENTIL) (2.5 MG/3ML)  0.083% nebulizer solution 2.5 mg (has no administration in time range)  predniSONE (DELTASONE) tablet 60 mg (has no administration in time range)    ED Course/ Medical Decision Making/ A&P  Patient with history of asthma presenting with shortness of breath and wheezing.  Patient arrives here with stable vital signs.  There is no hypoxia or fever.  Physical examination does reveal expiratory wheezing bilaterally.  Chest x-ray shows no acute process.  DuoNeb x 2 has been administered and patient feels significantly improved.  He was also given 60 mg of prednisone.  Patient seems appropriate for discharge.  He has been reexamined and wheezing is significantly improved.  I will prescribe a course of prednisone and have him continue use of his inhalers at home.  To return as needed.  Final Clinical Impression(s) / ED Diagnoses Final diagnoses:  None    Rx / DC Orders ED Discharge Orders     None         Geoffery Lyons, MD 04/11/23 [phone]

## 2023-04-11 NOTE — ED Triage Notes (Signed)
Shortness of breath for two days. +Cough, has been using inhaler and neb without relief.

## 2023-05-02 ENCOUNTER — Emergency Department (HOSPITAL_BASED_OUTPATIENT_CLINIC_OR_DEPARTMENT_OTHER)
Admission: EM | Admit: 2023-05-02 | Discharge: 2023-05-02 | Disposition: A | Discharge status: Home / Self Care | Payer: Self-pay | Attending: Emergency Medicine | Admitting: Emergency Medicine

## 2023-05-02 ENCOUNTER — Other Ambulatory Visit: Payer: Self-pay

## 2023-05-02 ENCOUNTER — Encounter (HOSPITAL_BASED_OUTPATIENT_CLINIC_OR_DEPARTMENT_OTHER): Payer: Self-pay

## 2023-05-02 DIAGNOSIS — B9689 Other specified bacterial agents as the cause of diseases classified elsewhere: Secondary | ICD-10-CM

## 2023-05-02 DIAGNOSIS — J019 Acute sinusitis, unspecified: Secondary | ICD-10-CM | POA: Insufficient documentation

## 2023-05-02 DIAGNOSIS — Z20822 Contact with and (suspected) exposure to covid-19: Secondary | ICD-10-CM | POA: Insufficient documentation

## 2023-05-02 LAB — SARS CORONAVIRUS 2 BY RT PCR: SARS Coronavirus 2 by RT PCR: NEGATIVE

## 2023-05-02 MED ORDER — AMOXICILLIN-POT CLAVULANATE 875-125 MG PO TABS: 1.0000 | ORAL_TABLET | Freq: Two times a day (BID) | ORAL | 0 refills | Status: AC

## 2023-05-02 NOTE — ED Triage Notes (Signed)
Patient here POV from Home  Notes a Mostly consistent Headache, Throbbing, for 4 Days. No Changes in Vision, No N/V. No Fevers.  NAD Noted during Triage. A&Ox4. GCS 15. Ambulatory.

## 2023-05-02 NOTE — ED Notes (Signed)
Discharge instructions, follow up care, and prescription reviewed and explained, pt verbalized understanding and had no further questions on d/c. Pt caox4, ambulatory, NAD on d/c.  

## 2023-05-02 NOTE — Discharge Instructions (Signed)
You are seen in the emergency department for headaches.  After discussing her symptoms with you, your symptoms are highly consistent with an acute bacterial sinusitis.  I sent a prescription for Augmentin which you should take twice daily for the next 7 days.  If you have any acute worsening or symptoms please return the emergency department.  You can manage pain with over-the-counter pain medication such as Tylenol, ibuprofen, Aleve.  If you have any concerns, please establish care with a primary care provider for further evaluation as well.  I provided you information with [facility] and wellness which she may reach out to.

## 2023-05-02 NOTE — ED Provider Notes (Signed)
[place] EMERGENCY DEPARTMENT AT [facility] Provider Note   CSN: [id number] Arrival date & time: 05/02/23  1129     History Chief Complaint  Patient presents with   Headache    Steven [name] is a 32 y.o. male.  Patient presents emergency department complaints of a headache.  He reports that his headache has been throbbing in nature and intermittently present.  Reports that typically pain flares up with coughing, sneezing, bending forward.  Prior history of sinus infections.  Not currently being treated for sinus infection no recent antibiotic use.  Denies any unilateral weakness or numbness.  No prior history of strokes.  Not on blood thinners.  Denies any recent fevers, general malaise, sore throat.   Headache      Home Medications Prior to Admission medications   Medication Sig Start Date End Date Taking? Authorizing Provider  amoxicillin-clavulanate (AUGMENTIN) 875-125 MG tablet Take 1 tablet by mouth every 12 (twelve) hours for 7 days. 05/02/23 05/09/23 Yes Smitty Knudsen, PA-C  acetaminophen (TYLENOL) 500 MG tablet Take 1 tablet (500 mg total) by mouth every 6 (six) hours as needed. 01/27/23   Fayrene Helper, PA-C  albuterol (PROVENTIL) (2.5 MG/3ML) 0.083% nebulizer solution Take 3 mLs (2.5 mg total) by nebulization every 6 (six) hours as needed for wheezing or shortness of breath. 09/07/22   McElwee, Lauren A, NP  albuterol (VENTOLIN HFA) 108 (90 Base) MCG/ACT inhaler Inhale 2 puffs into the lungs every 6 (six) hours as needed for wheezing or shortness of breath. 09/07/22   McElwee, Lauren A, NP  benzonatate (TESSALON) 100 MG capsule Take 1 capsule (100 mg total) by mouth every 8 (eight) hours. 01/27/23   Fayrene Helper, PA-C  budesonide-formoterol [facility]) 160-4.5 MCG/ACT inhaler Inhale 2 puffs into the lungs 2 (two) times daily. 10/20/22   Hunsucker, Lesia Sago, MD  doxycycline (VIBRAMYCIN) 100 MG capsule Take 1 capsule (100 mg total) by mouth 2 (two) times daily. 03/03/23    Smoot, Sarah A, PA-C  guaiFENesin 200 MG tablet Take 1 tablet (200 mg total) by mouth every 4 (four) hours as needed for cough or to loosen phlegm. 01/27/23   Fayrene Helper, PA-C  ipratropium-albuterol (DUONEB) 0.5-2.5 (3) MG/3ML SOLN Take 3 mLs by nebulization every 6 (six) hours as needed for up to 30 doses. 01/28/23   Glyn Ade, MD  predniSONE (DELTASONE) 10 MG tablet Take 2 tablets (20 mg total) by mouth 2 (two) times daily with a meal. 04/11/23   Geoffery Lyons, MD  promethazine (PHENERGAN) 25 MG tablet Take 1 tablet (25 mg total) by mouth every 6 (six) hours as needed for nausea or vomiting. 09/23/17 12/25/19  Gilda Crease, MD      Allergies    Patient has no known allergies.    Review of Systems   Review of Systems  Neurological:  Positive for headaches.  All other systems reviewed and are negative.   Physical Exam Updated Vital Signs BP (!) 145/99   Pulse 75   Temp 98 F (36.7 C) (Oral)   Resp 18   Ht 5\' 5"  (1.651 m)   Wt 77.1 kg   SpO2 97%   BMI 28.29 kg/m  Physical Exam Vitals and nursing note reviewed.  Constitutional:      General: He is not in acute distress.    Appearance: He is well-developed.  HENT:     Head: Normocephalic and atraumatic.     Comments: Numbness to palpation of frontal maxillary sinuses.  Worsened pain  when leaning forward.  Also endorsing some dental pain with mastication.  Will discharge of a yellow/gold color. Eyes:     Conjunctiva/sclera: Conjunctivae normal.  Cardiovascular:     Rate and Rhythm: Normal rate and regular rhythm.     Heart sounds: No murmur heard. Pulmonary:     Effort: Pulmonary effort is normal. No respiratory distress.     Breath sounds: Normal breath sounds.  Abdominal:     Palpations: Abdomen is soft.     Tenderness: There is no abdominal tenderness.  Musculoskeletal:        General: No swelling.     Cervical back: Neck supple.  Skin:    General: Skin is warm and dry.     Capillary Refill: Capillary  refill takes less than 2 seconds.  Neurological:     Mental Status: He is alert.  Psychiatric:        Mood and Affect: Mood normal.     ED Results / Procedures / Treatments   Labs (all labs ordered are listed, but only abnormal results are displayed) Labs Reviewed  SARS CORONAVIRUS 2 BY RT PCR    EKG None  Radiology No results found.  Procedures Procedures   Medications Ordered in ED Medications - No data to display ED Course/ Medical Decision Making/ A&P                           Medical Decision Making Risk Prescription drug management.   This patient presents to the ED for concern of headache.  Differential diagnosis includes tension headache, migraine headache, bacterial sinusitis, viral URI, pharyngitis   Lab Tests:  I Ordered, and personally interpreted labs.  The pertinent results include: COVID-19 pending    Problem List / ED Course:  Patient presents to the emergency department complaints of headache.  Reports headache is been present for the last 4 days.  Endorses some throbbing sensation denies any vision changes, nausea, vomiting, fevers.  Reports that he has been experiencing sinus drainage for the last 2 weeks or so and has noted that when he coughs, sneezes, leans forward that the pressure in his head increases.  No prior history of any strokes or clots.  Low concern for intracranial pathology as this appears to be highly consistent with sinusitis. COVID-19 swab collected but results are pending.  Given the patient's symptoms are highly consistent with acute bacterial sinusitis, will send prescription for Augmentin to patient's pharmacy to take for the next 7 days.  Advised patient to return the emergency department if he has any acute worsening of his symptoms.  Provided patient with information for [place] [facility] and wellness to establish care with a primary care provider.  Patient agreeable to treatment plan verbalized understanding all return  precautions.  Final Clinical Impression(s) / ED Diagnoses Final diagnoses:  Acute bacterial sinusitis    Rx / DC Orders ED Discharge Orders          Ordered    amoxicillin-clavulanate (AUGMENTIN) 875-125 MG tablet  Every 12 hours        05/02/23 1345              Smitty Knudsen, PA-C 05/02/23 1355    Melene Plan, DO 05/02/23 1416

## 2023-08-02 ENCOUNTER — Ambulatory Visit: Payer: BC Managed Care – PPO | Admitting: Internal Medicine

## 2023-08-02 NOTE — Progress Notes (Deleted)
Name: Steven [name]  MRN/ DOB: [id number], 10-01-1991    Age/ Sex: 32 y.o., male    PCP: Gerre Scull, NP   Reason for Endocrinology Evaluation: MNG     Date of Initial Endocrinology Evaluation: 08/02/2023     HPI: Mr. Steven [name] is a 32 y.o. male with a past medical history of ***. The patient presented for initial endocrinology clinic visit on 08/02/2023 for consultative assistance with his MNG.   Patient was diagnosed with multinodular goiter based on thyroid ultrasound from 09/2022 2 of the nodules met FNA criteria  He is s/p benign FNA of the right thyroid nodule FNA of the isthmic nodule initially shows atypia of undetermined significance (Bethesda category III), Afirma was benign  HISTORY:  Past Medical History:  Past Medical History:  Diagnosis Date   Allergic rhinitis    Bronchitis    Hx of tobacco use, presenting hazards to health    Marijuana abuse    Severe persistent asthma    Past Surgical History:  Past Surgical History:  Procedure Laterality Date   TONSILLECTOMY  2008    Social History:  reports that he has quit smoking. His smoking use included cigarettes. He has never used smokeless tobacco. He reports that he does not currently use alcohol. He reports that he does not currently use drugs after having used the following drugs: Marijuana. Family History: family history includes Asthma in his brother and mother; Cancer in his mother.   HOME MEDICATIONS: Allergies as of 08/02/2023   No Known Allergies      Medication List        Accurate as of August 02, 2023  7:59 AM. If you have any questions, ask your nurse or doctor.          acetaminophen 500 MG tablet Commonly known as: TYLENOL Take 1 tablet (500 mg total) by mouth every 6 (six) hours as needed.   albuterol 108 (90 Base) MCG/ACT inhaler Commonly known as: VENTOLIN HFA Inhale 2 puffs into the lungs every 6 (six) hours as needed for wheezing or shortness of breath.    albuterol (2.5 MG/3ML) 0.083% nebulizer solution Commonly known as: PROVENTIL Take 3 mLs (2.5 mg total) by nebulization every 6 (six) hours as needed for wheezing or shortness of breath.   benzonatate 100 MG capsule Commonly known as: TESSALON Take 1 capsule (100 mg total) by mouth every 8 (eight) hours.   budesonide-formoterol 160-4.5 MCG/ACT inhaler Commonly known as: Symbicort Inhale 2 puffs into the lungs 2 (two) times daily.   doxycycline 100 MG capsule Commonly known as: VIBRAMYCIN Take 1 capsule (100 mg total) by mouth 2 (two) times daily.   guaiFENesin 200 MG tablet Take 1 tablet (200 mg total) by mouth every 4 (four) hours as needed for cough or to loosen phlegm.   ipratropium-albuterol 0.5-2.5 (3) MG/3ML Soln Commonly known as: DUONEB Take 3 mLs by nebulization every 6 (six) hours as needed for up to 30 doses.   predniSONE 10 MG tablet Commonly known as: DELTASONE Take 2 tablets (20 mg total) by mouth 2 (two) times daily with a meal.          REVIEW OF SYSTEMS: A comprehensive ROS was conducted with the patient and is negative except as per HPI and below:  ROS     OBJECTIVE:  VS: There were no vitals taken for this visit.   Wt Readings from Last 3 Encounters:  05/02/23 170 lb (77.1 kg)  03/03/23 170 lb (  77.1 kg)  01/28/23 179 lb 14.3 oz (81.6 kg)     EXAM: General: Pt appears well and is in NAD  Eyes: External eye exam normal without stare, lid lag or exophthalmos.  EOM intact.  PERRL.  Neck: General: Supple without adenopathy. Thyroid: Thyroid size normal.  No goiter or nodules appreciated. No thyroid bruit.  Lungs: Clear with good BS bilat   Heart: Auscultation: RRR.  Abdomen: Soft, nontender  Extremities:  BL LE: No pretibial edema   Mental Status: Judgment, insight: Intact Orientation: Oriented to time, place, and person Mood and affect: No depression, anxiety, or agitation     DATA REVIEWED: ***   Thyroid Ultrasound 10/18/2022    Estimated total number of nodules >/= 1 cm: 2   Number of spongiform nodules >/=  2 cm not described below (TR1): 0   Number of mixed cystic and solid nodules >/= 1.5 cm not described below (TR2): 0   _________________________________________________________   Nodule labeled 1 is a large solid isoechoic TR 3 nodule in the thyroid isthmus measuring up to 5.0 x 4.8 x 3.0 cm. **Given size (>/= 2.5 cm) and appearance, fine needle aspiration of this mildly suspicious nodule should be considered based on TI-RADS criteria.   Nodule labeled 2 is a solid hypoechoic TR 4 nodule along the inferior aspect of the right thyroid lobe that measures 2.0 x 1.5 x 1.1 cm. **Given size (>/= 1.5 cm) and appearance, fine needle aspiration of this moderately suspicious nodule should be considered based on TI-RADS criteria.   IMPRESSION: 1. Diffusely enlarged multinodular thyroid gland. 2. Nodule labeled 1 in the thyroid isthmus (5.0 cm TR 3) meets criteria for biopsy. 3. Nodule labeled 2 in the right thyroid lobe (2.0 cm TR 4) meets criteria for biopsy.   FNA Isthmic Nodule 12/01/2022  Clinical History: Nodule labeled 1 is a large solid isoechoic TR 3  nodule in the thyroid isthmus measuring up to 5.0 x 4.8 x 3.0 cm.  Specimen Submitted:  A. THYROID, ISTHMUS NODULE #1, FINE NEEDLE  ASPIRATION:    FINAL MICROSCOPIC DIAGNOSIS:  - Atypia of undetermined significance (Bethesda category III)     FNA Right Nodule 12/01/2022   Clinical History: Nodule labeled 2 is a solid hypoechoic TR 4 nodule  along the inferior aspect of the right thyroid lobe hat measures 2.0 x  1.5 x 1.1 cm.  Specimen Submitted:  A. THYROID, RT INFERIOR, FINE NEEDLE ASPIRATION:    FINAL MICROSCOPIC DIAGNOSIS:  - Consistent with benign follicular nodule (Bethesda category II)    Afirma Benign    Old records , labs and images have been reviewed.    ASSESSMENT/PLAN/RECOMMENDATIONS:   Multinodular  goiter:    Medications :  Signed electronically by: Lyndle Herrlich, MD  [facility] Endocrinology  [facility] [name] [place], [place] [address] Phone: [phone] FAX: [phone]   CC: Gerre Scull, NP [address] [place] [address] Phone: [phone] Fax: [phone]   Return to Endocrinology clinic as below: Future Appointments  Date Time Provider Department Center  08/02/2023  1:00 PM Mikeya Tomasetti, Konrad Dolores, MD [facility] None

## 2023-08-18 ENCOUNTER — Encounter (HOSPITAL_BASED_OUTPATIENT_CLINIC_OR_DEPARTMENT_OTHER): Payer: Self-pay

## 2023-08-18 ENCOUNTER — Emergency Department (HOSPITAL_BASED_OUTPATIENT_CLINIC_OR_DEPARTMENT_OTHER)
Admission: EM | Admit: 2023-08-18 | Discharge: 2023-08-18 | Disposition: A | Discharge status: Home / Self Care | Payer: 59 | Attending: Emergency Medicine | Admitting: Emergency Medicine

## 2023-08-18 DIAGNOSIS — W449XXA Unspecified foreign body entering into or through a natural orifice, initial encounter: Secondary | ICD-10-CM | POA: Insufficient documentation

## 2023-08-18 DIAGNOSIS — Y9222 Religious institution as the place of occurrence of the external cause: Secondary | ICD-10-CM | POA: Insufficient documentation

## 2023-08-18 DIAGNOSIS — S0501XA Injury of conjunctiva and corneal abrasion without foreign body, right eye, initial encounter: Secondary | ICD-10-CM | POA: Insufficient documentation

## 2023-08-18 DIAGNOSIS — Y93E5 Activity, floor mopping and cleaning: Secondary | ICD-10-CM | POA: Insufficient documentation

## 2023-08-18 MED ORDER — ERYTHROMYCIN 5 MG/GM OP OINT
TOPICAL_OINTMENT | Freq: Once | OPHTHALMIC | Status: AC
  Administered 2023-08-18: 1 via OPHTHALMIC
  Filled 2023-08-18: qty 3.5

## 2023-08-18 MED ORDER — FLUORESCEIN SODIUM 1 MG OP STRP
1.0000 | ORAL_STRIP | Freq: Once | OPHTHALMIC | Status: AC
  Administered 2023-08-18: 1 via OPHTHALMIC
  Filled 2023-08-18: qty 1

## 2023-08-18 MED ORDER — ERYTHROMYCIN 5 MG/GM OP OINT: TOPICAL_OINTMENT | OPHTHALMIC | 0 refills | Status: AC

## 2023-08-18 MED ORDER — TETRACAINE HCL 0.5 % OP SOLN
2.0000 [drp] | Freq: Once | OPHTHALMIC | Status: AC
  Administered 2023-08-18: 2 [drp] via OPHTHALMIC
  Filled 2023-08-18: qty 4

## 2023-08-18 NOTE — ED Notes (Signed)
Discharge instructions discussed with pt. Pt verbalized understanding. Pt stable and ambulatory.  °

## 2023-08-18 NOTE — ED Provider Notes (Signed)
Steven [name]   CSN: [id number] Arrival date & time: 08/18/23  0150     History  Chief Complaint  Patient presents with   Foreign Body in Eye    Steven [name] is a 32 y.o. male.  The history is provided by the patient.  Foreign Body in Eye  He has history of asthma and comes in complaining of debris that got in his eye 2 days ago.  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  He continues to have a foreign body sensation in the right eye.   Home Medications Prior to Admission medications   Medication Sig Start Date End Date Taking? Authorizing Provider  acetaminophen (TYLENOL) 500 MG tablet Take 1 tablet (500 mg total) by mouth every 6 (six) hours as needed. 01/27/23   Fayrene Helper, PA-C  albuterol (PROVENTIL) (2.5 MG/3ML) 0.083% nebulizer solution Take 3 mLs (2.5 mg total) by nebulization every 6 (six) hours as needed for wheezing or shortness of breath. 09/07/22   McElwee, Lauren A, NP  albuterol (VENTOLIN HFA) 108 (90 Base) MCG/ACT inhaler Inhale 2 puffs into the lungs every 6 (six) hours as needed for wheezing or shortness of breath. 09/07/22   McElwee, Lauren A, NP  benzonatate (TESSALON) 100 MG capsule Take 1 capsule (100 mg total) by mouth every 8 (eight) hours. 01/27/23   Fayrene Helper, PA-C  budesonide-formoterol [facility]) 160-4.5 MCG/ACT inhaler Inhale 2 puffs into the lungs 2 (two) times daily. 10/20/22   Hunsucker, Lesia Sago, MD  doxycycline (VIBRAMYCIN) 100 MG capsule Take 1 capsule (100 mg total) by mouth 2 (two) times daily. 03/03/23   Smoot, Sarah A, PA-C  guaiFENesin 200 MG tablet Take 1 tablet (200 mg total) by mouth every 4 (four) hours as needed for cough or to loosen phlegm. 01/27/23   Fayrene Helper, PA-C  ipratropium-albuterol (DUONEB) 0.5-2.5 (3) MG/3ML SOLN Take 3 mLs by nebulization every 6 (six) hours as needed for up to 30 doses. 01/28/23   Glyn Ade, MD   predniSONE (DELTASONE) 10 MG tablet Take 2 tablets (20 mg total) by mouth 2 (two) times daily with a meal. 04/11/23   Geoffery Lyons, MD  promethazine (PHENERGAN) 25 MG tablet Take 1 tablet (25 mg total) by mouth every 6 (six) hours as needed for nausea or vomiting. 09/23/17 12/25/19  Gilda Crease, MD      Allergies    Patient has no known allergies.    Review of Systems   Review of Systems  All other systems reviewed and are negative.   Physical Exam Updated Vital Signs BP (!) 145/102   Pulse 77   Temp 98.3 F (36.8 C) (Temporal)   Resp 18   Ht 5\' 5"  (1.651 m)   Wt 77 kg   SpO2 100%   BMI 28.25 kg/m  Physical Exam Vitals and nursing [name] reviewed.   32 year old male, resting comfortably and in no acute distress. Vital signs are significant for elevated blood pressure. Oxygen saturation is 100%, which is normal. Head is normocephalic and atraumatic. PERRLA, EOMI. Right conjunctiva is injected.  No corneal foreign body seen in anterior chamber is clear.  Upper and lower sacs were inspected after eversion of lids and no foreign bodies were seen. Lungs are clear without rales, wheezes, or rhonchi. Chest is nontender. Heart has regular rate and rhythm without murmur. Abdomen is soft, flat, nontender.  ED Results / Procedures / Treatments    Procedures Procedures    Medications Ordered in ED Medications  erythromycin ophthalmic ointment (has no administration in time range)  tetracaine (PONTOCAINE) 0.5 % ophthalmic solution 2 drop (2 drops Right Eye Given by Other 08/18/23 0522)  fluorescein ophthalmic strip 1 strip (1 strip Right Eye Given by Other 08/18/23 0522)    ED Course/ Medical Decision Making/ A&P                                 Medical Decision Making  Foreign body sensation of the right eye, possible corneal abrasion.  I performed a slit-lamp exam and again [name] no corneal foreign body, anterior chamber clear without cells or flare.  I stained the eye  with fluorescein and examined with a cobalt blue filter and noted numerous punctate areas of increased uptake consistent with corneal abrasion from small particles such as dust.  I ordered erythromycin ophthalmic ointment.  Of [name], I did request visual acuity be checked and patient refused.  I am discharging him with a prescription for erythromycin ophthalmic ointment and I am referring him to ophthalmology if his eye is not improving over the next 3 days.  Final Clinical Impression(s) / ED Diagnoses Final diagnoses:  Inj conjunctiva and corneal abrasion w/o fb, right eye, init    Rx / DC Orders ED Discharge Orders          Ordered    erythromycin ophthalmic ointment        08/18/23 0541              Dione Booze, MD 08/18/23 [phone]

## 2023-08-18 NOTE — ED Triage Notes (Signed)
Pt states he was cleaning a church steeple and wood fell into his eye yesterday.   Eye is red and swollen and watering

## 2023-08-18 NOTE — ED Notes (Signed)
Pt refuses to do acuity screening. States, "my vision is perfect. It's just that I can feel something in my eye when I move it."

## 2023-09-22 ENCOUNTER — Encounter (HOSPITAL_BASED_OUTPATIENT_CLINIC_OR_DEPARTMENT_OTHER): Payer: Self-pay | Admitting: Emergency Medicine

## 2023-09-22 ENCOUNTER — Other Ambulatory Visit (HOSPITAL_BASED_OUTPATIENT_CLINIC_OR_DEPARTMENT_OTHER): Payer: Self-pay

## 2023-09-22 ENCOUNTER — Emergency Department (HOSPITAL_BASED_OUTPATIENT_CLINIC_OR_DEPARTMENT_OTHER)
Admission: EM | Admit: 2023-09-22 | Discharge: 2023-09-22 | Disposition: A | Discharge status: Home / Self Care | Payer: Self-pay | Attending: Emergency Medicine | Admitting: Emergency Medicine

## 2023-09-22 ENCOUNTER — Other Ambulatory Visit: Payer: Self-pay

## 2023-09-22 DIAGNOSIS — R300 Dysuria: Secondary | ICD-10-CM | POA: Insufficient documentation

## 2023-09-22 LAB — URINALYSIS, ROUTINE W REFLEX MICROSCOPIC
Bacteria, UA: NONE SEEN
Bilirubin Urine: NEGATIVE
Glucose, UA: NEGATIVE mg/dL
Hgb urine dipstick: NEGATIVE
Ketones, ur: NEGATIVE mg/dL
Nitrite: NEGATIVE
Protein, ur: NEGATIVE mg/dL
Specific Gravity, Urine: 1.024 (ref 1.005–1.030)
WBC, UA: >50 WBC/hpf (ref 0–5)
pH: 8 (ref 5.0–8.0)

## 2023-09-22 LAB — HIV ANTIBODY (ROUTINE TESTING W REFLEX): HIV Screen 4th Generation wRfx: NONREACTIVE

## 2023-09-22 MED ORDER — CEFTRIAXONE SODIUM 500 MG IJ SOLR
500.0000 mg | Freq: Once | INTRAMUSCULAR | Status: AC
  Administered 2023-09-22: 500 mg via INTRAMUSCULAR
  Filled 2023-09-22: qty 500

## 2023-09-22 MED ORDER — DOXYCYCLINE HYCLATE 100 MG PO TABS
100.0000 mg | ORAL_TABLET | Freq: Once | ORAL | Status: AC
  Administered 2023-09-22: 100 mg via ORAL
  Filled 2023-09-22: qty 1

## 2023-09-22 MED ORDER — ALBUTEROL SULFATE HFA 108 (90 BASE) MCG/ACT IN AERS
1.0000 | INHALATION_SPRAY | RESPIRATORY_TRACT | Status: DC
  Administered 2023-09-22: 1 via RESPIRATORY_TRACT
  Filled 2023-09-22: qty 6.7

## 2023-09-22 MED ORDER — DOXYCYCLINE HYCLATE 100 MG PO CAPS
100.0000 mg | ORAL_CAPSULE | Freq: Two times a day (BID) | ORAL | 0 refills | Status: AC
Start: 2023-09-22 — End: 2023-10-02
  Filled 2023-09-22: qty 20, 10d supply, fill #0

## 2023-09-22 MED ORDER — LIDOCAINE HCL (PF) 1 % IJ SOLN
INTRAMUSCULAR | Status: AC
  Administered 2023-09-22: 1 mL
  Filled 2023-09-22: qty 5

## 2023-09-22 MED ORDER — AEROCHAMBER PLUS FLO-VU MISC
1.0000 | Freq: Once | Status: AC
  Administered 2023-09-22: 1
  Filled 2023-09-22: qty 1

## 2023-09-22 NOTE — ED Provider Notes (Addendum)
[place] EMERGENCY DEPARTMENT AT [facility] Provider Note   CSN: [id number] Arrival date & time: 09/22/23  1146     History  Chief Complaint  Patient presents with   Dysuria    Steven [name] is a 32 y.o. male.  This is a 32 year old male who is here today for dysuria.  Patient of the symptoms began yesterday.  He did recently have unprotected sex with a new partner.  He is also asking for refill of his albuterol inhaler.  Denies any lesions or rash.  No weight loss.     Dysuria Presenting symptoms: dysuria        Home Medications Prior to Admission medications   Medication Sig Start Date End Date Taking? Authorizing Provider  doxycycline (VIBRAMYCIN) 100 MG capsule Take 1 capsule (100 mg total) by mouth 2 (two) times daily for 10 days. 09/22/23 10/02/23 Yes Anders Simmonds T, DO  acetaminophen (TYLENOL) 500 MG tablet Take 1 tablet (500 mg total) by mouth every 6 (six) hours as needed. 01/27/23   Fayrene Helper, PA-C  albuterol (PROVENTIL) (2.5 MG/3ML) 0.083% nebulizer solution Take 3 mLs (2.5 mg total) by nebulization every 6 (six) hours as needed for wheezing or shortness of breath. 09/07/22   McElwee, Lauren A, NP  albuterol (VENTOLIN HFA) 108 (90 Base) MCG/ACT inhaler Inhale 2 puffs into the lungs every 6 (six) hours as needed for wheezing or shortness of breath. 09/07/22   McElwee, Lauren A, NP  benzonatate (TESSALON) 100 MG capsule Take 1 capsule (100 mg total) by mouth every 8 (eight) hours. 01/27/23   Fayrene Helper, PA-C  budesonide-formoterol [facility]) 160-4.5 MCG/ACT inhaler Inhale 2 puffs into the lungs 2 (two) times daily. 10/20/22   Hunsucker, Lesia Sago, MD  erythromycin ophthalmic ointment Place a 1/2 inch ribbon of ointment into the lower eyelid Q 6 hours. 08/18/23   Dione Booze, MD  guaiFENesin 200 MG tablet Take 1 tablet (200 mg total) by mouth every 4 (four) hours as needed for cough or to loosen phlegm. 01/27/23   Fayrene Helper, PA-C  ipratropium-albuterol  (DUONEB) 0.5-2.5 (3) MG/3ML SOLN Take 3 mLs by nebulization every 6 (six) hours as needed for up to 30 doses. 01/28/23   Glyn Ade, MD  predniSONE (DELTASONE) 10 MG tablet Take 2 tablets (20 mg total) by mouth 2 (two) times daily with a meal. 04/11/23   Geoffery Lyons, MD  promethazine (PHENERGAN) 25 MG tablet Take 1 tablet (25 mg total) by mouth every 6 (six) hours as needed for nausea or vomiting. 09/23/17 12/25/19  Gilda Crease, MD      Allergies    Patient has no known allergies.    Review of Systems   Review of Systems  Genitourinary:  Positive for dysuria.    Physical Exam Updated Vital Signs BP 132/89 (BP Location: Right Arm)   Pulse 79   Temp 97.9 F (36.6 C) (Oral)   Resp 19   SpO2 97%  Physical Exam Vitals reviewed.  Constitutional:      Appearance: Normal appearance.  Cardiovascular:     Rate and Rhythm: Normal rate.  Pulmonary:     Effort: Pulmonary effort is normal.     Breath sounds: No wheezing.  Abdominal:     General: Abdomen is flat.     Palpations: Abdomen is soft.  Neurological:     Mental Status: He is alert.     ED Results / Procedures / Treatments   Labs (all labs ordered are listed, but only  abnormal results are displayed) Labs Reviewed  URINALYSIS, ROUTINE W REFLEX MICROSCOPIC - Abnormal; Notable for the following components:      Result Value   APPearance CLOUDY (*)    Leukocytes,Ua LARGE (*)    All other components within normal limits  HIV ANTIBODY (ROUTINE TESTING W REFLEX)  GC/CHLAMYDIA PROBE AMP ([place]) NOT AT [facility]    EKG None  Radiology No results found.  Procedures Procedures    Medications Ordered in ED Medications  albuterol (VENTOLIN HFA) 108 (90 Base) MCG/ACT inhaler 1 puff (1 puff Inhalation Given 09/22/23 1253)  cefTRIAXone (ROCEPHIN) injection 500 mg (has no administration in time range)  doxycycline (VIBRA-TABS) tablet 100 mg (has no administration in time range)  aerochamber plus with mask  device 1 each (1 each Other Given 09/22/23 1254)    ED Course/ Medical Decision Making/ A&P                                 Medical Decision Making 32 year old male here today for dysuria.  Differential diagnoses include STI, cystitis.  Plan-will empirically treat for gonorrhea chlamydia.  Patient tested.  Unfortunately, patient eloped from the emergency department prior to receiving treatment.  He will be contacted if his gonorrhea and chlamydia comes back positive.  This patient's care is complicated by his housing insecurity, lack of access to primary care.  Reassessment 1:35 PM-patient was able to be reengaged by nursing staff, we were able to prophylactically treat the patient for gonorrhea and chlamydia.  Will discharge home.  I reviewed his urinalysis, does have leukocytes.  Presumed STI.  Amount and/or Complexity of Data Reviewed Labs: ordered.  Risk Prescription drug management.           Final Clinical Impression(s) / ED Diagnoses Final diagnoses:  Dysuria    Rx / DC Orders ED Discharge Orders          Ordered    doxycycline (VIBRAMYCIN) 100 MG capsule  2 times daily        09/22/23 1332              Anders Simmonds T, DO 09/22/23 1335    Anders Simmonds T, DO 09/22/23 1337

## 2023-09-22 NOTE — Discharge Instructions (Signed)
You have been treated for gonorrhea and chlamydia.  You need to take doxycycline once in the morning once in the evening for the next 10 days.  Follow-up with your primary care doctor.

## 2023-09-22 NOTE — ED Notes (Signed)
Pt unable to provide a urine sample. Water provided. Specimen cup at bedside.

## 2023-09-22 NOTE — ED Notes (Signed)
Pt given discharge instructions and reviewed prescriptions. Opportunities given for questions. Pt verbalizes understanding. Madi Bonfiglio R, RN 

## 2023-09-22 NOTE — ED Triage Notes (Signed)
Pt reports dysuria for past few days, thinks he may have contracted an STD. Denies hematuria or frequency.

## 2023-09-25 ENCOUNTER — Telehealth: Payer: Self-pay

## 2023-09-25 LAB — GC/CHLAMYDIA PROBE AMP (~~LOC~~) NOT AT ARMC
Chlamydia: POSITIVE — AB
Comment: NEGATIVE
Comment: NORMAL
Neisseria Gonorrhea: POSITIVE — AB

## 2023-09-25 NOTE — Transitions of Care (Post Inpatient/ED Visit) (Signed)
   09/25/2023  Name: Steven [name] MRN: [id number] DOB: 06-24-1991  Today's TOC FU Call Status: Today's TOC FU Call Status:: Successful TOC FU Call Completed TOC FU Call Complete Date: 09/25/23 Patient's Name and Date of Birth confirmed.  Transition Care Management Follow-up Telephone Call Date of Discharge: 09/22/23 Discharge Facility: [facility] ([facility]) Type of Discharge: Emergency Department How have you been since you were released from the hospital?: Better Any questions or concerns?: Yes Patient Questions/Concerns:: Pt is wanting to know if he will need surgery - Reports difficulty breathing d/t asthma. Advised pt to sched an Hosp FU to be assessed for appropriate tx and referrals.  Items Reviewed: Did you receive and understand the discharge instructions provided?: Yes Medications obtained,verified, and reconciled?: No Any new allergies since your discharge?: No Do you have support at home?: Yes  Medications Reviewed Today: Medications Reviewed Today   Medications were not reviewed in this encounter     Home Care and Equipment/Supplies: Were Home Health Services Ordered?: NA Any new equipment or medical supplies ordered?: NA  Functional Questionnaire: Do you need assistance with bathing/showering or dressing?: No Do you need assistance with meal preparation?: No Do you need assistance with eating?: No Do you have difficulty maintaining continence: No Do you need assistance with getting out of bed/getting out of a chair/moving?: No Do you have difficulty managing or taking your medications?: No  Follow up appointments reviewed: PCP Follow-up appointment confirmed?: No (Pt states he will call the office to schedule.) Specialist Hospital Follow-up appointment confirmed?: No Do you need transportation to your follow-up appointment?: No Do you understand care options if your condition(s) worsen?: Yes-patient verbalized understanding  Pt advised to schedule  hospital follow up appointment to address concerns, to receive appropriate treatment and referrals. Pt states he will have to call the office to schedule, due to uncertainty of availability. Pt also informed of the OOP self-pay amount of $98.80.  SIGNATURE Arvil Persons, BSN, RN

## 2023-11-30 ENCOUNTER — Other Ambulatory Visit: Payer: Self-pay

## 2023-11-30 ENCOUNTER — Emergency Department (HOSPITAL_BASED_OUTPATIENT_CLINIC_OR_DEPARTMENT_OTHER)
Admission: EM | Admit: 2023-11-30 | Discharge: 2023-11-30 | Disposition: A | Discharge status: Home / Self Care | Payer: Self-pay | Attending: Emergency Medicine | Admitting: Emergency Medicine

## 2023-11-30 ENCOUNTER — Emergency Department (HOSPITAL_BASED_OUTPATIENT_CLINIC_OR_DEPARTMENT_OTHER): Payer: Self-pay | Admitting: Radiology

## 2023-11-30 ENCOUNTER — Emergency Department (HOSPITAL_BASED_OUTPATIENT_CLINIC_OR_DEPARTMENT_OTHER): Payer: Self-pay

## 2023-11-30 DIAGNOSIS — Z7951 Long term (current) use of inhaled steroids: Secondary | ICD-10-CM | POA: Insufficient documentation

## 2023-11-30 DIAGNOSIS — Z20822 Contact with and (suspected) exposure to covid-19: Secondary | ICD-10-CM | POA: Insufficient documentation

## 2023-11-30 DIAGNOSIS — J4541 Moderate persistent asthma with (acute) exacerbation: Secondary | ICD-10-CM

## 2023-11-30 DIAGNOSIS — Z87891 Personal history of nicotine dependence: Secondary | ICD-10-CM | POA: Insufficient documentation

## 2023-11-30 LAB — CBC WITH DIFFERENTIAL/PLATELET
Abs Immature Granulocytes: 0.01 10*3/uL (ref 0.00–0.07)
Basophils Absolute: 0 10*3/uL (ref 0.0–0.1)
Basophils Relative: 1 %
Eosinophils Absolute: 0.4 10*3/uL (ref 0.0–0.5)
Eosinophils Relative: 6 %
HCT: 45.3 % (ref 39.0–52.0)
Hemoglobin: 15.3 g/dL (ref 13.0–17.0)
Immature Granulocytes: 0 %
Lymphocytes Relative: 11 %
Lymphs Abs: 0.7 10*3/uL (ref 0.7–4.0)
MCH: 28 pg (ref 26.0–34.0)
MCHC: 33.8 g/dL (ref 30.0–36.0)
MCV: 83 fL (ref 80.0–100.0)
Monocytes Absolute: 0.5 10*3/uL (ref 0.1–1.0)
Monocytes Relative: 8 %
Neutro Abs: 5 10*3/uL (ref 1.7–7.7)
Neutrophils Relative %: 74 %
Platelets: 215 10*3/uL (ref 150–400)
RBC: 5.46 MIL/uL (ref 4.22–5.81)
RDW: 13 % (ref 11.5–15.5)
WBC: 6.6 10*3/uL (ref 4.0–10.5)
nRBC: 0 % (ref 0.0–0.2)

## 2023-11-30 LAB — BASIC METABOLIC PANEL
Anion gap: 12 (ref 5–15)
BUN: 16 mg/dL (ref 6–20)
CO2: 24 mmol/L (ref 22–32)
Calcium: 8.9 mg/dL (ref 8.9–10.3)
Chloride: 103 mmol/L (ref 98–111)
Creatinine, Ser: 0.76 mg/dL (ref 0.61–1.24)
GFR, Estimated: >60 mL/min (ref >60)
Glucose, Bld: 103 mg/dL — ABNORMAL HIGH (ref 70–99)
Potassium: 3 mmol/L — ABNORMAL LOW (ref 3.5–5.1)
Sodium: 139 mmol/L (ref 135–145)

## 2023-11-30 LAB — RESP PANEL BY RT-PCR (RSV, FLU A&B, COVID)  RVPGX2
Influenza A by PCR: NEGATIVE
Influenza B by PCR: NEGATIVE
Resp Syncytial Virus by PCR: NEGATIVE
SARS Coronavirus 2 by RT PCR: NEGATIVE

## 2023-11-30 MED ORDER — IPRATROPIUM-ALBUTEROL 0.5-2.5 (3) MG/3ML IN SOLN
3.0000 mL | Freq: Once | RESPIRATORY_TRACT | Status: AC
  Administered 2023-11-30: 3 mL via RESPIRATORY_TRACT

## 2023-11-30 MED ORDER — ALBUTEROL SULFATE HFA 108 (90 BASE) MCG/ACT IN AERS
2.0000 | INHALATION_SPRAY | Freq: Once | RESPIRATORY_TRACT | Status: AC
  Administered 2023-11-30: 2 via RESPIRATORY_TRACT
  Filled 2023-11-30: qty 6.7

## 2023-11-30 MED ORDER — POTASSIUM CHLORIDE CRYS ER 20 MEQ PO TBCR
40.0000 meq | EXTENDED_RELEASE_TABLET | Freq: Once | ORAL | Status: AC
  Administered 2023-11-30: 40 meq via ORAL
  Filled 2023-11-30: qty 2

## 2023-11-30 MED ORDER — IPRATROPIUM-ALBUTEROL 0.5-2.5 (3) MG/3ML IN SOLN
RESPIRATORY_TRACT | Status: AC
  Filled 2023-11-30: qty 3

## 2023-11-30 MED ORDER — IPRATROPIUM-ALBUTEROL 0.5-2.5 (3) MG/3ML IN SOLN
3.0000 mL | Freq: Once | RESPIRATORY_TRACT | Status: AC
  Administered 2023-11-30: 3 mL via RESPIRATORY_TRACT
  Filled 2023-11-30: qty 3

## 2023-11-30 MED ORDER — DEXAMETHASONE 4 MG PO TABS
4.0000 mg | ORAL_TABLET | Freq: Once | ORAL | Status: AC
  Administered 2023-11-30: 4 mg via ORAL
  Filled 2023-11-30: qty 1

## 2023-11-30 MED ORDER — MAGNESIUM SULFATE 50 % IJ SOLN
1.0000 g | Freq: Once | INTRAMUSCULAR | Status: AC
  Administered 2023-11-30: 1 g via INTRAVENOUS
  Filled 2023-11-30: qty 2

## 2023-11-30 NOTE — ED Provider Notes (Signed)
 [place] EMERGENCY DEPARTMENT AT [facility] Provider Note  CSN: [id number] Arrival date & time: 11/30/23 2035  Chief Complaint(s) Shortness of Breath  HPI Steven [name] is a 33 y.o. male here today for shortness of breath.  Patient states that he is a history of asthma, recently lost his insurance he has not been able to afford his medications.  He reports that he has been purchasing nebulizer solution and inhalers from people that he knows.  Patient has never had been intubated for asthma.   Past Medical History Past Medical History:  Diagnosis Date   Allergic rhinitis    Bronchitis    Hx of tobacco use, presenting hazards to health    Marijuana abuse    Severe persistent asthma    Patient Active Problem List   Diagnosis Date Noted   Enlarged thyroid  10/05/2022   Elevated blood pressure reading 10/05/2022   Asthma 09/07/2022   Non compliance with medical treatment 12/12/2016   Sinus tachycardia 09/15/2012   Marijuana abuse    Hx of tobacco use, presenting hazards to health    Home Medication(s) Prior to Admission medications   Medication Sig Start Date End Date Taking? Authorizing Provider  acetaminophen  (TYLENOL ) 500 MG tablet Take 1 tablet (500 mg total) by mouth every 6 (six) hours as needed. 01/27/23   Nivia Colon, PA-C  albuterol  (PROVENTIL ) (2.5 MG/3ML) 0.083% nebulizer solution Take 3 mLs (2.5 mg total) by nebulization every 6 (six) hours as needed for wheezing or shortness of breath. 09/07/22   McElwee, Tinnie LABOR, NP  albuterol  (VENTOLIN  HFA) 108 (90 Base) MCG/ACT inhaler Inhale 2 puffs into the lungs every 6 (six) hours as needed for wheezing or shortness of breath. 09/07/22   McElwee, Lauren A, NP  benzonatate  (TESSALON ) 100 MG capsule Take 1 capsule (100 mg total) by mouth every 8 (eight) hours. 01/27/23   Nivia Colon, PA-C  budesonide -formoterol  (SYMBICORT ) 160-4.5 MCG/ACT inhaler Inhale 2 puffs into the lungs 2 (two) times daily. 10/20/22   Hunsucker, Donnice SAUNDERS, MD  erythromycin  ophthalmic ointment Place a 1/2 inch ribbon of ointment into the lower eyelid Q 6 hours. 08/18/23   Raford Lenis, MD  guaiFENesin  200 MG tablet Take 1 tablet (200 mg total) by mouth every 4 (four) hours as needed for cough or to loosen phlegm. 01/27/23   Nivia Colon, PA-C  ipratropium-albuterol  (DUONEB) 0.5-2.5 (3) MG/3ML SOLN Take 3 mLs by nebulization every 6 (six) hours as needed for up to 30 doses. 01/28/23   Jerral Meth, MD  predniSONE  (DELTASONE ) 10 MG tablet Take 2 tablets (20 mg total) by mouth 2 (two) times daily with a meal. 04/11/23   Geroldine Berg, MD  promethazine  (PHENERGAN ) 25 MG tablet Take 1 tablet (25 mg total) by mouth every 6 (six) hours as needed for nausea or vomiting. 09/23/17 12/25/19  Haze Lonni PARAS, MD  Past Surgical History Past Surgical History:  Procedure Laterality Date   TONSILLECTOMY  2008   Family History Family History  Problem Relation Age of Onset   Asthma Mother    Cancer Mother        unsure   Asthma Brother     Social History Social History   Tobacco Use   Smoking status: Former    Types: Cigarettes   Smokeless tobacco: Never  Vaping Use   Vaping status: Never Used  Substance Use Topics   Alcohol use: Not Currently   Drug use: Not Currently    Types: Marijuana   Allergies Patient has no known allergies.  Review of Systems Review of Systems  Physical Exam Vital Signs  I have reviewed the triage vital signs BP 139/87   Pulse (!) 107   Temp 98.7 F (37.1 C) (Temporal)   Resp 16   Ht 5' 3 (1.6 m)   Wt 77.1 kg   SpO2 94%   BMI 30.11 kg/m   Physical Exam Vitals and nursing note reviewed.  HENT:     Head: Normocephalic.  Pulmonary:     Breath sounds: Wheezing present. No decreased breath sounds, rhonchi or rales.  Chest:     Chest wall: No mass or tenderness.  Skin:     General: Skin is warm.  Neurological:     General: No focal deficit present.     ED Results and Treatments Labs (all labs ordered are listed, but only abnormal results are displayed) Labs Reviewed  BASIC METABOLIC PANEL - Abnormal; Notable for the following components:      Result Value   Potassium 3.0 (*)    Glucose, Bld 103 (*)    All other components within normal limits  RESP PANEL BY RT-PCR (RSV, FLU A&B, COVID)  RVPGX2  CBC WITH DIFFERENTIAL/PLATELET                                                                                                                          Radiology DG Chest 1 View Result Date: 11/30/2023 CLINICAL DATA:  Cough EXAM: PORTABLE CHEST 1 VIEW COMPARISON:  04/11/2023 FINDINGS: The heart size and mediastinal contours are within normal limits. Both lungs are clear. The visualized skeletal structures are unremarkable. IMPRESSION: No active disease. Electronically Signed   By: Oneil Devonshire M.D.   On: 11/30/2023 21:27    Pertinent labs & imaging results that were available during my care of the patient were reviewed by me and considered in my medical decision making (see MDM for details).  Medications Ordered in ED Medications  ipratropium-albuterol  (DUONEB) 0.5-2.5 (3) MG/3ML nebulizer solution (  Not Given 11/30/23 2052)  dexamethasone  (DECADRON ) tablet 4 mg (has no administration in time range)  albuterol  (VENTOLIN  HFA) 108 (90 Base) MCG/ACT inhaler 2 puff (has no administration in time range)  ipratropium-albuterol  (DUONEB) 0.5-2.5 (3) MG/3ML nebulizer solution 3 mL (3 mLs Nebulization Given 11/30/23 2052)  ipratropium-albuterol  (DUONEB) 0.5-2.5 (3) MG/3ML nebulizer  solution 3 mL (3 mLs Nebulization Given 11/30/23 2159)  magnesium  sulfate (IV Push/IM) injection 1 g (1 g Intravenous Given 11/30/23 2206)  potassium chloride  SA (KLOR-CON  M) CR tablet 40 mEq (40 mEq Oral Given 11/30/23 2236)                                                                                                                                      Procedures Procedures  (including critical care time)  Medical Decision Making / ED Course   This patient presents to the ED for concern of shortness of breath, this involves an extensive number of treatment options, and is a complaint that carries with it a high risk of complications and morbidity.  The differential diagnosis includes asthma exacerbation, less likely pulmonary edema, viral syndrome, less likely ACS, less likely  MDM: Patient with a history of asthma, exam consistent with an asthma exacerbation.  Patient with diffuse wheezing.  He is moving good air, currently receiving albuterol .  EMS provided steroids.  Basic labs, EKG and chest x-ray ordered.  Anticipate discharge.  Reassessment 11 PM-my dependent review the patient's chest x-ray shows no consolidation.  Patient's work of breathing improved considerably with albuterol , steroids.  Briefly, patient is having O2 saturations 89 to 90%, put on 2 L nasal cannula.  Turn the patient's oxygen off, had him take some regular breaths for me and sit up straight.  O2 saturation 9495%, speaking complete sentences.  Lung sounds clear.  Will provide patient with an albuterol  inhaler here.  Will give an additional dose of Decadron  here as the patient currently does not have insurance.  Patient's potassium is a bit low, likely due to albuterol .  Did give him p.o. potassium.   Additional history obtained: -Additional history obtained from EMS -External records from outside source obtained and reviewed including: Chart review including previous notes, labs, imaging, consultation notes   Lab Tests: -I ordered, reviewed, and interpreted labs.   The pertinent results include:   Labs Reviewed  BASIC METABOLIC PANEL - Abnormal; Notable for the following components:      Result Value   Potassium 3.0 (*)    Glucose, Bld 103 (*)    All other components within normal limits  RESP PANEL BY  RT-PCR (RSV, FLU A&B, COVID)  RVPGX2  CBC WITH DIFFERENTIAL/PLATELET      EKG sinus tachycardia, no acute ischemia.  EKG Interpretation Date/Time:  Thursday November 30 2023 20:50:06 EST Ventricular Rate:  95 PR Interval:  174 QRS Duration:  86 QT Interval:  362 QTC Calculation: 456 R Axis:   81  Text Interpretation: Sinus rhythm Anteroseptal infarct, old Confirmed by Mannie Pac [phone]) on 11/30/2023 11:05:20 PM         Imaging Studies ordered: I ordered imaging studies including plain films of the chest I independently visualized and interpreted imaging. I agree with the radiologist interpretation   Medicines ordered and prescription  drug management: Meds ordered this encounter  Medications   ipratropium-albuterol  (DUONEB) 0.5-2.5 (3) MG/3ML nebulizer solution 3 mL   ipratropium-albuterol  (DUONEB) 0.5-2.5 (3) MG/3ML nebulizer solution    Knapp, Tabatha L: cabinet override   ipratropium-albuterol  (DUONEB) 0.5-2.5 (3) MG/3ML nebulizer solution 3 mL   magnesium  sulfate (IV Push/IM) injection 1 g   potassium chloride  SA (KLOR-CON  M) CR tablet 40 mEq   dexamethasone  (DECADRON ) tablet 4 mg   albuterol  (VENTOLIN  HFA) 108 (90 Base) MCG/ACT inhaler 2 puff    -I have reviewed the patients home medicines and have made adjustments as needed  Cardiac Monitoring: The patient was maintained on a cardiac monitor.  I personally viewed and interpreted the cardiac monitored which showed an underlying rhythm of: Sinus tachycardia  Social Determinants of Health:  Factors impacting patients care include: Lack of access to primary care   Reevaluation: After the interventions noted above, I reevaluated the patient and found that they have :improved  Co morbidities that complicate the patient evaluation  Past Medical History:  Diagnosis Date   Allergic rhinitis    Bronchitis    Hx of tobacco use, presenting hazards to health    Marijuana abuse    Severe persistent asthma        Dispostion: I considered admission for this patient, however patient improved with treatment, is appropriate for outpatient management.  Return precaution discussed with patient at bedside.     Final Clinical Impression(s) / ED Diagnoses Final diagnoses:  Moderate persistent asthma with exacerbation     @PCDICTATION @    Mannie Pac T, DO 11/30/23 2307

## 2023-11-30 NOTE — Discharge Instructions (Signed)
 While you are in the emergency room you had a chest x-ray that was normal.  Your blood work was normal.  You received an albuterol  inhaler.  I would like to use that every 2-4 hours as needed for shortness of breath.  Return to the emergency department if you have another episode where he feels short of breath.

## 2023-11-30 NOTE — ED Notes (Signed)
 ED Provider at bedside.

## 2023-11-30 NOTE — ED Notes (Signed)
 Pt's O2 stats remaining at 90% on room air after checking pulse ox. NAD. RT and MD notified.

## 2023-11-30 NOTE — ED Triage Notes (Signed)
 Pt bib [facility] reporting asthma exacerbation that began earlier today. 10 albuterol, 0.5 atrovent, 125 solumedrol, 2 mag given PTA with significant improvement.

## 2023-11-30 NOTE — ED Notes (Signed)
 Patient arrived to room via EMS with neb treatment already started. Wheezing throughout. VSS. Patient states he has no prescribed meds at home for his asthma and has had none since insurance ran out. However, he states he's been purchasing nebulizer solution and blue inhalers from another individual.

## 2023-12-23 ENCOUNTER — Encounter: Payer: Self-pay | Admitting: Nurse Practitioner

## 2023-12-23 ENCOUNTER — Other Ambulatory Visit: Payer: Self-pay | Admitting: Nurse Practitioner

## 2023-12-23 DIAGNOSIS — J4550 Severe persistent asthma, uncomplicated: Secondary | ICD-10-CM

## 2023-12-23 DIAGNOSIS — J4540 Moderate persistent asthma, uncomplicated: Secondary | ICD-10-CM

## 2023-12-23 MED ORDER — IPRATROPIUM-ALBUTEROL 0.5-2.5 (3) MG/3ML IN SOLN: 3.0000 mL | Freq: Four times a day (QID) | RESPIRATORY_TRACT | 0 refills | Status: AC | PRN

## 2023-12-23 MED ORDER — FLUTICASONE-SALMETEROL 100-50 MCG/ACT IN AEPB: 1.0000 | INHALATION_SPRAY | Freq: Two times a day (BID) | RESPIRATORY_TRACT | 3 refills | Status: AC

## 2023-12-23 MED ORDER — ALBUTEROL SULFATE HFA 108 (90 BASE) MCG/ACT IN AERS: 2.0000 | INHALATION_SPRAY | Freq: Four times a day (QID) | RESPIRATORY_TRACT | 2 refills | Status: AC | PRN

## 2023-12-24 ENCOUNTER — Encounter: Payer: Self-pay | Admitting: Nurse Practitioner

## 2023-12-24 DIAGNOSIS — J4550 Severe persistent asthma, uncomplicated: Secondary | ICD-10-CM

## 2023-12-24 NOTE — Progress Notes (Signed)
Nebulizers and inhaler sent last night.

## 2023-12-24 NOTE — Progress Notes (Signed)
Steven [name] was requesting to see if he could pick his albuterol inhaler up from the pharmacy today that was sent last night.

## 2024-04-05 ENCOUNTER — Other Ambulatory Visit: Payer: Self-pay

## 2024-04-05 ENCOUNTER — Encounter (HOSPITAL_BASED_OUTPATIENT_CLINIC_OR_DEPARTMENT_OTHER): Payer: Self-pay

## 2024-04-05 ENCOUNTER — Emergency Department (HOSPITAL_BASED_OUTPATIENT_CLINIC_OR_DEPARTMENT_OTHER)
Admission: EM | Admit: 2024-04-05 | Discharge: 2024-04-05 | Disposition: A | Discharge status: Home / Self Care | Attending: Emergency Medicine | Admitting: Emergency Medicine

## 2024-04-05 DIAGNOSIS — J45909 Unspecified asthma, uncomplicated: Secondary | ICD-10-CM | POA: Diagnosis not present

## 2024-04-05 DIAGNOSIS — Z113 Encounter for screening for infections with a predominantly sexual mode of transmission: Secondary | ICD-10-CM | POA: Diagnosis present

## 2024-04-05 DIAGNOSIS — R0981 Nasal congestion: Secondary | ICD-10-CM | POA: Diagnosis not present

## 2024-04-05 DIAGNOSIS — Z7951 Long term (current) use of inhaled steroids: Secondary | ICD-10-CM | POA: Diagnosis not present

## 2024-04-05 DIAGNOSIS — Z711 Person with feared health complaint in whom no diagnosis is made: Secondary | ICD-10-CM

## 2024-04-05 LAB — URINALYSIS, ROUTINE W REFLEX MICROSCOPIC
Bilirubin Urine: NEGATIVE
Glucose, UA: NEGATIVE mg/dL
Ketones, ur: NEGATIVE mg/dL
Leukocytes,Ua: NEGATIVE
Nitrite: NEGATIVE
Protein, ur: NEGATIVE mg/dL
Specific Gravity, Urine: 1.024 (ref 1.005–1.030)
pH: 6.5 (ref 5.0–8.0)

## 2024-04-05 MED ORDER — ALBUTEROL SULFATE HFA 108 (90 BASE) MCG/ACT IN AERS
2.0000 | INHALATION_SPRAY | Freq: Once | RESPIRATORY_TRACT | Status: AC
  Administered 2024-04-05: 2 via RESPIRATORY_TRACT
  Filled 2024-04-05: qty 6.7

## 2024-04-05 NOTE — ED Provider Notes (Signed)
 [place] EMERGENCY DEPARTMENT AT [facility] Provider Note   CSN: [id number] Arrival date & time: 04/05/24  0546     History  Chief Complaint  Patient presents with   STI check    Pt reports wanting to have STI check because he doesn't believe the things that his sexual partner is telling him.  Patient reports that he has no symptoms and neither does his partner.    Steven [name] is a 33 y.o. male.  Patient is a 33 year old male with history of asthma.  Patient presenting today for evaluation of STD check.  Patient is not experiencing any symptoms such as dysuria or discharge, but does report recent sexual contact with a different partner and wants to make sure everything is okay.  He is also describing some nasal congestion and is out of his inhaler.       Home Medications Prior to Admission medications   Medication Sig Start Date End Date Taking? Authorizing Provider  acetaminophen  (TYLENOL ) 500 MG tablet Take 1 tablet (500 mg total) by mouth every 6 (six) hours as needed. 01/27/23   Debbra Fairy, PA-C  albuterol  (PROVENTIL ) (2.5 MG/3ML) 0.083% nebulizer solution Take 3 mLs (2.5 mg total) by nebulization every 6 (six) hours as needed for wheezing or shortness of breath. 09/07/22   McElwee, Adolfo Hooker, NP  albuterol  (VENTOLIN  HFA) 108 (90 Base) MCG/ACT inhaler Inhale 2 puffs into the lungs every 6 (six) hours as needed for wheezing or shortness of breath. 12/23/23   Fleming, Zelda W, NP  benzonatate  (TESSALON ) 100 MG capsule Take 1 capsule (100 mg total) by mouth every 8 (eight) hours. 01/27/23   Debbra Fairy, PA-C  erythromycin  ophthalmic ointment Place a 1/2 inch ribbon of ointment into the lower eyelid Q 6 hours. 08/18/23   Alissa April, MD  fluticasone -salmeterol (ADVAIR) 100-50 MCG/ACT AEPB Inhale 1 puff into the lungs 2 (two) times daily. 12/23/23   Fleming, Zelda W, NP  guaiFENesin  200 MG tablet Take 1 tablet (200 mg total) by mouth every 4 (four) hours as needed for cough or to  loosen phlegm. 01/27/23   Debbra Fairy, PA-C  ipratropium-albuterol  (DUONEB) 0.5-2.5 (3) MG/3ML SOLN Take 3 mLs by nebulization every 6 (six) hours as needed for up to 30 doses. 12/23/23   Fleming, Zelda W, NP  predniSONE  (DELTASONE ) 10 MG tablet Take 2 tablets (20 mg total) by mouth 2 (two) times daily with a meal. 04/11/23   Orvilla Blander, MD  promethazine  (PHENERGAN ) 25 MG tablet Take 1 tablet (25 mg total) by mouth every 6 (six) hours as needed for nausea or vomiting. 09/23/17 12/25/19  Ballard Bongo, MD      Allergies    Patient has no known allergies.    Review of Systems   Review of Systems  All other systems reviewed and are negative.   Physical Exam Updated Vital Signs BP (!) 144/108 (BP Location: Right Arm)   Pulse 77   Temp 97.8 F (36.6 C) (Oral)   Resp 18   Ht 5\' 5"  (1.651 m)   Wt 77.1 kg   SpO2 97%   BMI 28.29 kg/m  Physical Exam Vitals and nursing note reviewed.  Constitutional:      General: He is not in acute distress.    Appearance: He is well-developed. He is not diaphoretic.  HENT:     Head: Normocephalic and atraumatic.  Cardiovascular:     Rate and Rhythm: Normal rate and regular rhythm.     Heart  sounds: No murmur heard.    No friction rub.  Pulmonary:     Effort: Pulmonary effort is normal. No respiratory distress.     Breath sounds: Normal breath sounds. No wheezing or rales.  Abdominal:     General: Bowel sounds are normal. There is no distension.     Palpations: Abdomen is soft.     Tenderness: There is no abdominal tenderness.  Musculoskeletal:        General: Normal range of motion.     Cervical back: Normal range of motion and neck supple.  Skin:    General: Skin is warm and dry.  Neurological:     Mental Status: He is alert and oriented to person, place, and time.     Coordination: Coordination normal.     ED Results / Procedures / Treatments   Labs (all labs ordered are listed, but only abnormal results are displayed) Labs  Reviewed - No data to display  EKG None  Radiology No results found.  Procedures Procedures    Medications Ordered in ED Medications  albuterol  (VENTOLIN  HFA) 108 (90 Base) MCG/ACT inhaler 2 puff (has no administration in time range)    ED Course/ Medical Decision Making/ A&P  GC/chlamydia pending.  Albuterol  MDI given to him.  Patient also requesting referral to ENT regarding nasal congestion which has been ongoing for quite some time.  He finds it difficult to breathe out of his nose.  Final Clinical Impression(s) / ED Diagnoses Final diagnoses:  None    Rx / DC Orders ED Discharge Orders     None         Orvilla Blander, MD 04/05/24 0630

## 2024-04-05 NOTE — Discharge Instructions (Signed)
 Follow-up the results of your testing in MyChart.  If your test is abnormal and requires further treatment, someone will contact you to make these arrangements.  Return to the ER if you experience any new and/or concerning issues.  Follow-up with ENT regarding your nasal issues.  The contact information for [facility] ear nose and throat has been provided in this discharge summary for you to call and make these arrangements.

## 2024-04-08 LAB — GC/CHLAMYDIA PROBE AMP (~~LOC~~) NOT AT ARMC
Chlamydia: NEGATIVE
Comment: NEGATIVE
Comment: NORMAL
Neisseria Gonorrhea: NEGATIVE

## 2024-05-28 ENCOUNTER — Emergency Department (HOSPITAL_BASED_OUTPATIENT_CLINIC_OR_DEPARTMENT_OTHER)
Admission: EM | Admit: 2024-05-28 | Discharge: 2024-05-28 | Disposition: A | Discharge status: Home / Self Care | Attending: Emergency Medicine | Admitting: Emergency Medicine

## 2024-05-28 ENCOUNTER — Other Ambulatory Visit: Payer: Self-pay

## 2024-05-28 DIAGNOSIS — R509 Fever, unspecified: Secondary | ICD-10-CM | POA: Diagnosis present

## 2024-05-28 DIAGNOSIS — Z7952 Long term (current) use of systemic steroids: Secondary | ICD-10-CM | POA: Diagnosis not present

## 2024-05-28 DIAGNOSIS — Z7951 Long term (current) use of inhaled steroids: Secondary | ICD-10-CM | POA: Diagnosis not present

## 2024-05-28 DIAGNOSIS — J45909 Unspecified asthma, uncomplicated: Secondary | ICD-10-CM | POA: Insufficient documentation

## 2024-05-28 DIAGNOSIS — J40 Bronchitis, not specified as acute or chronic: Secondary | ICD-10-CM

## 2024-05-28 LAB — RESP PANEL BY RT-PCR (RSV, FLU A&B, COVID)  RVPGX2
Influenza A by PCR: NEGATIVE
Influenza B by PCR: NEGATIVE
Resp Syncytial Virus by PCR: NEGATIVE
SARS Coronavirus 2 by RT PCR: NEGATIVE

## 2024-05-28 MED ORDER — DEXAMETHASONE 4 MG PO TABS
10.0000 mg | ORAL_TABLET | Freq: Once | ORAL | Status: AC
  Administered 2024-05-28: 10 mg via ORAL
  Filled 2024-05-28: qty 3

## 2024-05-28 MED ORDER — AZITHROMYCIN 250 MG PO TABS: 250.0000 mg | ORAL_TABLET | Freq: Every day | ORAL | 0 refills | Status: AC

## 2024-05-28 NOTE — ED Triage Notes (Signed)
 Pt POV requesting work note, reporting cold sx, NAD noted at this time.

## 2024-05-28 NOTE — ED Provider Notes (Signed)
 [place] EMERGENCY DEPARTMENT AT [facility] Provider Note   CSN: [id number] Arrival date & time: 05/28/24  8076     Patient presents with: Fever   Steven [name] is a 33 y.o. male.   Patient here with flulike symptoms for the last 2 days.  Congestion cough.  Nothing aches worse or better.  Low-grade fever.  He is asking for a work note.  History of asthma.  Denies any weakness numbness tingling.  No nausea vomit diarrhea.  No sputum production.  The history is provided by the patient.       Prior to Admission medications   Medication Sig Start Date End Date Taking? Authorizing Provider  azithromycin  (ZITHROMAX ) 250 MG tablet Take 1 tablet (250 mg total) by mouth daily. Take first 2 tablets together, then 1 every day until finished. 05/28/24  Yes Brandley Aldrete, DO  acetaminophen  (TYLENOL ) 500 MG tablet Take 1 tablet (500 mg total) by mouth every 6 (six) hours as needed. 01/27/23   Nivia Colon, PA-C  albuterol  (PROVENTIL ) (2.5 MG/3ML) 0.083% nebulizer solution Take 3 mLs (2.5 mg total) by nebulization every 6 (six) hours as needed for wheezing or shortness of breath. 09/07/22   McElwee, Tinnie LABOR, NP  albuterol  (VENTOLIN  HFA) 108 (90 Base) MCG/ACT inhaler Inhale 2 puffs into the lungs every 6 (six) hours as needed for wheezing or shortness of breath. 12/23/23   Fleming, Zelda W, NP  benzonatate  (TESSALON ) 100 MG capsule Take 1 capsule (100 mg total) by mouth every 8 (eight) hours. 01/27/23   Nivia Colon, PA-C  erythromycin  ophthalmic ointment Place a 1/2 inch ribbon of ointment into the lower eyelid Q 6 hours. 08/18/23   Raford Lenis, MD  fluticasone -salmeterol (ADVAIR) 100-50 MCG/ACT AEPB Inhale 1 puff into the lungs 2 (two) times daily. 12/23/23   Fleming, Zelda W, NP  guaiFENesin  200 MG tablet Take 1 tablet (200 mg total) by mouth every 4 (four) hours as needed for cough or to loosen phlegm. 01/27/23   Nivia Colon, PA-C  ipratropium-albuterol  (DUONEB) 0.5-2.5 (3) MG/3ML SOLN Take 3 mLs by  nebulization every 6 (six) hours as needed for up to 30 doses. 12/23/23   Fleming, Zelda W, NP  predniSONE  (DELTASONE ) 10 MG tablet Take 2 tablets (20 mg total) by mouth 2 (two) times daily with a meal. 04/11/23   Geroldine Berg, MD  promethazine  (PHENERGAN ) 25 MG tablet Take 1 tablet (25 mg total) by mouth every 6 (six) hours as needed for nausea or vomiting. 09/23/17 12/25/19  Haze Lonni PARAS, MD    Allergies: Patient has no known allergies.    Review of Systems  Updated Vital Signs BP (!) 149/105 (BP Location: Right Arm)   Pulse 79   Temp 98 F (36.7 C)   Resp 18   Ht 5' 8 (1.727 m)   Wt 81.6 kg   SpO2 95%   BMI 27.37 kg/m   Physical Exam Vitals and nursing note reviewed.  Constitutional:      General: He is not in acute distress.    Appearance: He is well-developed. He is not ill-appearing.  HENT:     Head: Normocephalic and atraumatic.     Nose: Congestion present.     Mouth/Throat:     Pharynx: No oropharyngeal exudate or posterior oropharyngeal erythema.  Eyes:     Extraocular Movements: Extraocular movements intact.     Conjunctiva/sclera: Conjunctivae normal.     Pupils: Pupils are equal, round, and reactive to light.  Cardiovascular:  Rate and Rhythm: Normal rate and regular rhythm.     Pulses: Normal pulses.     Heart sounds: Normal heart sounds. No murmur heard. Pulmonary:     Effort: Pulmonary effort is normal. No respiratory distress.     Breath sounds: Normal breath sounds.  Abdominal:     Palpations: Abdomen is soft.     Tenderness: There is no abdominal tenderness.  Musculoskeletal:        General: No swelling.     Cervical back: Normal range of motion and neck supple.  Skin:    General: Skin is warm and dry.     Capillary Refill: Capillary refill takes less than 2 seconds.  Neurological:     Mental Status: He is alert.  Psychiatric:        Mood and Affect: Mood normal.     (all labs ordered are listed, but only abnormal results are  displayed) Labs Reviewed  RESP PANEL BY RT-PCR (RSV, FLU A&B, COVID)  RVPGX2    EKG: None  Radiology: No results found.   Procedures   Medications Ordered in the ED  dexamethasone  (DECADRON ) tablet 10 mg (has no administration in time range)                                    Medical Decision Making Risk Prescription drug management.   Eduar Simek is here with flulike symptoms.  History of asthma.  Cough congestion.  Clear breath sounds.  No concern for asthma exacerbation.  No fever here.  Symptoms for less than 2 days.  I suspect likely a bronchitis.  COVID flu RSV testing negative.  Will treat with Z-Pak and Decadron .  Told to return if symptoms worsen including fever lasting more than 5 days or worsening pulmonary symptoms.  Discharged in good condition.  Understands return precautions.  This chart was dictated using voice recognition software.  Despite best efforts to proofread,  errors can occur which can change the documentation meaning.      Final diagnoses:  Bronchitis    ED Discharge Orders          Ordered    azithromycin  (ZITHROMAX ) 250 MG tablet  Daily        05/28/24 2154               Ruthe Cornet, DO 05/28/24 2155

## 2024-05-28 NOTE — ED Notes (Signed)
Reviewed discharge instructions, medications, and home care with pt. Pt verbalized understanding and had no further questions. Pt exited ED without complications.

## 2024-07-26 ENCOUNTER — Other Ambulatory Visit: Payer: Self-pay | Admitting: Otolaryngology

## 2024-07-26 DIAGNOSIS — E049 Nontoxic goiter, unspecified: Secondary | ICD-10-CM

## 2024-07-29 ENCOUNTER — Other Ambulatory Visit

## 2024-07-31 ENCOUNTER — Ambulatory Visit
Admission: RE | Admit: 2024-07-31 | Discharge: 2024-07-31 | Disposition: A | Discharge status: Home / Self Care | Source: Ambulatory Visit | Attending: Otolaryngology | Admitting: Otolaryngology

## 2024-07-31 DIAGNOSIS — E049 Nontoxic goiter, unspecified: Secondary | ICD-10-CM

## 2024-08-22 ENCOUNTER — Other Ambulatory Visit: Payer: Self-pay

## 2024-08-22 ENCOUNTER — Encounter (HOSPITAL_BASED_OUTPATIENT_CLINIC_OR_DEPARTMENT_OTHER): Payer: Self-pay

## 2024-08-22 ENCOUNTER — Emergency Department (HOSPITAL_BASED_OUTPATIENT_CLINIC_OR_DEPARTMENT_OTHER)
Admission: EM | Admit: 2024-08-22 | Discharge: 2024-08-23 | Disposition: A | Discharge status: Home / Self Care | Attending: Emergency Medicine | Admitting: Emergency Medicine

## 2024-08-22 DIAGNOSIS — Z202 Contact with and (suspected) exposure to infections with a predominantly sexual mode of transmission: Secondary | ICD-10-CM | POA: Diagnosis present

## 2024-08-22 MED ORDER — AZITHROMYCIN 250 MG PO TABS
1000.0000 mg | ORAL_TABLET | Freq: Once | ORAL | Status: AC
Start: 2024-08-23 — End: 2024-08-22
  Administered 2024-08-22: 1000 mg via ORAL
  Filled 2024-08-22: qty 4

## 2024-08-22 MED ORDER — CEFTRIAXONE SODIUM 500 MG IJ SOLR
500.0000 mg | Freq: Once | INTRAMUSCULAR | Status: AC
  Administered 2024-08-22: 500 mg via INTRAMUSCULAR
  Filled 2024-08-22: qty 500

## 2024-08-22 NOTE — Discharge Instructions (Signed)
 Return to the emergency department if you experience any new and/or concerning issues.

## 2024-08-22 NOTE — ED Triage Notes (Signed)
 Pt reports partner tested positive for gonorrhea. Pt denies any S/S, requesting a check up.

## 2024-08-22 NOTE — ED Provider Notes (Signed)
 [place] EMERGENCY DEPARTMENT AT [facility] Provider Note   CSN: [id number] Arrival date & time: 08/22/24  2248     Patient presents with: Exposure to STD   Steven [name] is a 33 y.o. male.   Patient is a 33 year old male presenting with complaints of STD exposure.  A sexual partner informed him she tested positive for gonorrhea and chlamydia.  He does report some dysuria, but no discharge.  No fevers or chills.  No abdominal pain.       Prior to Admission medications   Medication Sig Start Date End Date Taking? Authorizing Provider  acetaminophen  (TYLENOL ) 500 MG tablet Take 1 tablet (500 mg total) by mouth every 6 (six) hours as needed. 01/27/23   Nivia Colon, PA-C  albuterol  (PROVENTIL ) (2.5 MG/3ML) 0.083% nebulizer solution Take 3 mLs (2.5 mg total) by nebulization every 6 (six) hours as needed for wheezing or shortness of breath. 09/07/22   McElwee, Tinnie LABOR, NP  albuterol  (VENTOLIN  HFA) 108 (90 Base) MCG/ACT inhaler Inhale 2 puffs into the lungs every 6 (six) hours as needed for wheezing or shortness of breath. 12/23/23   Fleming, Zelda W, NP  azithromycin  (ZITHROMAX ) 250 MG tablet Take 1 tablet (250 mg total) by mouth daily. Take first 2 tablets together, then 1 every day until finished. 05/28/24   Curatolo, Adam, DO  benzonatate  (TESSALON ) 100 MG capsule Take 1 capsule (100 mg total) by mouth every 8 (eight) hours. 01/27/23   Nivia Colon, PA-C  erythromycin  ophthalmic ointment Place a 1/2 inch ribbon of ointment into the lower eyelid Q 6 hours. 08/18/23   Raford Lenis, MD  fluticasone -salmeterol (ADVAIR) 100-50 MCG/ACT AEPB Inhale 1 puff into the lungs 2 (two) times daily. 12/23/23   Fleming, Zelda W, NP  guaiFENesin  200 MG tablet Take 1 tablet (200 mg total) by mouth every 4 (four) hours as needed for cough or to loosen phlegm. 01/27/23   Nivia Colon, PA-C  ipratropium-albuterol  (DUONEB) 0.5-2.5 (3) MG/3ML SOLN Take 3 mLs by nebulization every 6 (six) hours as needed for up to 30  doses. 12/23/23   Fleming, Zelda W, NP  predniSONE  (DELTASONE ) 10 MG tablet Take 2 tablets (20 mg total) by mouth 2 (two) times daily with a meal. 04/11/23   Geroldine Berg, MD  promethazine  (PHENERGAN ) 25 MG tablet Take 1 tablet (25 mg total) by mouth every 6 (six) hours as needed for nausea or vomiting. 09/23/17 12/25/19  Haze Lonni PARAS, MD    Allergies: Patient has no known allergies.    Review of Systems  All other systems reviewed and are negative.   Updated Vital Signs BP (!) 147/97 (BP Location: Left Arm)   Pulse 70   Temp 97.8 F (36.6 C) (Oral)   Resp 16   Ht 5' 3 (1.6 m)   Wt 90.7 kg   SpO2 98%   BMI 35.43 kg/m   Physical Exam Vitals and nursing note reviewed.  Constitutional:      Appearance: Normal appearance.  Pulmonary:     Effort: Pulmonary effort is normal.  Skin:    General: Skin is warm and dry.  Neurological:     Mental Status: He is alert and oriented to person, place, and time.     (all labs ordered are listed, but only abnormal results are displayed) Labs Reviewed  GC/CHLAMYDIA PROBE AMP ([place]) NOT AT [facility]    EKG: None  Radiology: No results found.   Procedures   Medications Ordered in the  ED  cefTRIAXone  (ROCEPHIN ) injection 500 mg (has no administration in time range)  azithromycin  (ZITHROMAX ) tablet 1,000 mg (has no administration in time range)                                    Medical Decision Making Risk Prescription drug management.   Will treat presumptively with rocephin  and zithromax .  GC/Chlamydia pending.     Final diagnoses:  None    ED Discharge Orders     None          Geroldine Berg, MD 08/22/24 2354

## 2024-08-26 LAB — GC/CHLAMYDIA PROBE AMP (~~LOC~~) NOT AT ARMC
Chlamydia: NEGATIVE
Comment: NEGATIVE
Comment: NORMAL
Neisseria Gonorrhea: NEGATIVE

## 2024-10-28 ENCOUNTER — Encounter (HOSPITAL_BASED_OUTPATIENT_CLINIC_OR_DEPARTMENT_OTHER): Payer: Self-pay

## 2024-10-28 ENCOUNTER — Other Ambulatory Visit: Payer: Self-pay

## 2024-10-28 ENCOUNTER — Emergency Department (HOSPITAL_BASED_OUTPATIENT_CLINIC_OR_DEPARTMENT_OTHER)
Admission: EM | Admit: 2024-10-28 | Discharge: 2024-10-28 | Disposition: A | Discharge status: Home / Self Care | Attending: Emergency Medicine | Admitting: Emergency Medicine

## 2024-10-28 DIAGNOSIS — R300 Dysuria: Secondary | ICD-10-CM

## 2024-10-28 LAB — URINALYSIS, ROUTINE W REFLEX MICROSCOPIC
Bacteria, UA: NONE SEEN
Bilirubin Urine: NEGATIVE
Glucose, UA: NEGATIVE mg/dL
Ketones, ur: NEGATIVE mg/dL
Leukocytes,Ua: NEGATIVE
Nitrite: NEGATIVE
Protein, ur: NEGATIVE mg/dL
Specific Gravity, Urine: 1.02 (ref 1.005–1.030)
pH: 5.5 (ref 5.0–8.0)

## 2024-10-28 MED ORDER — DOXYCYCLINE HYCLATE 100 MG PO TABS: 100.0000 mg | ORAL_TABLET | Freq: Once | ORAL | Status: DC

## 2024-10-28 MED ORDER — LIDOCAINE HCL (PF) 1 % IJ SOLN
1.0000 mL | Freq: Once | INTRAMUSCULAR | Status: AC
  Administered 2024-10-28: 1 mL
  Filled 2024-10-28: qty 5

## 2024-10-28 MED ORDER — CEFTRIAXONE SODIUM 500 MG IJ SOLR
500.0000 mg | Freq: Once | INTRAMUSCULAR | Status: AC
  Administered 2024-10-28: 500 mg via INTRAMUSCULAR
  Filled 2024-10-28: qty 500

## 2024-10-28 MED ORDER — DOXYCYCLINE HYCLATE 100 MG PO CAPS: 100.0000 mg | ORAL_CAPSULE | Freq: Two times a day (BID) | ORAL | 0 refills | Status: DC

## 2024-10-28 NOTE — ED Triage Notes (Signed)
 Burning with urination onset yesterday, denies hematuria. Was wearing condom but it popped. Denies itching or sores.

## 2024-10-28 NOTE — Discharge Instructions (Signed)
 Please read and follow all provided instructions.  Your diagnoses today include:  1. Dysuria     Tests performed today include: Test for gonorrhea and chlamydia.  Vital signs. See below for your results today.   Medications:  For treatment of gonorrhea: You were treated with a rocephin  (shot) today.   For treatment of chlamydia: Please hold the prescription for doxycycline  until the results return.  If you test positive for chlamydia you should fill the doxycycline  and take the entire prescription.  If you test negative, you can discard the prescription. You can check your results on the internet through your MyChart. Results typically return in 48 hours.   Home care instructions:  Read educational materials contained in this packet and follow any instructions provided.   You should tell your partners about your infection and avoid having sex for one week to allow time for the medicine to work.  Sexually transmitted disease testing also available at:  [facility], [facility] [address] [place], phone [phone] or [phone]   Monday - Friday, call for an appointment  Return instructions:  Please return to the Emergency Department if you experience worsening symptoms.  Please return if you have any other emergent concerns.  Additional Information:  Your vital signs today were: BP (!) 150/102 (BP Location: Right Arm)   Temp 98.2 F (36.8 C) (Oral)   Resp 16   Ht 5' 3 (1.6 m)   Wt 81.6 kg   BMI 31.89 kg/m  If your blood pressure (BP) was elevated above 135/85 this visit, please have this repeated by your doctor within one month. --------------

## 2024-10-28 NOTE — ED Provider Notes (Signed)
 [place] EMERGENCY DEPARTMENT AT [facility] Provider Note   CSN: [id number] Arrival date & time: 10/28/24  1759     Patient presents with: Dysuria   Steven [name] is a 33 y.o. male.   Patient presents to the emergency department today for evaluation of dysuria.  This is a new problem for the patient starting about a day or so ago.  He does report sexual encounter a few days ago where the condom had broken.  He denies associated drainage or discharge.  Patient has had 2 small bumps on the inside of the penis that have been there for for a while, nontender, not red.  No fever, nausea or vomiting.  No treatments.  States that his partner stated she has been checked a couple of weeks ago when did not have any positive test or symptoms.       Prior to Admission medications   Medication Sig Start Date End Date Taking? Authorizing Provider  acetaminophen  (TYLENOL ) 500 MG tablet Take 1 tablet (500 mg total) by mouth every 6 (six) hours as needed. 01/27/23   Nivia Colon, PA-C  albuterol  (PROVENTIL ) (2.5 MG/3ML) 0.083% nebulizer solution Take 3 mLs (2.5 mg total) by nebulization every 6 (six) hours as needed for wheezing or shortness of breath. 09/07/22   McElwee, Tinnie LABOR, NP  albuterol  (VENTOLIN  HFA) 108 (90 Base) MCG/ACT inhaler Inhale 2 puffs into the lungs every 6 (six) hours as needed for wheezing or shortness of breath. 12/23/23   Fleming, Zelda W, NP  azithromycin  (ZITHROMAX ) 250 MG tablet Take 1 tablet (250 mg total) by mouth daily. Take first 2 tablets together, then 1 every day until finished. 05/28/24   Curatolo, Adam, DO  benzonatate  (TESSALON ) 100 MG capsule Take 1 capsule (100 mg total) by mouth every 8 (eight) hours. 01/27/23   Nivia Colon, PA-C  erythromycin  ophthalmic ointment Place a 1/2 inch ribbon of ointment into the lower eyelid Q 6 hours. 08/18/23   Raford Lenis, MD  fluticasone -salmeterol (ADVAIR) 100-50 MCG/ACT AEPB Inhale 1 puff into the lungs 2 (two) times daily.  12/23/23   Fleming, Zelda W, NP  guaiFENesin  200 MG tablet Take 1 tablet (200 mg total) by mouth every 4 (four) hours as needed for cough or to loosen phlegm. 01/27/23   Nivia Colon, PA-C  ipratropium-albuterol  (DUONEB) 0.5-2.5 (3) MG/3ML SOLN Take 3 mLs by nebulization every 6 (six) hours as needed for up to 30 doses. 12/23/23   Fleming, Zelda W, NP  predniSONE  (DELTASONE ) 10 MG tablet Take 2 tablets (20 mg total) by mouth 2 (two) times daily with a meal. 04/11/23   Geroldine Berg, MD  promethazine  (PHENERGAN ) 25 MG tablet Take 1 tablet (25 mg total) by mouth every 6 (six) hours as needed for nausea or vomiting. 09/23/17 12/25/19  Haze Lonni PARAS, MD    Allergies: Patient has no known allergies.    Review of Systems  Updated Vital Signs BP (!) 150/102 (BP Location: Right Arm)   Temp 98.2 F (36.8 C) (Oral)   Resp 16   Ht 5' 3 (1.6 m)   Wt 81.6 kg   BMI 31.89 kg/m   Physical Exam Vitals and nursing note reviewed.  Constitutional:      Appearance: He is well-developed.  HENT:     Head: Normocephalic and atraumatic.  Eyes:     Conjunctiva/sclera: Conjunctivae normal.  Pulmonary:     Effort: No respiratory distress.  Genitourinary:    Comments: Patient with a few scattered small  papules, approximately 1 mm diameter, pearly on the shaft of the penis.  No signs of erythema.  No vesicles. Musculoskeletal:     Cervical back: Normal range of motion and neck supple.  Skin:    General: Skin is warm and dry.  Neurological:     Mental Status: He is alert.     (all labs ordered are listed, but only abnormal results are displayed) Labs Reviewed  URINALYSIS, ROUTINE W REFLEX MICROSCOPIC  GC/CHLAMYDIA PROBE AMP ([place]) NOT AT [facility]    EKG: None  Radiology: No results found.   Procedures   Medications Ordered in the ED - No data to display  ED Course  Patient seen and examined. History obtained directly from patient.   Labs/EKG: Ordered UA, GC/chlamydia  Imaging:  None ordered  Medications/Fluids: None ordered  Most recent vital signs reviewed and are as follows: BP (!) 150/102 (BP Location: Right Arm)   Temp 98.2 F (36.8 C) (Oral)   Resp 16   Ht 5' 3 (1.6 m)   Wt 81.6 kg   BMI 31.89 kg/m   Initial impression: Dysuria  7:56 PM Reassessment performed. Patient appears stable.  Labs personally reviewed and interpreted including: Urine test, appears clear  Reviewed pertinent lab work with patient at bedside. Questions answered.  We discussed potential plans including awaiting GC/chlamydia testing to determine need for treatment versus going ahead and treating.  Patient would like treatment here tonight.  Discussed IM Rocephin  and then prescription for doxycycline .  Also offered azithromycin , but patient states that he will take the doxycycline  as prescribed.  Most current vital signs reviewed and are as follows: BP (!) 150/102 (BP Location: Right Arm)   Temp 98.2 F (36.8 C) (Oral)   Resp 16   Ht 5' 3 (1.6 m)   Wt 81.6 kg   BMI 31.89 kg/m   Plan: Discharge to home.   Prescriptions written for: Doxy to take if chlamydia positive  Other home care instructions discussed: Will test and treat for STD exposure. Patient counseled on safe sexual practices, encouraged them to avoid sexual contact for 7 days and to inform sexual partners so that they can get tested and treated as well. Patient verbalizes understanding and agrees with plan.    ED return instructions discussed: New or worsening symptoms  Follow-up instructions discussed: Patient encouraged to follow-up with their PCP in 7 days if symptoms not improving.                                  Medical Decision Making Amount and/or Complexity of Data Reviewed Labs: ordered.  Risk Prescription drug management.   Patient with dysuria after recent sexual contact.  UA without definitive signs of urethritis.  Gonorrhea and Chlamydia testing pending.  After discussion with patient,  will initiate treatment here tonight.     Final diagnoses:  Dysuria    ED Discharge Orders          Ordered    doxycycline  (VIBRAMYCIN ) 100 MG capsule  2 times daily        10/28/24 1952               Desiderio Chew, PA-C 10/28/24 1958    Freddi Hamilton, MD 10/28/24 2131

## 2024-10-29 LAB — GC/CHLAMYDIA PROBE AMP (~~LOC~~) NOT AT ARMC
Chlamydia: NEGATIVE
Comment: NEGATIVE
Comment: NORMAL
Neisseria Gonorrhea: NEGATIVE

## 2024-10-30 ENCOUNTER — Encounter (HOSPITAL_BASED_OUTPATIENT_CLINIC_OR_DEPARTMENT_OTHER): Payer: Self-pay | Admitting: Emergency Medicine

## 2024-10-30 ENCOUNTER — Emergency Department (HOSPITAL_BASED_OUTPATIENT_CLINIC_OR_DEPARTMENT_OTHER)
Admission: EM | Admit: 2024-10-30 | Discharge: 2024-10-30 | Disposition: A | Discharge status: Home / Self Care | Attending: Emergency Medicine | Admitting: Emergency Medicine

## 2024-10-30 ENCOUNTER — Other Ambulatory Visit: Payer: Self-pay

## 2024-10-30 DIAGNOSIS — R1031 Right lower quadrant pain: Secondary | ICD-10-CM | POA: Diagnosis not present

## 2024-10-30 DIAGNOSIS — R300 Dysuria: Secondary | ICD-10-CM | POA: Diagnosis present

## 2024-10-30 DIAGNOSIS — R1032 Left lower quadrant pain: Secondary | ICD-10-CM | POA: Insufficient documentation

## 2024-10-30 DIAGNOSIS — N489 Disorder of penis, unspecified: Secondary | ICD-10-CM

## 2024-10-30 DIAGNOSIS — N4889 Other specified disorders of penis: Secondary | ICD-10-CM | POA: Insufficient documentation

## 2024-10-30 MED ORDER — DOXYCYCLINE HYCLATE 100 MG PO CAPS: 100.0000 mg | ORAL_CAPSULE | Freq: Two times a day (BID) | ORAL | 0 refills | Status: AC

## 2024-10-30 MED ORDER — DOXYCYCLINE HYCLATE 100 MG PO CAPS: 100.0000 mg | ORAL_CAPSULE | Freq: Two times a day (BID) | ORAL | 0 refills | Status: DC

## 2024-10-30 NOTE — ED Provider Notes (Signed)
 [place] EMERGENCY DEPARTMENT AT [facility] Provider Note   CSN: [id number] Arrival date & time: 10/30/24  1801     Patient presents with: Dysuria   Steven [name] is a 33 y.o. male.   33 yo M with a chief complaints of dysuria.  Patient was actually seen yesterday for the same.  Was given a dose of Rocephin  and started on doxycycline .  He said he went to the pharmacy but it was closed by the time he got there.  He developed pain to bilateral groin and was still concerned about rash to his penis and so came back to be reevaluated.  No fevers.  No trauma.  He wondered if the pain in the groin was due to heavy lifting at the gym.   Dysuria Presenting symptoms: dysuria        Prior to Admission medications   Medication Sig Start Date End Date Taking? Authorizing Provider  acetaminophen  (TYLENOL ) 500 MG tablet Take 1 tablet (500 mg total) by mouth every 6 (six) hours as needed. 01/27/23   Nivia Colon, PA-C  albuterol  (PROVENTIL ) (2.5 MG/3ML) 0.083% nebulizer solution Take 3 mLs (2.5 mg total) by nebulization every 6 (six) hours as needed for wheezing or shortness of breath. 09/07/22   McElwee, Tinnie LABOR, NP  albuterol  (VENTOLIN  HFA) 108 (90 Base) MCG/ACT inhaler Inhale 2 puffs into the lungs every 6 (six) hours as needed for wheezing or shortness of breath. 12/23/23   Fleming, Zelda W, NP  azithromycin  (ZITHROMAX ) 250 MG tablet Take 1 tablet (250 mg total) by mouth daily. Take first 2 tablets together, then 1 every day until finished. 05/28/24   Curatolo, Adam, DO  benzonatate  (TESSALON ) 100 MG capsule Take 1 capsule (100 mg total) by mouth every 8 (eight) hours. 01/27/23   Nivia Colon, PA-C  doxycycline  (VIBRAMYCIN ) 100 MG capsule Take 1 capsule (100 mg total) by mouth 2 (two) times daily. One po bid x 7 days 10/30/24   Emil Share, DO  erythromycin  ophthalmic ointment Place a 1/2 inch ribbon of ointment into the lower eyelid Q 6 hours. 08/18/23   Raford Lenis, MD  fluticasone -salmeterol  (ADVAIR) 100-50 MCG/ACT AEPB Inhale 1 puff into the lungs 2 (two) times daily. 12/23/23   Fleming, Zelda W, NP  guaiFENesin  200 MG tablet Take 1 tablet (200 mg total) by mouth every 4 (four) hours as needed for cough or to loosen phlegm. 01/27/23   Nivia Colon, PA-C  ipratropium-albuterol  (DUONEB) 0.5-2.5 (3) MG/3ML SOLN Take 3 mLs by nebulization every 6 (six) hours as needed for up to 30 doses. 12/23/23   Fleming, Zelda W, NP  predniSONE  (DELTASONE ) 10 MG tablet Take 2 tablets (20 mg total) by mouth 2 (two) times daily with a meal. 04/11/23   Geroldine Berg, MD  promethazine  (PHENERGAN ) 25 MG tablet Take 1 tablet (25 mg total) by mouth every 6 (six) hours as needed for nausea or vomiting. 09/23/17 12/25/19  Haze Lonni PARAS, MD    Allergies: Patient has no known allergies.    Review of Systems  Genitourinary:  Positive for dysuria.    Updated Vital Signs BP (!) 144/92   Pulse 68   Temp 98.2 F (36.8 C) (Oral)   Resp 20   Wt 81.6 kg   SpO2 99%   BMI 31.89 kg/m   Physical Exam Vitals and nursing note reviewed.  Constitutional:      Appearance: He is well-developed.  HENT:     Head: Normocephalic and atraumatic.  Eyes:  Pupils: Pupils are equal, round, and reactive to light.  Neck:     Vascular: No JVD.  Cardiovascular:     Rate and Rhythm: Normal rate and regular rhythm.     Heart sounds: No murmur heard.    No friction rub. No gallop.  Pulmonary:     Effort: No respiratory distress.     Breath sounds: No wheezing.  Abdominal:     General: There is no distension.     Tenderness: There is no abdominal tenderness. There is no guarding or rebound.  Genitourinary:    Comments: No obvious testicular tenderness or masses.  No obvious inguinal mass.  Patient does have some pain over the right and left aspects of the pubic bone.  Perhaps there is a small amount of nodularity to the left.  No obvious change in color to the skin.  No fluctuance or induration.  Patient does have  some flesh-colored palpable areas along the right side of the shaft of his penis.   Musculoskeletal:        General: Normal range of motion.     Cervical back: Normal range of motion and neck supple.  Skin:    Coloration: Skin is not pale.     Findings: No rash.  Neurological:     Mental Status: He is alert and oriented to person, place, and time.  Psychiatric:        Behavior: Behavior normal.     (all labs ordered are listed, but only abnormal results are displayed) Labs Reviewed  SYPHILIS: RPR W/REFLEX TO RPR TITER AND TREPONEMAL ANTIBODIES, TRADITIONAL SCREENING AND DIAGNOSIS ALGORITHM  HIV ANTIBODY (ROUTINE TESTING W REFLEX)    EKG: None  Radiology: No results found.   Procedures   Medications Ordered in the ED - No data to display                                  Medical Decision Making Amount and/or Complexity of Data Reviewed Labs: ordered.  Risk Prescription drug management.   33 yo M with a chief complaint of dysuria.  Was seen yesterday and treated presumptively for STDs.  He is back today because he feels that he has developed some pain to the groin.  Perhaps this is tender lymphadenopathy.  Will make his course of doxycycline  2 weeks.  He is worried about a rash on his penis will send off testing for syphilis and HIV.  Given information to follow-up with ID for repeat exam.  8:33 PM:  I have discussed the diagnosis/risks/treatment options with the patient.  Evaluation and diagnostic testing in the emergency department does not suggest an emergent condition requiring admission or immediate intervention beyond what has been performed at this time.  They will follow up with PCP, ID. We also discussed returning to the ED immediately if new or worsening sx occur. We discussed the sx which are most concerning (e.g., sudden worsening pain, fever, inability to tolerate by mouth) that necessitate immediate return. Medications administered to the patient during their  visit and any new prescriptions provided to the patient are listed below.  Medications given during this visit Medications - No data to display   The patient appears reasonably screen and/or stabilized for discharge and I doubt any other medical condition or other [facility] requiring further screening, evaluation, or treatment in the ED at this time prior to discharge.       Final  diagnoses:  Dysuria  Lesion of penis    ED Discharge Orders          Ordered    doxycycline  (VIBRAMYCIN ) 100 MG capsule  2 times daily,   Status:  Discontinued        10/30/24 2008    doxycycline  (VIBRAMYCIN ) 100 MG capsule  2 times daily        10/30/24 2030               Emil Share, DO 10/30/24 2033

## 2024-10-30 NOTE — ED Notes (Signed)
Pt has not picked up RX>

## 2024-10-30 NOTE — Discharge Instructions (Signed)
 Follow up with ID in the clinic. Follow up with your PCP.

## 2024-10-30 NOTE — ED Triage Notes (Signed)
 Pt c/o dysuria and lower abd pain. Denies d/c Tx yesterday for same.

## 2024-10-31 LAB — SYPHILIS: RPR W/REFLEX TO RPR TITER AND TREPONEMAL ANTIBODIES, TRADITIONAL SCREENING AND DIAGNOSIS ALGORITHM: RPR Ser Ql: NONREACTIVE

## 2024-10-31 LAB — HIV ANTIBODY (ROUTINE TESTING W REFLEX): HIV Screen 4th Generation wRfx: NONREACTIVE

## 8387-07-23 DEATH — deceased
